# Patient Record
Sex: Female | Born: 1985 | Race: Black or African American | Hispanic: No | Marital: Single | Smoking: Never smoker
Health system: Southern US, Community
[De-identification: ages and names within clinical notes are randomized; demographics above are authoritative.]

## PROBLEM LIST (undated history)

## (undated) DIAGNOSIS — K219 Gastro-esophageal reflux disease without esophagitis: Secondary | ICD-10-CM

## (undated) DIAGNOSIS — O1420 HELLP syndrome (HELLP), unspecified trimester: Secondary | ICD-10-CM

## (undated) DIAGNOSIS — F32A Depression, unspecified: Secondary | ICD-10-CM

## (undated) DIAGNOSIS — T7840XA Allergy, unspecified, initial encounter: Secondary | ICD-10-CM

## (undated) DIAGNOSIS — J45909 Unspecified asthma, uncomplicated: Secondary | ICD-10-CM

## (undated) DIAGNOSIS — G4730 Sleep apnea, unspecified: Secondary | ICD-10-CM

## (undated) DIAGNOSIS — F419 Anxiety disorder, unspecified: Secondary | ICD-10-CM

## (undated) DIAGNOSIS — E282 Polycystic ovarian syndrome: Secondary | ICD-10-CM

## (undated) HISTORY — DX: Polycystic ovarian syndrome: E28.2

## (undated) HISTORY — PX: EYE SURGERY: SHX253

## (undated) HISTORY — PX: WISDOM TOOTH EXTRACTION: SHX21

## (undated) HISTORY — PX: BRAIN SURGERY: SHX531

## (undated) HISTORY — DX: Allergy, unspecified, initial encounter: T78.40XA

## (undated) HISTORY — DX: Depression, unspecified: F32.A

## (undated) HISTORY — DX: Anxiety disorder, unspecified: F41.9

---

## 2011-06-25 ENCOUNTER — Ambulatory Visit (INDEPENDENT_AMBULATORY_CARE_PROVIDER_SITE_OTHER): Payer: Managed Care, Other (non HMO) | Admitting: Family Medicine

## 2011-06-25 VITALS — BP 98/61 | HR 49 | Temp 98.8°F | Resp 16 | Ht 62.75 in | Wt 151.4 lb

## 2011-06-25 DIAGNOSIS — IMO0002 Reserved for concepts with insufficient information to code with codable children: Secondary | ICD-10-CM

## 2011-06-25 DIAGNOSIS — B9689 Other specified bacterial agents as the cause of diseases classified elsewhere: Secondary | ICD-10-CM

## 2011-06-25 DIAGNOSIS — Z3009 Encounter for other general counseling and advice on contraception: Secondary | ICD-10-CM

## 2011-06-25 DIAGNOSIS — N760 Acute vaginitis: Secondary | ICD-10-CM

## 2011-06-25 DIAGNOSIS — Z0000 Encounter for general adult medical examination without abnormal findings: Secondary | ICD-10-CM

## 2011-06-25 DIAGNOSIS — Z711 Person with feared health complaint in whom no diagnosis is made: Secondary | ICD-10-CM

## 2011-06-25 LAB — POCT URINALYSIS DIPSTICK
Bilirubin, UA: NEGATIVE
Blood, UA: NEGATIVE
Glucose, UA: NEGATIVE
Ketones, UA: NEGATIVE
Leukocytes, UA: NEGATIVE
Nitrite, UA: NEGATIVE
Protein, UA: NEGATIVE
Spec Grav, UA: 1.01
Urobilinogen, UA: 0.2
pH, UA: 5.5

## 2011-06-25 LAB — POCT WET PREP WITH KOH
Clue Cells Wet Prep HPF POC: 100
KOH Prep POC: NEGATIVE
Trichomonas, UA: NEGATIVE
Yeast Wet Prep HPF POC: NEGATIVE

## 2011-06-25 LAB — POCT UA - MICROSCOPIC ONLY
Bacteria, U Microscopic: NEGATIVE
Casts, Ur, LPF, POC: NEGATIVE
Crystals, Ur, HPF, POC: NEGATIVE
Mucus, UA: NEGATIVE
RBC, urine, microscopic: NEGATIVE
Yeast, UA: NEGATIVE

## 2011-06-25 LAB — HIV ANTIBODY (ROUTINE TESTING W REFLEX): HIV: NONREACTIVE

## 2011-06-25 MED ORDER — METRONIDAZOLE 500 MG PO TABS: ORAL_TABLET | ORAL | Status: DC

## 2011-06-25 MED ORDER — DROSPIRENONE-ETHINYL ESTRADIOL 3-0.03 MG PO TABS: 1.0000 | ORAL_TABLET | Freq: Every day | ORAL | Status: DC

## 2011-06-25 NOTE — Progress Notes (Signed)
History :Patient is here for a complete physical. She has no major acute medical complaints at this time, just felt it was time to get a physical examination. 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 She has no major acute complaints. She does want birth-control pills.  Past family and social history recorded.  Review of systems: Gen.: Mildly overweight female in no acute distress this time. HEENT negative. Cardiovascular unremarkable. Respiratory unremarkable. GI unremarkable. GU unremarkable. Musci skelter markable. Neurologic unremarkable. Dermatologic unremarkable.  Physical examination: HEENT TMs normal eyes PERRLA throat clear has a pierced tongue. Next without nodes or thyromegaly. No carotid bruits. Chest clear. Breasts are symmetrical soft without any masses. No axillary or inguinal nodes. Chest clear to auscultation. Heart regular without murmurs gallops arrhythmias. Evidence of organomegaly mass or tenderness. Extremities are without edema. Pelvic examination: Normal external genitalia. Vaginal mucosa unremarkable. Cervix appears benign. Bimanual exam reveals no adnexal uterine masses.  Assessment: Normal physical examination STD risks Oral contraception  Plan: Paps 3  Results for orders placed in visit on 06/25/11  POCT UA - MICROSCOPIC ONLY      Component Value Range   WBC, Ur, HPF, POC 0-1     RBC, urine, microscopic neg     Bacteria, U Microscopic neg     Mucus, UA neg     Epithelial cells, urine per micros 0-1     Crystals, Ur, HPF, POC neg     Casts, Ur, LPF, POC neg     Yeast, UA neg    POCT URINALYSIS DIPSTICK      Component Value Range   Color, UA yellow     Clarity, UA clear     Glucose, UA neg     Bilirubin, UA neg     Ketones, UA neg     Spec Grav, UA 1.010     Blood, UA neg     pH, UA 5.5     Protein, UA neg     Urobilinogen, UA 0.2     Nitrite, UA neg     Leukocytes, UA Negative    POCT WET PREP WITH KOH      Component Value Range   Trichomonas, UA Negative     Clue Cells Wet Prep HPF POC 100%     Epithelial Wet Prep HPF POC 6-10     Yeast Wet Prep HPF POC neg     Bacteria Wet Prep HPF POC 2+     RBC Wet Prep HPF POC 0-1     WBC Wet Prep HPF POC 3-5     KOH Prep POC Negative     v

## 2011-06-26 LAB — RPR

## 2011-06-27 ENCOUNTER — Encounter (INDEPENDENT_AMBULATORY_CARE_PROVIDER_SITE_OTHER): Payer: Managed Care, Other (non HMO)

## 2011-06-27 DIAGNOSIS — Z0389 Encounter for observation for other suspected diseases and conditions ruled out: Secondary | ICD-10-CM

## 2011-06-29 LAB — PAP IG, CT-NG, RFX HPV ASCU
Chlamydia Probe Amp: NEGATIVE
GC Probe Amp: NEGATIVE

## 2011-07-02 LAB — TB SKIN TEST
Induration: 0
TB Skin Test: NEGATIVE mm

## 2011-08-22 ENCOUNTER — Ambulatory Visit: Payer: Managed Care, Other (non HMO) | Admitting: Family Medicine

## 2011-08-22 VITALS — BP 122/75 | HR 77 | Temp 98.5°F | Resp 16 | Ht 63.0 in | Wt 146.8 lb

## 2011-08-22 DIAGNOSIS — F341 Dysthymic disorder: Secondary | ICD-10-CM

## 2011-08-22 DIAGNOSIS — R635 Abnormal weight gain: Secondary | ICD-10-CM

## 2011-08-22 DIAGNOSIS — F418 Other specified anxiety disorders: Secondary | ICD-10-CM

## 2011-08-22 LAB — TSH: TSH: 1.049 u[IU]/mL (ref 0.350–4.500)

## 2011-08-22 MED ORDER — LORAZEPAM 0.5 MG PO TABS: ORAL_TABLET | ORAL | Status: AC

## 2011-08-22 MED ORDER — SERTRALINE HCL 25 MG PO TABS: ORAL_TABLET | ORAL | Status: DC

## 2011-08-22 NOTE — Progress Notes (Signed)
[facility]:  Office Visit  Chief Complaint:  Chief Complaint  Patient presents with  . Depression    referred by therapist.  Dr. Clarisse Gouge    HPI: Brenda Molina is a 26 y.o. female who complains of: Depression x 3 months, with worsening sxs More irritable, more crying Insomnia  ( sleeps intermittently), thinking about a lot of stuff Stressors at work, at school, and with home life. She feels responsible for her  45 and 26 y/o borthers living with her mom in [place], she recently lost a older brother who helping out with that situation; she works for [employer] and is stressed at work, she is also going to [employer].  Sees Dr. Loleta Chance ( next appt q2 weeks) Anxiety component Denies prior psych issues, denies SI/HI/hallucintations   Past Medical History  Diagnosis Date  . PCOS (polycystic ovarian syndrome)    History reviewed. No pertinent past surgical history. History   Social History  . Marital Status: Single    Spouse Name: N/A    Number of Children: N/A  . Years of Education: N/A   Social History Main Topics  . Smoking status: Never Smoker   . Smokeless tobacco: None  . Alcohol Use: No  . Drug Use: No  . Sexually Active: Yes -- Female partner(s)    Birth Control/ Protection: Condom   Other Topics Concern  . None   Social History Narrative   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 Has no children. 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 Asthma Father   . Hypertension Father    No Known Allergies Prior to Admission medications   Medication Sig Start Date End Date Taking? Authorizing Provider  drospirenone-ethinyl estradiol (YAZ,GIANVI,LORYNA) 3-0.02 MG tablet Take 1 tablet by mouth daily.   Yes Historical Provider, MD     ROS: The patient denies fevers, chills, night sweats, unintentional weight loss, chest pain, palpitations, wheezing,  dyspnea on exertion, nausea, vomiting, abdominal pain, dysuria, hematuria, melena, numbness, weakness, or tingling.   All other systems have been reviewed and were otherwise negative with the exception of those mentioned in the HPI and as above.    PHYSICAL EXAM: Filed Vitals:   08/22/11 0830  BP: 122/75  Pulse: 77  Temp: 98.5 F (36.9 C)  Resp: 16   Filed Vitals:   08/22/11 0830  Height: 5\' 3"  (1.6 m)  Weight: 146 lb 12.8 oz (66.588 kg)   Body mass index is 26.00 kg/(m^2).  General: Alert, extremely tearful HEENT:  Normocephalic, atraumatic, oropharynx patent.  Cardiovascular:  Regular rate and rhythm, no rubs murmurs or gallops.  No Carotid bruits, radial pulse intact. No pedal edema.  Respiratory: Clear to auscultation bilaterally.  No wheezes, rales, or rhonchi.  No cyanosis, no use of accessory musculature GI: No organomegaly, abdomen is soft and non-tender, positive bowel sounds.  No masses. Skin: No rashes. Neurologic: Facial musculature symmetric. Psychiatric: Patient is appropriate throughout our interaction. Lymphatic: No cervical lymphadenopathy Musculoskeletal: Gait intact.   LABS:    EKG/XRAY:   Primary read interpreted by Dr. Conley Rolls at [facility].   ASSESSMENT/PLAN: Encounter Diagnoses  Name Primary?  . Depression with anxiety Yes  . Weight gain    Patient feels overwhelmed with work, life stressors.   1. Becks Depression Score 23 ( Moderate clinical depression); Zung anxiety index 51 ( minimal -moderate  anxiety) Will start patient on Zoloft 25 mg daily and then recheck in 4-6 weeks, Ativan 0.50 mg take 1/2 tab prn BID as needed for anxiety/insomnia given  C/w therapy with Dr. Shawnie Pons,   2. Check TSH  3. No thoughts of SI/Hi. Advise to go to ER if having any thoughts of hurting self/others.     Sanel Stemmer PHUONG, DO 08/22/2011 10:29 AM

## 2011-08-31 ENCOUNTER — Encounter: Payer: Self-pay | Admitting: Family Medicine

## 2011-11-30 ENCOUNTER — Ambulatory Visit (INDEPENDENT_AMBULATORY_CARE_PROVIDER_SITE_OTHER): Payer: Managed Care, Other (non HMO) | Admitting: Family Medicine

## 2011-11-30 VITALS — BP 118/84 | HR 88 | Temp 98.2°F | Resp 12 | Ht 63.5 in | Wt 150.2 lb

## 2011-11-30 DIAGNOSIS — H109 Unspecified conjunctivitis: Secondary | ICD-10-CM

## 2011-11-30 MED ORDER — TOBRAMYCIN 0.3 % OP SOLN: 1.0000 [drp] | Freq: Four times a day (QID) | OPHTHALMIC | Status: AC

## 2011-11-30 NOTE — Progress Notes (Signed)
26 yo woman with 2 days of right eye mattering and redness.  She tried OTC drops.  She works for [employer] and was up in [place], [place] visiting  No contacts.  Objective:  NAD EOMI PERLA Conjunctiva red on right with cobblestoning  Assessment: conjunctivitis OD 1. Conjunctivitis  tobramycin (TOBREX) 0.3 % ophthalmic solution

## 2011-11-30 NOTE — Patient Instructions (Addendum)
Conjunctivitis Conjunctivitis is commonly called "pink eye." Conjunctivitis can be caused by bacterial or viral infection, allergies, or injuries. There is usually redness of the lining of the eye, itching, discomfort, and sometimes discharge. There may be deposits of matter along the eyelids. A viral infection usually causes a watery discharge, while a bacterial infection causes a yellowish, thick discharge. Pink eye is very contagious and spreads by direct contact. You may be given antibiotic eyedrops as part of your treatment. Before using your eye medicine, remove all drainage from the eye by washing gently with warm water and cotton balls. Continue to use the medication until you have awakened 2 mornings in a row without discharge from the eye. Do not rub your eye. This increases the irritation and helps spread infection. Use separate towels from other household members. Wash your hands with soap and water before and after touching your eyes. Use cold compresses to reduce pain and sunglasses to relieve irritation from light. Do not wear contact lenses or wear eye makeup until the infection is gone. SEEK MEDICAL CARE IF:   Your symptoms are not better after 3 days of treatment.   You have increased pain or trouble seeing.   The outer eyelids become very red or swollen.  Document Released: 05/21/2004 Document Revised: 04/02/2011 Document Reviewed: 04/13/2005 [employer] Patient Information 2012 [employer]. 

## 2012-01-30 ENCOUNTER — Ambulatory Visit: Payer: Managed Care, Other (non HMO) | Admitting: Physician Assistant

## 2012-01-30 VITALS — BP 104/66 | HR 79 | Temp 98.1°F | Resp 16 | Ht 63.0 in | Wt 147.8 lb

## 2012-01-30 DIAGNOSIS — Z113 Encounter for screening for infections with a predominantly sexual mode of transmission: Secondary | ICD-10-CM

## 2012-01-30 DIAGNOSIS — N898 Other specified noninflammatory disorders of vagina: Secondary | ICD-10-CM

## 2012-01-30 LAB — POCT WET PREP WITH KOH
Clue Cells Wet Prep HPF POC: 100
KOH Prep POC: NEGATIVE
Trichomonas, UA: NEGATIVE
Yeast Wet Prep HPF POC: NEGATIVE

## 2012-01-30 MED ORDER — FLUCONAZOLE 150 MG PO TABS: 150.0000 mg | ORAL_TABLET | Freq: Once | ORAL | Status: DC

## 2012-01-30 MED ORDER — METRONIDAZOLE 500 MG PO TABS: 500.0000 mg | ORAL_TABLET | Freq: Two times a day (BID) | ORAL | Status: DC

## 2012-01-30 NOTE — Progress Notes (Signed)
  Subjective:    Patient ID: Brenda Molina, female    DOB: 18-Sep-1985, 26 y.o.   MRN: [id number]  HPI 26 year old female presents for STD screening and testing for possible yeast infection.  She has no specific concerns but wants to be tested. She is sexually active with 1 female partner. He is in [place] and she has returned from a recent visit.  Does have a vaginal discharge but no odor, burning, or itching.  Also concerned about a "spot" in her vaginal area that has been irritated.  Denies abdominal pain, nausea, vomiting, dysuria, or urinary frequency.  She is on OCP's for contraception.     Review of Systems  Constitutional: Negative for fever and chills.  Gastrointestinal: Negative for nausea, vomiting and abdominal pain.  Genitourinary: Positive for vaginal discharge. Negative for dysuria, urgency, vaginal bleeding, vaginal pain and pelvic pain.  Skin: Positive for rash.  All other systems reviewed and are negative.       Objective:   Physical Exam  Constitutional: She is oriented to person, place, and time. She appears well-developed and well-nourished.  HENT:  Head: Normocephalic and atraumatic.  Right Ear: External ear normal.  Left Ear: External ear normal.  Eyes: Conjunctivae normal are normal.  Neck: Normal range of motion.  Cardiovascular: Normal rate, regular rhythm and normal heart sounds.   Pulmonary/Chest: Effort normal and breath sounds normal.  Abdominal: Soft. Bowel sounds are normal. There is no tenderness. There is no rebound and no guarding.  Genitourinary: Vagina normal and uterus normal. Pelvic exam was performed with patient supine. There is no rash, tenderness or lesion on the right labia. There is no rash, tenderness or lesion on the left labia. Cervix exhibits discharge (white). Cervix exhibits no motion tenderness. Right adnexum displays no tenderness and no fullness. Left adnexum displays no tenderness and no fullness.       Small, <0.5 cm area of irritation.  No ulceration, tenderness, or discharge.    Musculoskeletal: Normal range of motion.  Lymphadenopathy:       Right: No inguinal adenopathy present.       Left: No inguinal adenopathy present.  Neurological: She is alert and oriented to person, place, and time.  Psychiatric: She has a normal mood and affect. Her behavior is normal. Judgment and thought content normal.     Results for orders placed in visit on 01/30/12  POCT WET PREP WITH KOH      Component Value Range   Trichomonas, UA Negative     Clue Cells Wet Prep HPF POC 100%     Epithelial Wet Prep HPF POC 5-15     Yeast Wet Prep HPF POC neg     Bacteria Wet Prep HPF POC 4+     RBC Wet Prep HPF POC 0-1     WBC Wet Prep HPF POC 0-5     KOH Prep POC Negative          Assessment & Plan:   1. Leukorrhea  POCT Wet Prep with KOH, GC/chlamydia probe amp, genital  2. Screening for venereal disease  GC/chlamydia probe amp, genital   Flagyl 500 mg bid x 7 days GC/CL sent Diflucan to take if needed after completion of flagyl

## 2012-02-01 LAB — GC/CHLAMYDIA PROBE AMP, GENITAL
Chlamydia, DNA Probe: NEGATIVE
GC Probe Amp, Genital: NEGATIVE

## 2012-07-18 ENCOUNTER — Other Ambulatory Visit: Payer: Self-pay | Admitting: Family Medicine

## 2012-09-22 ENCOUNTER — Ambulatory Visit (INDEPENDENT_AMBULATORY_CARE_PROVIDER_SITE_OTHER): Payer: Managed Care, Other (non HMO) | Admitting: Family Medicine

## 2012-09-22 VITALS — BP 127/71 | HR 66 | Temp 98.0°F | Resp 16 | Ht 63.0 in | Wt 157.0 lb

## 2012-09-22 DIAGNOSIS — M2142 Flat foot [pes planus] (acquired), left foot: Secondary | ICD-10-CM

## 2012-09-22 DIAGNOSIS — M2140 Flat foot [pes planus] (acquired), unspecified foot: Secondary | ICD-10-CM

## 2012-09-22 DIAGNOSIS — G5752 Tarsal tunnel syndrome, left lower limb: Secondary | ICD-10-CM

## 2012-09-22 DIAGNOSIS — G5750 Tarsal tunnel syndrome, unspecified lower limb: Secondary | ICD-10-CM

## 2012-09-22 NOTE — Patient Instructions (Signed)
Give them a call at Sports Medicine at [facility].   The address and number are:  [address] [place], [place] [id number]  It was good to meet you!  Tarsal Tunnel Syndrome with Rehab Tarsal tunnel syndrome is a condition that involves pressure (compression) on the nerve in the ankle (posterior tibial nerve) and results in pain and loss of feeling on the bottom of the foot. The nerve is usually compressed by other structures within the ankle. SYMPTOMS   Signs of nerve damage: pain, numbness, tingling, and loss of feeling along the bottom of the foot.  Pain that worsens with activity.  Feeling a lack of stability in the ankle. CAUSES  Tarsal tunnel syndrome is caused by structures within the ankle placing pressure on the nerve inside the ankle, which causes sensations in the bottom of the foot. Common sources of pressure include:  Ligament-like tissue (retinaculum) that covers the nerve area in the ankle (tarsal tunnel).  Bony spurs or bumps.  Inflamed tendons (tendonitis). RISK INCREASES WITH:  Stretched ankle ligaments, which create a loose joint.  Flat feet.  Arthritis of the ankle.  Inflammation of tendons in the foot and ankle.  Previous foot or ankle injury. PREVENTION  Warm up and stretch properly before activity.  Maintain physical fitness:  Strength, flexibility, and endurance.  Cardiovascular fitness (increases heart rate).  Wear properly fitted shoes.  Wear arch supports (orthotics), if you have flat feet.  Protect the ankle with taping, braces, or compression bandages. PROGNOSIS  If treated properly, the symptoms of tarsal tunnel syndrome usually go away with non-surgical treatment. Occasionally, surgery is necessary to free the compressed nerve.  RELATED COMPLICATIONS  Permanent nerve damage, including pain, numbness, tingling, or weakness in the ankle. TREATMENT Treatment first involves resting from any activities that aggravate the symptoms, as  well as the use of ice and medicine to reduce pain and inflammation. The use of strengthening and stretching exercises may help reduce pain from activity. Other treatments include wearing arch supports, if you have flat feet, and cross training (training in various physical activities) to reduce stress on the foot and ankle. If symptoms persist, despite non-surgical treatment, then surgery may be recommended. Surgery usually provides full relief from symptoms.  MEDICATION   If pain medicine is necessary, nonsteroidal anti-inflammatory medicines (aspirin and ibuprofen), or other minor pain relievers (acetaminophen), are often recommended.  Do not take pain medication for 7 days before surgery.  Prescription pain relievers may be given if your caregiver thinks they are necessary. Use only as directed and only as much as you need. HEAT AND COLD  Cold treatment (icing) relieves pain and reduces inflammation. Cold treatment should be applied for 10 to 15 minutes every 2 to 3 hours, and immediately after any activity that aggravates your symptoms. Use ice packs or an ice massage.  Heat treatment may be used prior to performing stretching and strengthening activities prescribed by your caregiver, physical therapist, or athletic trainer. Use a heat pack or a warm water soak. SEEK MEDICAL CARE IF:  Treatment does not seem to help, or the condition worsens.  Any medicines produce negative side effects.  Any complications from surgery occur:  Pain, numbness, or coldness in the affected foot.  Discoloration beneath the toenails (blue or gray) of the affected foot.  Signs of infections (fever, pain, inflammation, redness, or persistent bleeding). EXERCISES RANGE OF MOTION (ROM) AND STRETCHING EXERCISES - Tarsal Tunnel Syndrome (Posterior Tibial Nerve Compression) These exercises may help you  when beginning to restore activity to your injured foot. Complete these exercises with caution. Nerves are very  sensitive tissue. They must be exercised gently. Never force a motion and do not push through discomfort. Notify your caregiver of any exercises which increase your pain or worsen your symptoms. Your symptoms may go away with or without further involvement from your physician, physical therapist or athletic trainer. While completing these exercises, remember:   Restoring tissue flexibility helps normal motion to return to the joints. This allows healthier, less painful movement and activity.  An effective stretch should be held for at least 30 seconds.  A stretch should never be painful. You should only feel a gentle lengthening or release in the stretched tissue. STRETCH  Gastroc, Standing  Place hands on wall.  Extend right / left leg behind you and place a folded washcloth under the arch of your foot for support. Keep the front knee somewhat bent.  Slightly point your toes inward on your back foot.  Keeping your right / left heel on the floor and your knee straight, shift your weight toward the wall, not allowing your back to arch.  You should feel a gentle stretch in the right / left calf. Hold this position for __________ seconds. Repeat __________ times. Complete this stretch __________ times per day. STRETCH  Soleus, Standing  Place hands on wall.  Extend right / left leg behind you and place a folded washcloth under the arch of your foot for support. Keep the front knee somewhat bent.  Slightly point your toes inward on your back foot.  Keep your right / left heel on the floor, bend your back knee, and slightly shift your weight over the back leg so that you feel a gentle stretch deep in your back calf.  Hold this position for __________ seconds. Repeat __________ times. Complete this stretch __________ times per day. RANGE OF MOTION - Toe Extension, Flexion  Sit with your right / left leg crossed over your opposite knee.  Grasp your toes and gently pull them back toward  the top of your foot. You should feel a stretch on the bottom of your toes and foot.  Hold this stretch for __________ seconds.  Now, gently pull your toes toward the bottom of your foot. You should feel a stretch on the top of your toes and foot.  Hold this stretch for __________ seconds. Repeat __________ times. Complete this stretch __________ times per day.  RANGE OF MOTION - Ankle Eversion  Sit with your right / left ankle crossed over your opposite knee.  Grip your foot with your opposite hand, placing your thumb on the top of your foot and your fingers across the bottom of your foot.  Gently push your foot downward with a slight rotation so your littlest toes rise slightly toward the ceiling.  You should feel a gentle stretch on the inside of your ankle. Hold the stretch for __________ seconds. Repeat __________ times. Complete this exercise __________ times per day.  RANGE OF MOTION - Ankle Dorsiflexion, Active Assisted  Remove shoes and sit on a chair, preferably not on a carpeted surface.  Place right / left foot on the floor, directly under knee. Extend your opposite leg for support.  Keeping your heel down, slide your right / left foot back toward the chair until you feel a stretch at your ankle or calf. If you do not feel a stretch, slide your bottom forward to the edge of the chair while  still keeping your heel down.  Hold this stretch for __________ seconds. Repeat __________ times. Complete this stretch __________ times per day.  STRETCH  Hamstrings, Supine  Lie on your back. Loop a belt or towel over the ball of your right / left foot.  Straighten your right / left knee and slowly pull on the belt to raise your leg. Do not allow the right / left knee to bend. Keep your opposite leg flat on the floor.  Raise the leg until you feel a gentle stretch behind your right / left knee or thigh. Hold this position for __________ seconds. Repeat __________ times. Complete  this stretch __________ times per day.  STRETCH - Hamstrings, Doorway  Lie on your back with your right / left leg extended and resting on the wall and the opposite leg flat on the ground through the door. Initially, position your bottom farther away from the wall than the illustration shows.  Keep your right / left knee straight. If you feel a stretch behind your knee or thigh, hold this position for __________ seconds.  If you do not feel a stretch, scoot your bottom closer to the door and hold __________ seconds. Repeat __________ times. Complete this stretch __________ times per day.  STRETCH - Hamstrings, Standing  Stand or sit, and extend your right / left leg, placing your foot on a chair or foot stool  Keep a slight arch in your low back, and keep your hips straight forward.  Lead with your chest and lean forward at the waist, until you feel a gentle stretch in the back of your right / left knee or thigh. (When done correctly, this exercise requires leaning only a small distance.)  Hold this position for __________ seconds. Repeat __________ times. Complete this stretch __________ times per day. STRENGTHENING EXERCISES - Tarsal Tunnel Syndrome (Posterior Tibial Nerve Compression) These exercises may help you when beginning to restore activity to your injured foot. Your symptoms may go away with or without further involvement from your physician, physical therapist or athletic trainer. While completing these exercises, remember:   Muscles can gain both the endurance and the strength needed for everyday activities through controlled exercises.  Complete these exercises as instructed by your physician, physical therapist or athletic trainer. Increase the resistance and repetitions only as guided.  You may experience muscle soreness or fatigue, but the pain or discomfort you are trying to eliminate should never worsen during these exercises. If this pain does worsen, stop and make  certain you are following the directions exactly. If the pain is still present after adjustments, discontinue the exercise until you can discuss the trouble with your caregiver. STRENGTH - Dorsiflexors  Secure a rubber exercise band or tubing to a fixed object (table, pole) and loop the other end around your right / left foot.  Sit on the floor facing the fixed object. The band should be slightly tense when your foot is relaxed.  Slowly draw your foot back toward you, using your ankle and toes.  Hold this position for __________ seconds. Slowly release the tension in the band and return your foot to the starting position. Repeat __________ times. Complete this exercise __________ times per day.  STRENGTH - Plantar-flexors  Sit with your right / left leg extended. Holding onto both ends of a rubber exercise band or tubing, loop it around the ball of your foot. Keep a slight tension in the band.  Slowly push your toes away from you, pointing them  downward.  Hold this position for __________ seconds. Return to the starting position slowly, controlling the tension in the band. Repeat __________ times. Complete this exercise __________ times per day.  STRENGTH - Plantar-flexors, Standing  Stand with your feet shoulder width apart. Place your hands on a wall or table to steady yourself, using as little support as needed.  Keeping your weight evenly spread over the width of your feet, rise up on your toes.*  Hold this position for __________ seconds. Repeat __________ times. Complete this exercise __________ times per day.  *If this is too easy, shift your weight toward your right / left leg until you feel challenged. Ultimately, you may be asked to do this exercise while standing on your right / left foot only. STRENGTH - Towel Curls  Sit in a chair, on a non-carpeted surface.  Place your foot on a towel, keeping your heel on the floor.  Pull the towel toward your heel only by curling  your toes. Keep your heel on the floor.  If instructed by your physician, physical therapist or athletic trainer, add ____________________ at the end of the towel. Repeat __________ times. Complete this exercise __________ times per day. STRENGTH - Ankle Eversion  Secure one end of a rubber exercise band or tubing to a fixed object (table, pole). Loop the other end around your foot, just before your toes.  Place your fists between your knees. This will focus your strengthening at your ankle.  Drawing the band across your opposite foot, away from the pole, slowly pull your little toe out and up. Make sure the band is positioned to resist the entire motion.  Hold this position for __________ seconds.  Return to the starting position slowly, controlling the tension in the band. Repeat __________ times. Complete this exercise __________ times per day.  STRENGTH - Ankle Inversion  Secure one end of a rubber exercise band or tubing to a fixed object (table, pole). Loop the other end around your foot, just before your toes.  Place your fists between your knees. This will focus your strengthening at your ankle.  Slowly, pull your big toe up and in, making sure the band is positioned to resist the entire motion.  Hold this position for __________ seconds.  Return to the starting position slowly, controlling the tension in the band. Repeat __________ times. Complete this exercises __________ times per day.  Document Released: 04/13/2005 Document Revised: 07/06/2011 Document Reviewed: 07/26/2008 [facility] Patient Information 2014 [place], [place].

## 2012-09-22 NOTE — Progress Notes (Signed)
Brenda Molina is a 27 y.o. female who presents to Urgent Care today with complaints of Left foot pain and numbness:  1.  Foot pain/numbness:  Present for a little over 2 weeks.  Left foot.  She has been going through a running program for past several weeks but this is new condition for her.  She spends most of her time barefoot as she works from home and when she goes out wears "flats."  Only time wearing shoes with any support is when she runs.    Describes pain extending from medial aspect of foot, directly under arch.  Worse when walking/bearing weight.  Numbness extends from mid-foot to ball of foot.  This is also worse when bearing weight or foot eversion.    No trauma or direct injury to foot.    PMH reviewed.  Past Medical History  Diagnosis Date  . PCOS (polycystic ovarian syndrome)    No past surgical history on file.  Medications reviewed. No current outpatient prescriptions on file.   No current facility-administered medications for this visit.    ROS as above otherwise neg.  No chest pain, palpitations, SOB, Fever, Chills, Abd pain, N/V/D.   Physical Exam:  BP 127/71  Pulse 66  Temp(Src) 98 F (36.7 C) (Oral)  Resp 16  Ht 5\' 3"  (1.6 m)  Wt 157 lb (71.215 kg)  BMI 27.82 kg/m2  SpO2 99%  LMP 09/19/2012 Gen:  Alert, cooperative patient who appears stated age in no acute distress.  Vital signs reviewed. Ext:  Both arches have diminished, Left greater than right, when standing.  Other than this, Right foot is WNL.  Left foot is tender throughout most of arch.  Not tender over calcaneus.  No Achilles tenderness.  Minimally decreased sensation along arch.  Pain with inversion and eversion of foot, mostly at arch.    Assessment and Plan:  1.  Tarsal tunnel syndrome:  - REcommended to try OTC inserts and use this when wearing shoes - Recommended she use shoes as some support for her arch when walking around at home - I have provided several exercises for her - FU with  Sports Med in 2 weeks if no improvement.

## 2012-11-17 ENCOUNTER — Ambulatory Visit (INDEPENDENT_AMBULATORY_CARE_PROVIDER_SITE_OTHER): Payer: Managed Care, Other (non HMO) | Admitting: Family Medicine

## 2012-11-17 VITALS — BP 112/68 | HR 70 | Temp 97.9°F | Resp 18 | Ht 64.0 in | Wt 164.8 lb

## 2012-11-17 DIAGNOSIS — IMO0001 Reserved for inherently not codable concepts without codable children: Secondary | ICD-10-CM

## 2012-11-17 DIAGNOSIS — R61 Generalized hyperhidrosis: Secondary | ICD-10-CM

## 2012-11-17 DIAGNOSIS — Z8742 Personal history of other diseases of the female genital tract: Secondary | ICD-10-CM

## 2012-11-17 DIAGNOSIS — Z309 Encounter for contraceptive management, unspecified: Secondary | ICD-10-CM

## 2012-11-17 DIAGNOSIS — Z0000 Encounter for general adult medical examination without abnormal findings: Secondary | ICD-10-CM

## 2012-11-17 DIAGNOSIS — R635 Abnormal weight gain: Secondary | ICD-10-CM

## 2012-11-17 LAB — POCT CBC
Granulocyte percent: 62.8 %G (ref 37–80)
HCT, POC: 44 % (ref 37.7–47.9)
Hemoglobin: 14 g/dL (ref 12.2–16.2)
Lymph, poc: 2.2 (ref 0.6–3.4)
MCH, POC: 26.3 pg — AB (ref 27–31.2)
MCHC: 31.8 g/dL (ref 31.8–35.4)
MCV: 82.5 fL (ref 80–97)
MID (cbc): 0.6 (ref 0–0.9)
MPV: 9.2 fL (ref 0–99.8)
POC Granulocyte: 4.6 (ref 2–6.9)
POC LYMPH PERCENT: 29.7 %L (ref 10–50)
POC MID %: 7.5 %M (ref 0–12)
Platelet Count, POC: 204 10*3/uL (ref 142–424)
RBC: 5.33 M/uL (ref 4.04–5.48)
RDW, POC: 13.7 %
WBC: 7.4 10*3/uL (ref 4.6–10.2)

## 2012-11-17 LAB — COMPREHENSIVE METABOLIC PANEL
ALT: 21 U/L (ref 0–35)
AST: 22 U/L (ref 0–37)
Albumin: 4.1 g/dL (ref 3.5–5.2)
Alkaline Phosphatase: 61 U/L (ref 39–117)
BUN: 20 mg/dL (ref 6–23)
CO2: 24 mEq/L (ref 19–32)
Calcium: 9.4 mg/dL (ref 8.4–10.5)
Chloride: 107 mEq/L (ref 96–112)
Creat: 1.1 mg/dL (ref 0.50–1.10)
Glucose, Bld: 93 mg/dL (ref 70–99)
Potassium: 4.2 mEq/L (ref 3.5–5.3)
Sodium: 140 mEq/L (ref 135–145)
Total Bilirubin: 0.4 mg/dL (ref 0.3–1.2)
Total Protein: 6.6 g/dL (ref 6.0–8.3)

## 2012-11-17 LAB — POCT WET PREP WITH KOH
KOH Prep POC: NEGATIVE
Trichomonas, UA: NEGATIVE
Yeast Wet Prep HPF POC: NEGATIVE

## 2012-11-17 LAB — HIV ANTIBODY (ROUTINE TESTING W REFLEX): HIV: NONREACTIVE

## 2012-11-17 LAB — HEPATITIS B SURFACE ANTIBODY, QUANTITATIVE: Hepatitis B-Post: 0 m[IU]/mL

## 2012-11-17 LAB — POCT URINE PREGNANCY: Preg Test, Ur: NEGATIVE

## 2012-11-17 LAB — HEPATITIS B SURFACE ANTIGEN: Hepatitis B Surface Ag: NEGATIVE

## 2012-11-17 LAB — RPR

## 2012-11-17 LAB — HEPATITIS C ANTIBODY: HCV Ab: NEGATIVE

## 2012-11-17 LAB — TSH: TSH: 1.003 u[IU]/mL (ref 0.350–4.500)

## 2012-11-17 MED ORDER — DROSPIRENONE-ETHINYL ESTRADIOL 3-0.02 MG PO TABS: 1.0000 | ORAL_TABLET | Freq: Every day | ORAL | Status: DC

## 2012-11-17 NOTE — Patient Instructions (Addendum)
You should receive a call or letter about your lab results within the next week to 10 days.  Restart the Yaz, and if night sweats or weight changes persist - return to discuss further. Return to the clinic or go to the nearest emergency room if any of your symptoms worsen or new symptoms occur.  Keeping You Healthy  Get These Tests 1. Blood Pressure- Have your blood pressure checked once a year by your health care provider.  Normal blood pressure is 120/80. 2. Weight- Have your body mass index (BMI) calculated to screen for obesity.  BMI is measure of body fat based on height and weight.  You can also calculate your own BMI at [email or web address].west-esparza.com/. 3. Cholesterol- Have your cholesterol checked every 5 years starting at age 59 then yearly starting at age 4. 4. Chlamydia, HIV, and other sexually transmitted diseases- Get screened every year until age 33, then within three months of each new sexual provider. 5. Pap Smear- Every 1-3 years; discuss with your health care provider. 6. Mammogram- Every year starting at age 17  Take these medicines  Calcium with Vitamin D-Your body needs 1200 mg of Calcium each day and [phone] IU of Vitamin D daily.  Your body can only absorb 500 mg of Calcium at a time so Calcium must be taken in 2 or 3 divided doses throughout the day.  Multivitamin with folic acid- Once daily if it is possible for you to become pregnant.  Get these Immunizations  Gardasil-Series of three doses; prevents HPV related illness such as genital warts and cervical cancer.  Menactra-Single dose; prevents meningitis.  Tetanus shot- Every 10 years.  Flu shot-Every year.  Take these steps 1. Do not smoke-Your healthcare provider can help you quit.  For tips on how to quit go to www.smokefree.gov or call 1-800 QUITNOW. 2. Be physically active- Exercise 5 days a week for at least 30 minutes.  If you are not already physically active, start slow and gradually work up to 30 minutes  of moderate physical activity.  Examples of moderate activity include walking briskly, dancing, swimming, bicycling, etc. 3. Breast Cancer- A self breast exam every month is important for early detection of breast cancer.  For more information and instruction on self breast exams, ask your healthcare provider or [email or web address]. 4. Eat a healthy diet- Eat a variety of healthy foods such as fruits, vegetables, whole grains, low fat milk, low fat cheeses, yogurt, lean meats, poultry and fish, beans, nuts, tofu, etc.  For more information go to www. Thenutritionsource.org 5. Drink alcohol in moderation- Limit alcohol intake to one drink or less per day. Never drink and drive. 6. Depression- Your emotional health is as important as your physical health.  If you're feeling down or losing interest in things you normally enjoy please talk to your healthcare provider about being screened for depression. 7. Dental visit- Brush and floss your teeth twice daily; visit your dentist twice a year. 8. Eye doctor- Get an eye exam at least every 2 years. 9. Helmet use- Always wear a helmet when riding a bicycle, motorcycle, rollerblading or skateboarding. 10. Safe sex- If you may be exposed to sexually transmitted infections, use a condom. 11. Seat belts- Seat belts can save your live; always wear one. 12. Smoke/Carbon Monoxide detectors- These detectors need to be installed on the appropriate level of your home. Replace batteries at least once a year. 13. Skin cancer- When out in the sun please cover up and use sunscreen 15  SPF or higher. 14. Violence- If anyone is threatening or hurting you, please tell your healthcare provider.  Oral Contraception Use Oral contraceptives (OCs) are medicines taken to prevent pregnancy. OCs work by preventing the ovaries from releasing eggs. The hormones in OCs also cause the cervical mucus to thicken, preventing the sperm from entering the uterus. The  hormones also cause the uterine lining to become thin, not allowing a fertilized egg to attach to the inside of the uterus. OCs are highly effective when taken exactly as prescribed. However, OCs do not prevent sexually transmitted diseases (STDs). Safe sex practices, such as using condoms along with an OC, can help prevent STDs.  Before taking OCs, you may have a physical exam and Pap test. Your caregiver may also order blood tests if necessary. Your caregiver will make sure you are a good candidate for oral contraception. Discuss with your caregiver the possible side effects of the OC you may be prescribed. When starting an OC, it can take 2 to 3 months for the body to adjust to the changes in hormone levels in your body.  HOW TO TAKE ORAL CONTRACEPTIVES Your caregiver may advise you on how to start taking the first cycle of OCs. Otherwise, you can:  Start on day 1 of your menstrual period. You will not need any backup contraceptive protection with this start time.  Start on the first Sunday after your menstrual period or the day you get your prescription. In these cases, you will need to use backup contraceptive protection for the first 7-day cycle. After you have started taking OCs:  If you forget to take 1 pill, take it as soon as you remember. Take the next pill at the regular time.  If you miss 2 or more pills, use backup birth control until your next menstrual period starts.  If you use a 28-day pack that contains inactive pills and you miss 1 of the last 7 pills (pills with no hormones), it will not matter. Throw away the rest of the non-hormone pills and start a new pill pack. No matter which day you start the OC, you will always start a new pack on that same day of the week. Have an extra pack of OCs and a backup contraceptive method available in case you miss some pills or lose your OC pack. HOME CARE INSTRUCTIONS   Do not smoke.  Always use a condom to protect against STDs. OCs do  not protect against STDs.  Use a calendar to mark your menstrual period days.  Read the information and directions that come with your OC. Talk to your caregiver if you have questions. SEEK MEDICAL CARE IF:   You develop nausea and vomiting.  You have abnormal vaginal discharge or bleeding.  You develop a rash.  You miss your menstrual period.  You are losing your hair.  You need treatment for mood swings or depression.  You get dizzy when taking the OC.  You develop acne from taking the OC.  You become pregnant. SEEK IMMEDIATE MEDICAL CARE IF:   You develop chest pain.  You develop shortness of breath.  You have an uncontrolled or severe headache.  You develop numbness or slurred speech.  You develop visual problems.  You develop pain, redness, and swelling in the legs. Document Released: 04/02/2011 Document Revised: 07/06/2011 Document Reviewed: 04/02/2011 [facility] Patient Information 2014 [place], [place].

## 2012-11-17 NOTE — Progress Notes (Signed)
Subjective:    Patient ID: Brenda Molina, female    DOB: 10-01-1985, 27 y.o.   MRN: [id number]  HPI Brenda Molina is a 27 y.o. female  Here for physical/annual exam.  Last po 6 hours ago. Will have some lab work done through work as well.  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 Dentist - last seen few months ago. optho - none. No glasses/contacts.   Would like to restart OCP's -  Feels like hormones off - past 2 months.  Night sweats at times, sometimes chills. No wt loss - gained 10 pounds past 2 months and has been exercising almost everyday, but off exercises some last month.  Has also tried to change eating habits.  Has taken Yaz in past - did ok with this.  Out of meds past 2 months. No unprotected intercourse: Sexually active - males only. Condoms everytime.  No hx of STI's. 1 sexual partner. Pap normal in 05/2011.  LNMP July 10th.  Hx of PCOS - diagnosed few years ago, seen by OBGyn prior - recommended OCP's.   HPV vaccine - has had in past.  Meningitis vaccine - has not had. Thinks has had hep B vaccine in past.   Review of Systems  Constitutional: Positive for diaphoresis and unexpected weight change. Negative for fever and chills.  Genitourinary: Negative for difficulty urinating and menstrual problem.   13 point review of systems per patient health survey noted.  Negative other than as indicated.     Objective:   Physical Exam  Nursing note and vitals reviewed. Constitutional: She is oriented to person, place, and time. She appears well-developed and well-nourished.  HENT:  Head: Normocephalic and atraumatic.  Right Ear: External ear normal.  Left Ear: External ear normal.  Mouth/Throat: Oropharynx is clear and moist.  Eyes: Conjunctivae are normal. Pupils are equal, round, and reactive to light.  Neck: Normal range of motion. Neck supple. No thyromegaly present.  Cardiovascular: Normal rate,  regular rhythm, normal heart sounds and intact distal pulses.   No murmur heard. Pulmonary/Chest: Effort normal and breath sounds normal. No respiratory distress. She has no wheezes.  Abdominal: Soft. Bowel sounds are normal. There is no tenderness.  Genitourinary: Vagina normal and uterus normal. Pelvic exam was performed with patient supine. There is no rash or lesion on the right labia. There is no rash or lesion on the left labia. Cervix exhibits no motion tenderness and no discharge. Right adnexum displays no mass and no tenderness. Left adnexum displays no mass and no tenderness. No bleeding around the vagina. No vaginal discharge found.  Musculoskeletal: Normal range of motion. She exhibits no edema and no tenderness.  Lymphadenopathy:    She has no cervical adenopathy.       Right: No inguinal adenopathy present.       Left: No inguinal adenopathy present.  Neurological: She is alert and oriented to person, place, and time.  Skin: Skin is warm and dry. No rash noted.  Psychiatric: She has a normal mood and affect. Her behavior is normal. Thought content normal.  vision noted - 20/25 OD, 20/25 OS, 20/20 OU.   Results for orders placed in visit on 11/17/12  POCT CBC      Result Value Range   WBC 7.4  4.6 - 10.2 K/uL   Lymph, poc 2.2  0.6 - 3.4   POC LYMPH PERCENT 29.7  10 - 50 %  L   MID (cbc) 0.6  0 - 0.9   POC MID % 7.5  0 - 12 %M   POC Granulocyte 4.6  2 - 6.9   Granulocyte percent 62.8  37 - 80 %G   RBC 5.33  4.04 - 5.48 M/uL   Hemoglobin 14.0  12.2 - 16.2 g/dL   HCT, POC 16.1  09.6 - 47.9 %   MCV 82.5  80 - 97 fL   MCH, POC 26.3 (*) 27 - 31.2 pg   MCHC 31.8  31.8 - 35.4 g/dL   RDW, POC 04.5     Platelet Count, POC 204  142 - 424 K/uL   MPV 9.2  0 - 99.8 fL  POCT URINE PREGNANCY      Result Value Range   Preg Test, Ur Negative    POCT WET PREP WITH KOH      Result Value Range   Trichomonas, UA Negative     Clue Cells Wet Prep HPF POC 1-4     Epithelial Wet Prep HPF  POC 2-18     Yeast Wet Prep HPF POC NEG     Bacteria Wet Prep HPF POC 3+     RBC Wet Prep HPF POC 0-2     WBC Wet Prep HPF POC 2-6     KOH Prep POC Negative         Assessment & Plan:  Brenda Molina is a 27 y.o. female  Annual physical exam - Plan: POCT CBC, Comprehensive metabolic panel, TSH, RPR, Hepatitis B surface antigen, Hepatitis B surface antibody, Hepatitis C antibody, HIV antibody, HSV(herpes simplex vrs) 1+2 ab-IgG, Pap IG, CT/NG w/ reflex HPV when ASC-U, POCT Wet Prep with KOH, drospirenone-ethinyl estradiol (YAZ,GIANVI,LORYNA) 3-0.02 MG tablet. Anticipatory guidance as below. Can check into her last td and other immunizations.   Contraceptive counseling, History of PCOS - restarted Yaz.    Weight gain, Night sweats - off exercises past month - will check TSH. Afebrile and nonfocal exam. rtc if fever or not improving with restarting OCP's. rtc precautions.   Meds ordered this encounter  Medications  . drospirenone-ethinyl estradiol (YAZ,GIANVI,LORYNA) 3-0.02 MG tablet    Sig: Take 1 tablet by mouth daily.    Dispense:  1 Package    Refill:  11   Patient Instructions  You should receive a call or letter about your lab results within the next week to 10 days.  Restart the Yaz, and if night sweats or weight changes persist - return to discuss further. Return to the clinic or go to the nearest emergency room if any of your symptoms worsen or new symptoms occur.  Keeping You Healthy  Get These Tests 1. Blood Pressure- Have your blood pressure checked once a year by your health care provider.  Normal blood pressure is 120/80. 2. Weight- Have your body mass index (BMI) calculated to screen for obesity.  BMI is measure of body fat based on height and weight.  You can also calculate your own BMI at [email or web address].west-esparza.com/. 3. Cholesterol- Have your cholesterol checked every 5 years starting at age 57 then yearly starting at age 19. 4. Chlamydia, HIV, and other sexually  transmitted diseases- Get screened every year until age 67, then within three months of each new sexual provider. 5. Pap Smear- Every 1-3 years; discuss with your health care provider. 6. Mammogram- Every year starting at age 20  Take these medicines  Calcium with Vitamin D-Your body needs 1200 mg of Calcium each  day and [phone] IU of Vitamin D daily.  Your body can only absorb 500 mg of Calcium at a time so Calcium must be taken in 2 or 3 divided doses throughout the day.  Multivitamin with folic acid- Once daily if it is possible for you to become pregnant.  Get these Immunizations  Gardasil-Series of three doses; prevents HPV related illness such as genital warts and cervical cancer.  Menactra-Single dose; prevents meningitis.  Tetanus shot- Every 10 years.  Flu shot-Every year.  Take these steps 1. Do not smoke-Your healthcare provider can help you quit.  For tips on how to quit go to www.smokefree.gov or call 1-800 QUITNOW. 2. Be physically active- Exercise 5 days a week for at least 30 minutes.  If you are not already physically active, start slow and gradually work up to 30 minutes of moderate physical activity.  Examples of moderate activity include walking briskly, dancing, swimming, bicycling, etc. 3. Breast Cancer- A self breast exam every month is important for early detection of breast cancer.  For more information and instruction on self breast exams, ask your healthcare provider or [email or web address]. 4. Eat a healthy diet- Eat a variety of healthy foods such as fruits, vegetables, whole grains, low fat milk, low fat cheeses, yogurt, lean meats, poultry and fish, beans, nuts, tofu, etc.  For more information go to www. Thenutritionsource.org 5. Drink alcohol in moderation- Limit alcohol intake to one drink or less per day. Never drink and drive. 6. Depression- Your emotional health is as important as your physical health.  If you're feeling down or  losing interest in things you normally enjoy please talk to your healthcare provider about being screened for depression. 7. Dental visit- Brush and floss your teeth twice daily; visit your dentist twice a year. 8. Eye doctor- Get an eye exam at least every 2 years. 9. Helmet use- Always wear a helmet when riding a bicycle, motorcycle, rollerblading or skateboarding. 10. Safe sex- If you may be exposed to sexually transmitted infections, use a condom. 11. Seat belts- Seat belts can save your live; always wear one. 12. Smoke/Carbon Monoxide detectors- These detectors need to be installed on the appropriate level of your home. Replace batteries at least once a year. 13. Skin cancer- When out in the sun please cover up and use sunscreen 15 SPF or higher. 14. Violence- If anyone is threatening or hurting you, please tell your healthcare provider.  Oral Contraception Use Oral contraceptives (OCs) are medicines taken to prevent pregnancy. OCs work by preventing the ovaries from releasing eggs. The hormones in OCs also cause the cervical mucus to thicken, preventing the sperm from entering the uterus. The hormones also cause the uterine lining to become thin, not allowing a fertilized egg to attach to the inside of the uterus. OCs are highly effective when taken exactly as prescribed. However, OCs do not prevent sexually transmitted diseases (STDs). Safe sex practices, such as using condoms along with an OC, can help prevent STDs.  Before taking OCs, you may have a physical exam and Pap test. Your caregiver may also order blood tests if necessary. Your caregiver will make sure you are a good candidate for oral contraception. Discuss with your caregiver the possible side effects of the OC you may be prescribed. When starting an OC, it can take 2 to 3 months for the body to adjust to the changes in hormone levels in your body.  HOW TO TAKE ORAL CONTRACEPTIVES Your caregiver may advise you on  how to start taking  the first cycle of OCs. Otherwise, you can:  Start on day 1 of your menstrual period. You will not need any backup contraceptive protection with this start time.  Start on the first Sunday after your menstrual period or the day you get your prescription. In these cases, you will need to use backup contraceptive protection for the first 7-day cycle. After you have started taking OCs:  If you forget to take 1 pill, take it as soon as you remember. Take the next pill at the regular time.  If you miss 2 or more pills, use backup birth control until your next menstrual period starts.  If you use a 28-day pack that contains inactive pills and you miss 1 of the last 7 pills (pills with no hormones), it will not matter. Throw away the rest of the non-hormone pills and start a new pill pack. No matter which day you start the OC, you will always start a new pack on that same day of the week. Have an extra pack of OCs and a backup contraceptive method available in case you miss some pills or lose your OC pack. HOME CARE INSTRUCTIONS   Do not smoke.  Always use a condom to protect against STDs. OCs do not protect against STDs.  Use a calendar to mark your menstrual period days.  Read the information and directions that come with your OC. Talk to your caregiver if you have questions. SEEK MEDICAL CARE IF:   You develop nausea and vomiting.  You have abnormal vaginal discharge or bleeding.  You develop a rash.  You miss your menstrual period.  You are losing your hair.  You need treatment for mood swings or depression.  You get dizzy when taking the OC.  You develop acne from taking the OC.  You become pregnant. SEEK IMMEDIATE MEDICAL CARE IF:   You develop chest pain.  You develop shortness of breath.  You have an uncontrolled or severe headache.  You develop numbness or slurred speech.  You develop visual problems.  You develop pain, redness, and swelling in the  legs. Document Released: 04/02/2011 Document Revised: 07/06/2011 Document Reviewed: 04/02/2011 [facility] Patient Information 2014 [place], [place].

## 2012-11-18 LAB — PAP IG, CT-NG, RFX HPV ASCU
Chlamydia Probe Amp: NEGATIVE
GC Probe Amp: NEGATIVE

## 2012-11-18 LAB — HSV(HERPES SIMPLEX VRS) I + II AB-IGG
HSV 1 Glycoprotein G Ab, IgG: 7.79 IV — ABNORMAL HIGH
HSV 2 Glycoprotein G Ab, IgG: <0.1 IV

## 2013-01-26 ENCOUNTER — Ambulatory Visit (INDEPENDENT_AMBULATORY_CARE_PROVIDER_SITE_OTHER): Payer: Managed Care, Other (non HMO) | Admitting: Family Medicine

## 2013-01-26 VITALS — BP 102/68 | HR 71 | Temp 98.5°F | Resp 16 | Ht 64.0 in | Wt 161.0 lb

## 2013-01-26 DIAGNOSIS — J0100 Acute maxillary sinusitis, unspecified: Secondary | ICD-10-CM

## 2013-01-26 DIAGNOSIS — J309 Allergic rhinitis, unspecified: Secondary | ICD-10-CM

## 2013-01-26 MED ORDER — BENZONATATE 100 MG PO CAPS: 100.0000 mg | ORAL_CAPSULE | Freq: Three times a day (TID) | ORAL | Status: DC | PRN

## 2013-01-26 MED ORDER — AMOXICILLIN 500 MG PO CAPS: 1000.0000 mg | ORAL_CAPSULE | Freq: Two times a day (BID) | ORAL | Status: DC

## 2013-01-26 NOTE — Patient Instructions (Addendum)

## 2013-01-26 NOTE — Progress Notes (Signed)
[address]   [place], [place]  [address]   [phone]  Subjective:    Patient ID: Brenda Molina, female    DOB: August 14, 1985, 27 y.o.   MRN: [id number]  HPI This 27 y.o. female presents for evaluation of head congestion, nasal congestion, dehydration.  R sided sinsu pressure.  Onset five days ago while at the park.  No fever but +sweats.  Drinking a lot of water.  +HA.  +R ear pain.  No sore throat. +nasal congestion; taking Zyrtec.  No rhinorrhea.  +PND at night.  +coughing onset two days ago.  Humidifier.  No v/d.  No medication other than Robitussin.  +sneezing.  Works for [employer].   PCP:  Alwyn Ren  Review of Systems  Constitutional: Positive for diaphoresis. Negative for fever, chills and fatigue.  HENT: Positive for ear pain, congestion, sneezing, voice change, postnasal drip and sinus pressure. Negative for sore throat, rhinorrhea and trouble swallowing.   Respiratory: Positive for cough. Negative for shortness of breath, wheezing and stridor.   Gastrointestinal: Negative for nausea, vomiting and diarrhea.  Skin: Negative for rash.   Past Medical History  Diagnosis Date  . PCOS (polycystic ovarian syndrome)   . Anxiety   . Allergy    History reviewed. No pertinent past surgical history. No Known Allergies Current Outpatient Prescriptions on File Prior to Visit  Medication Sig Dispense Refill  . drospirenone-ethinyl estradiol (YAZ,GIANVI,LORYNA) 3-0.02 MG tablet Take 1 tablet by mouth daily.  1 Package  11   No current facility-administered medications on file prior to visit.   History   Social History  . Marital Status: Single    Spouse Name: N/A    Number of Children: N/A  . Years of Education: N/A   Occupational History  . Not on file.   Social History Main Topics  . Smoking status: Never Smoker   . Smokeless tobacco: Not on file  . Alcohol Use: No  . Drug Use: No  . Sexual Activity: Yes    Partners: Male    Birth Control/ Protection: Condom    Other Topics Concern  . Not on file   Social History Narrative   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 Has no children. 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 Constitutional: She is oriented to person, place, and time. She appears well-developed and well-nourished. No distress.  HENT:  Head: Normocephalic and atraumatic.  Right Ear: External ear normal.  Left Ear: External ear normal.  Nose: Nose normal. Right sinus exhibits no maxillary sinus tenderness and no frontal sinus tenderness. Left sinus exhibits no maxillary sinus tenderness and no frontal sinus tenderness.  Mouth/Throat: Oropharynx is clear and moist.  Eyes: Conjunctivae and EOM are normal. Pupils are equal, round, and reactive to light.  Neck: Normal range of motion. Neck supple. No thyromegaly present.  Cardiovascular: Normal rate, regular rhythm and normal heart sounds.   No murmur heard. Pulmonary/Chest: Effort normal and breath sounds normal. She has no wheezes. She has no rales.  Lymphadenopathy:    She has no cervical adenopathy.  Neurological: She is alert and oriented to person, place, and time.  Skin: Skin is warm and dry. No rash noted. She is not diaphoretic.  Psychiatric: She has a normal mood and affect. Her behavior is normal.      Assessment &  Plan:  Sinusitis, acute maxillary   1.  Acute Maxillary sinusitis:  New. Rx for Amoxicillin provided. Continue Zyrtec and Nasonex.  Rx for [employer] provided for cough.  Meds ordered this encounter  Medications  . amoxicillin (AMOXIL) 500 MG capsule    Sig: Take 2 capsules (1,000 mg total) by mouth 2 (two) times daily.    Dispense:  40 capsule    Refill:  0  . benzonatate (TESSALON) 100 MG capsule    Sig: Take 1-2 capsules (100-200 mg total) by mouth 3 (three) times daily as needed for cough.    Dispense:  40 capsule     Refill:  0

## 2013-04-30 ENCOUNTER — Ambulatory Visit: Payer: Managed Care, Other (non HMO) | Admitting: Physician Assistant

## 2013-04-30 VITALS — BP 120/80 | HR 70 | Temp 98.6°F | Resp 16 | Ht 64.0 in | Wt 160.0 lb

## 2013-04-30 DIAGNOSIS — J309 Allergic rhinitis, unspecified: Secondary | ICD-10-CM

## 2013-04-30 DIAGNOSIS — J019 Acute sinusitis, unspecified: Secondary | ICD-10-CM

## 2013-04-30 MED ORDER — AMOXICILLIN 875 MG PO TABS: 875.0000 mg | ORAL_TABLET | Freq: Two times a day (BID) | ORAL | Status: DC

## 2013-04-30 MED ORDER — MOMETASONE FUROATE 50 MCG/ACT NA SUSP: 2.0000 | Freq: Every day | NASAL | Status: DC

## 2013-04-30 NOTE — Progress Notes (Signed)
   Subjective:    Patient ID: Brenda BarerShavonda D Tatar, female    DOB: 1986-01-01, [age] y.o.   MRN: [id number]  HPI 28 year old female presents for evaluation of 1 week history of nasal congestion, PND, rhinorrhea, cough in the morning, sinus pressure, and thick nasal discharge.  Patient has hx of sinus infections and allergic rhinitis in the past. Has prescription for nasonex that she uses prn - admits this does help although she is now out of this. She has also been taking Zyrtec-D which also helps.  Has had chills and subjective fever.  Afebrile today.  Patient is otherwise doing well with no other concerns today.     Review of Systems  Constitutional: Positive for chills. Negative for fever.  HENT: Positive for congestion, postnasal drip, rhinorrhea, sinus pressure and sore throat. Negative for ear pain.   Respiratory: Positive for cough. Negative for chest tightness, shortness of breath and wheezing.   Cardiovascular: Negative for chest pain.  Gastrointestinal: Negative for nausea, vomiting and abdominal pain.  Neurological: Negative for dizziness and headaches.       Objective:   Physical Exam  Constitutional: She is oriented to person, place, and time. She appears well-developed and well-nourished.  HENT:  Head: Normocephalic and atraumatic.  Right Ear: Hearing, tympanic membrane, external ear and ear canal normal.  Left Ear: Hearing, tympanic membrane, external ear and ear canal normal.  Mouth/Throat: Uvula is midline, oropharynx is clear and moist and mucous membranes are normal. No oropharyngeal exudate.  Eyes: Conjunctivae are normal.  Neck: Normal range of motion. Neck supple.  Cardiovascular: Normal rate, regular rhythm and normal heart sounds.   Pulmonary/Chest: Effort normal and breath sounds normal.  Lymphadenopathy:    She has no cervical adenopathy.  Neurological: She is alert and oriented to person, place, and time.  Psychiatric: She has a normal mood and affect. Her  behavior is normal. Judgment and thought content normal.          Assessment & Plan:  Sinusitis, acute - Plan: amoxicillin (AMOXIL) 875 MG tablet  Allergic rhinitis - Plan: mometasone (NASONEX) 50 MCG/ACT nasal spray  Will go ahead and treat with amoxicillin 875 mg bid x 10 days Refilled Nasonex to use twice daily as needed Continue Zyrtec daily Follow up if symptoms worsen or fail to improve.

## 2013-07-01 ENCOUNTER — Ambulatory Visit: Payer: Managed Care, Other (non HMO) | Admitting: Family Medicine

## 2013-07-01 VITALS — BP 110/70 | HR 73 | Temp 98.0°F | Resp 16 | Ht 63.0 in | Wt 163.0 lb

## 2013-07-01 DIAGNOSIS — J029 Acute pharyngitis, unspecified: Secondary | ICD-10-CM

## 2013-07-01 DIAGNOSIS — J019 Acute sinusitis, unspecified: Secondary | ICD-10-CM

## 2013-07-01 LAB — POCT RAPID STREP A (OFFICE): Rapid Strep A Screen: NEGATIVE

## 2013-07-01 MED ORDER — AMOXICILLIN 875 MG PO TABS: 875.0000 mg | ORAL_TABLET | Freq: Two times a day (BID) | ORAL | Status: DC

## 2013-07-01 NOTE — Patient Instructions (Signed)
Drink plenty of fluids  Get enough rest  Take amoxicillin one twice daily. This can be discontinued and the remainder discarded if the strep test is negative.  Take Tylenol or ibuprofen for the discomfort, and Claritin-D or Allegra-D for the congestion if he gets worse.  Return if needed

## 2013-07-01 NOTE — Progress Notes (Signed)
Subjective: 28 year old lady with a three-day history of a sore throat she hurts most of the left side of the throat and up into the face some and down to the neck. She is a nonsmoker. Has not had a lot of strep. She had some sweats in the night and moderate been febrile. Lesser cycle was about every night. She is on contraception. She has not had much in way of cough.    Objective: Pleasant young lady who doesn't fair well. Her TMs are normal. Throat is erythematous, is on the left side with some swelling left tonsil. Strep test was taken as well as a backup culture if needed. Neck supple but has moderately large node on the left side submandibular angle. Chest is clear. Heart regular without murmurs.  Assessment: Pharyngitis   Plan: Check strep test  Results for orders placed in visit on 07/01/13  POCT RAPID STREP A (OFFICE)      Result Value Ref Range   Rapid Strep A Screen Negative  Negative   Will treat symptomatically but will also place on antibiotic until tests come back from the culture.

## 2013-07-03 LAB — CULTURE, GROUP A STREP: Organism ID, Bacteria: NORMAL

## 2013-08-23 ENCOUNTER — Ambulatory Visit: Payer: Managed Care, Other (non HMO) | Admitting: Family Medicine

## 2013-08-23 VITALS — BP 122/76 | HR 98 | Temp 98.0°F | Resp 17 | Ht 62.5 in | Wt 160.0 lb

## 2013-08-23 DIAGNOSIS — F329 Major depressive disorder, single episode, unspecified: Secondary | ICD-10-CM

## 2013-08-23 DIAGNOSIS — Z0271 Encounter for disability determination: Secondary | ICD-10-CM

## 2013-08-23 DIAGNOSIS — G479 Sleep disorder, unspecified: Secondary | ICD-10-CM

## 2013-08-23 DIAGNOSIS — F411 Generalized anxiety disorder: Secondary | ICD-10-CM

## 2013-08-23 DIAGNOSIS — F3289 Other specified depressive episodes: Secondary | ICD-10-CM

## 2013-08-23 DIAGNOSIS — F32A Depression, unspecified: Secondary | ICD-10-CM

## 2013-08-23 MED ORDER — CLONAZEPAM 0.5 MG PO TABS: 0.5000 mg | ORAL_TABLET | Freq: Two times a day (BID) | ORAL | Status: DC | PRN

## 2013-08-23 MED ORDER — SERTRALINE HCL 50 MG PO TABS: 50.0000 mg | ORAL_TABLET | Freq: Every day | ORAL | Status: DC

## 2013-08-23 NOTE — Patient Instructions (Addendum)
I am beginning you on: Zoloft since sertraline) 50 mg one daily. Take in the morning. It will take being on this for about 2 weeks before you say "Hey, I am feeling better." Clonazepam 0.5 mg one twice daily for anxiety. It will help with sleep also  Continue regular exercise  Avoid regular alcohol use  Avoid transient relationships but tried to build some worthwhile relationships with people he can talk with.   If you ever have suicidal thoughts that you're going on get help immediately. Either come here, go to the emergency room, call someone, or reach out in some fashion to get help or call 911.  Advise regular involvement in a spiritual community. Suggest [facility] which is on [address] [place]  Return here at any time for a followup visit. However I will be out of the country until early June, at which time I would like to have you come in and see me. Call the office late May or early June to find out in a little be here.   Brenda BongoAaron Molina:  [phone]   Brenda Molina:  " Brenda Molina  [phone], [phone] Brenda Molina "

## 2013-08-23 NOTE — Progress Notes (Signed)
Subjective: Patient is here because she had to leave her job this morning with anxiety and depression. She has been struggling a lot for a long time, but lately it's been very bad. She cries much of the time. She isn't sleeping well. She drinks some wine to help him get to sleep. She is working for [employer] from home. She also is a Consulting civil engineerstudent at [employer]. 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 She does not smoke or use drugs. She is on birth pills but not currently sexually active and not pregnant. She went to a church this past Sunday sticking to try and get back into some kind fellowship. She really doesn't have any close friends. Her family is not very approachable since they are always needy and dependent on her. The friends she does have have children and lives a their own which makes it hard for her to connect. She has had suicidal thoughts at times, but is not dwelling on doing something to herself.  Objective: Physical exam not done. Talk with the patient for a long time. She is very tearful. There  Assessment: Depression Anxiety Sleep disturbance  Plan: Zoloft Sertraline See instructions written out.  Return at any time if worse or if any suicidal ideation. She is to see me in early June when I return from my trip.

## 2014-01-17 ENCOUNTER — Ambulatory Visit (INDEPENDENT_AMBULATORY_CARE_PROVIDER_SITE_OTHER): Payer: Managed Care, Other (non HMO) | Admitting: Family Medicine

## 2014-01-17 VITALS — BP 118/68 | HR 88 | Temp 97.6°F | Resp 17 | Ht 63.0 in | Wt 169.0 lb

## 2014-01-17 DIAGNOSIS — Z23 Encounter for immunization: Secondary | ICD-10-CM

## 2014-01-17 DIAGNOSIS — F329 Major depressive disorder, single episode, unspecified: Secondary | ICD-10-CM

## 2014-01-17 DIAGNOSIS — Z124 Encounter for screening for malignant neoplasm of cervix: Secondary | ICD-10-CM

## 2014-01-17 DIAGNOSIS — F3289 Other specified depressive episodes: Secondary | ICD-10-CM

## 2014-01-17 DIAGNOSIS — Z113 Encounter for screening for infections with a predominantly sexual mode of transmission: Secondary | ICD-10-CM

## 2014-01-17 DIAGNOSIS — Z13220 Encounter for screening for lipoid disorders: Secondary | ICD-10-CM

## 2014-01-17 DIAGNOSIS — Z3009 Encounter for other general counseling and advice on contraception: Secondary | ICD-10-CM

## 2014-01-17 DIAGNOSIS — Z0000 Encounter for general adult medical examination without abnormal findings: Secondary | ICD-10-CM

## 2014-01-17 DIAGNOSIS — F32A Depression, unspecified: Secondary | ICD-10-CM

## 2014-01-17 LAB — LIPID PANEL
Cholesterol: 214 mg/dL — ABNORMAL HIGH (ref 0–200)
HDL: 77 mg/dL (ref >39)
LDL Cholesterol: 126 mg/dL — ABNORMAL HIGH (ref 0–99)
Total CHOL/HDL Ratio: 2.8 Ratio
Triglycerides: 55 mg/dL (ref <150)
VLDL: 11 mg/dL (ref 0–40)

## 2014-01-17 MED ORDER — DROSPIRENONE-ETHINYL ESTRADIOL 3-0.02 MG PO TABS: 1.0000 | ORAL_TABLET | Freq: Every day | ORAL | Status: DC

## 2014-01-17 MED ORDER — CLONAZEPAM 1 MG PO TABS: 1.0000 mg | ORAL_TABLET | Freq: Two times a day (BID) | ORAL | Status: DC

## 2014-01-17 MED ORDER — SERTRALINE HCL 50 MG PO TABS: 50.0000 mg | ORAL_TABLET | Freq: Every day | ORAL | Status: DC

## 2014-01-17 NOTE — Patient Instructions (Addendum)
Good to see you today- I will be in touch with your labs.  Consider having the hepatitis B vaccine series at your convenience.    Influenza Virus Vaccine injection (Fluarix) What is this medicine? INFLUENZA VIRUS VACCINE (in floo EN zuh VAHY ruhs vak SEEN) helps to reduce the risk of getting influenza also known as the flu. This medicine may be used for other purposes; ask your health care provider or pharmacist if you have questions. COMMON BRAND NAME(S): Fluarix, Fluzone What should I tell my health care provider before I take this medicine? They need to know if you have any of these conditions: -bleeding disorder like hemophilia -fever or infection -Guillain-Barre syndrome or other neurological problems -immune system problems -infection with the human immunodeficiency virus (HIV) or AIDS -low blood platelet counts -multiple sclerosis -an unusual or allergic reaction to influenza virus vaccine, eggs, chicken proteins, latex, gentamicin, other medicines, foods, dyes or preservatives -pregnant or trying to get pregnant -breast-feeding How should I use this medicine? This vaccine is for injection into a muscle. It is given by a health care professional. A copy of Vaccine Information Statements will be given before each vaccination. Read this sheet carefully each time. The sheet may change frequently. Talk to your pediatrician regarding the use of this medicine in children. Special care may be needed. Overdosage: If you think you have taken too much of this medicine contact a poison control center or emergency room at once. NOTE: This medicine is only for you. Do not share this medicine with others. What if I miss a dose? This does not apply. What may interact with this medicine? -chemotherapy or radiation therapy -medicines that lower your immune system like etanercept, anakinra, infliximab, and adalimumab -medicines that treat or prevent blood clots like warfarin -phenytoin -steroid  medicines like prednisone or cortisone -theophylline -vaccines This list may not describe all possible interactions. Give your health care provider a list of all the medicines, herbs, non-prescription drugs, or dietary supplements you use. Also tell them if you smoke, drink alcohol, or use illegal drugs. Some items may interact with your medicine. What should I watch for while using this medicine? Report any side effects that do not go away within 3 days to your doctor or health care professional. Call your health care provider if any unusual symptoms occur within 6 weeks of receiving this vaccine. You may still catch the flu, but the illness is not usually as bad. You cannot get the flu from the vaccine. The vaccine will not protect against colds or other illnesses that may cause fever. The vaccine is needed every year. What side effects may I notice from receiving this medicine? Side effects that you should report to your doctor or health care professional as soon as possible: -allergic reactions like skin rash, itching or hives, swelling of the face, lips, or tongue Side effects that usually do not require medical attention (report to your doctor or health care professional if they continue or are bothersome): -fever -headache -muscle aches and pains -pain, tenderness, redness, or swelling at site where injected -weak or tired This list may not describe all possible side effects. Call your doctor for medical advice about side effects. You may report side effects to FDA at 1-800-FDA-1088. Where should I keep my medicine? This vaccine is only given in a clinic, pharmacy, doctor's office, or other health care setting and will not be stored at home. NOTE: This sheet is a summary. It may not cover all possible information.  If you have questions about this medicine, talk to your doctor, pharmacist, or health care provider.  2015, Elsevier/Gold Standard. (2007-11-09 09:30:40) Tdap Vaccine (Tetanus,  Diphtheria, Pertussis): What You Need to Know 1. Why get vaccinated? Tetanus, diphtheria and pertussis can be very serious diseases, even for adolescents and adults. Tdap vaccine can protect [place] from these diseases. TETANUS (Lockjaw) causes painful muscle tightening and stiffness, usually all over the body.  It can lead to tightening of muscles in the head and neck so you can't open your mouth, swallow, or sometimes even breathe. Tetanus kills about 1 out of 5 people who are infected. DIPHTHERIA can cause a thick coating to form in the back of the throat.  It can lead to breathing problems, paralysis, heart failure, and death. PERTUSSIS (Whooping Cough) causes severe coughing spells, which can cause difficulty breathing, vomiting and disturbed sleep.  It can also lead to weight loss, incontinence, and rib fractures. Up to 2 in 100 adolescents and 5 in 100 adults with pertussis are hospitalized or have complications, which could include pneumonia or death. These diseases are caused by bacteria. Diphtheria and pertussis are spread from person to person through coughing or sneezing. Tetanus enters the body through cuts, scratches, or wounds. Before vaccines, the [place] saw as many as 200,000 cases a year of diphtheria and pertussis, and hundreds of cases of tetanus. Since vaccination began, tetanus and diphtheria have dropped by about 99% and pertussis by about 80%. 2. Tdap vaccine Tdap vaccine can protect adolescents and adults from tetanus, diphtheria, and pertussis. One dose of Tdap is routinely given at age 6 or 6. People who did not get Tdap at that age should get it as soon as possible. Tdap is especially important for health care professionals and anyone having close contact with a baby younger than 12 months. Pregnant women should get a dose of Tdap during every pregnancy, to protect the newborn from pertussis. Infants are most at risk for severe, life-threatening complications from  pertussis. A similar vaccine, called Td, protects from tetanus and diphtheria, but not pertussis. A Td booster should be given every 10 years. Tdap may be given as one of these boosters if you have not already gotten a dose. Tdap may also be given after a severe cut or burn to prevent tetanus infection. Your doctor can give you more information. Tdap may safely be given at the same time as other vaccines. 3. Some people should not get this vaccine  If you ever had a life-threatening allergic reaction after a dose of any tetanus, diphtheria, or pertussis containing vaccine, OR if you have a severe allergy to any part of this vaccine, you should not get Tdap. Tell your doctor if you have any severe allergies.  If you had a coma, or long or multiple seizures within 7 days after a childhood dose of DTP or DTaP, you should not get Tdap, unless a cause other than the vaccine was found. You can still get Td.  Talk to your doctor if you:  have epilepsy or another nervous system problem,  had severe pain or swelling after any vaccine containing diphtheria, tetanus or pertussis,  ever had Guillain-Barr Syndrome (GBS),  aren't feeling well on the day the shot is scheduled. 4. Risks of a vaccine reaction With any medicine, including vaccines, there is a chance of side effects. These are usually mild and go away on their own, but serious reactions are also possible. Brief fainting spells can follow a vaccination,  leading to injuries from falling. Sitting or lying down for about 15 minutes can help prevent these. Tell your doctor if you feel dizzy or light-headed, or have vision changes or ringing in the ears. Mild problems following Tdap (Did not interfere with activities)  Pain where the shot was given (about 3 in 4 adolescents or 2 in 3 adults)  Redness or swelling where the shot was given (about 1 person in 5)  Mild fever of at least 100.75F (up to about 1 in 25 adolescents or 1 in 100  adults)  Headache (about 3 or 4 people in 10)  Tiredness (about 1 person in 3 or 4)  Nausea, vomiting, diarrhea, stomach ache (up to 1 in 4 adolescents or 1 in 10 adults)  Chills, body aches, sore joints, rash, swollen glands (uncommon) Moderate problems following Tdap (Interfered with activities, but did not require medical attention)  Pain where the shot was given (about 1 in 5 adolescents or 1 in 100 adults)  Redness or swelling where the shot was given (up to about 1 in 16 adolescents or 1 in 25 adults)  Fever over 102F (about 1 in 100 adolescents or 1 in 250 adults)  Headache (about 3 in 20 adolescents or 1 in 10 adults)  Nausea, vomiting, diarrhea, stomach ache (up to 1 or 3 people in 100)  Swelling of the entire arm where the shot was given (up to about 3 in 100). Severe problems following Tdap (Unable to perform usual activities; required medical attention)  Swelling, severe pain, bleeding and redness in the arm where the shot was given (rare). A severe allergic reaction could occur after any vaccine (estimated less than 1 in a million doses). 5. What if there is a serious reaction? What should I look for?  Look for anything that concerns you, such as signs of a severe allergic reaction, very high fever, or behavior changes. Signs of a severe allergic reaction can include hives, swelling of the face and throat, difficulty breathing, a fast heartbeat, dizziness, and weakness. These would start a few minutes to a few hours after the vaccination. What should I do?  If you think it is a severe allergic reaction or other emergency that can't wait, call 9-1-1 or get the person to the nearest hospital. Otherwise, call your doctor.  Afterward, the reaction should be reported to the "Vaccine Adverse Event Reporting System" (VAERS). Your doctor might file this report, or you can do it yourself through the VAERS web site at www.vaers.LAgents.no, or by calling 1-308-442-7505. VAERS is  only for reporting reactions. They do not give medical advice.  6. The National Vaccine Injury Compensation Program The [employer] Vaccine Injury Compensation Program (VICP) is a federal program that was created to compensate people who may have been injured by certain vaccines. Persons who believe they may have been injured by a vaccine can learn about the program and about filing a claim by calling 1-606-205-4998 or visiting the VICP website at SpiritualWord.at. 7. How can I learn more?  Ask your doctor.  Call your local or state health department.  Contact the Centers for Disease Control and Prevention (CDC):  Call 8128422050 or visit CDC's website at PicCapture.uy. CDC Tdap Vaccine VIS (09/03/11) Document Released: 10/13/2011 Document Revised: 08/28/2013 Document Reviewed: 07/26/2013 ExitCare Patient Information 2015 [place], [place]. This information is not intended to replace advice given to you by your health care provider. Make sure you discuss any questions you have with your health care provider.

## 2014-01-17 NOTE — Progress Notes (Signed)
[facility] [address], [place] [address] [phone]  Date:  01/17/2014   Name:  Brenda Molina   DOB:  [birth year]   MRN:  [id number]  PCP:  Janace Hoard, MD    Chief Complaint: Annual Exam and Medication Refill   History of Present Illness:  Brenda Molina is a 28 y.o. very pleasant female patient who presents with the following:  Here today for a PE/ pap and medication refills.  Normal pap a year ago.  Never had an abnormal.  Wound like to have a pap today, also STI testing.   She last ate a few hours ago She is doing well with her zoloft and uses klonopin on occasion.  Also needs a RF of her birth control She is not sure of the date of her last tetanus shot but thinks it has been a while.  Would also like to get a flu shot today  There are no active problems to display for this patient.   Past Medical History  Diagnosis Date  . PCOS (polycystic ovarian syndrome)   . Anxiety   . Allergy     No past surgical history on file.  History  Substance Use Topics  . Smoking status: Never Smoker   . Smokeless tobacco: Not on file  . Alcohol Use: No    Family History  Problem Relation Age of Onset  . Asthma Father   . Hypertension Father     No Known Allergies  Medication list has been reviewed and updated.  Current Outpatient Prescriptions on File Prior to Visit  Medication Sig Dispense Refill  . drospirenone-ethinyl estradiol (YAZ,GIANVI,LORYNA) 3-0.02 MG tablet Take 1 tablet by mouth daily.  1 Package  11  . mometasone (NASONEX) 50 MCG/ACT nasal spray Place 2 sprays into the nose daily.  17 g  5  . sertraline (ZOLOFT) 50 MG tablet Take 1 tablet (50 mg total) by mouth daily.  30 tablet  3   No current facility-administered medications on file prior to visit.    Review of Systems:  As per HPI- otherwise negative.   Physical Examination: Filed Vitals:   01/17/14 1350  BP: 118/68  Pulse: 88  Temp: 97.6 F (36.4 C)  Resp:  17   Filed Vitals:   01/17/14 1350  Height:  (1.6 m)  Weight: 169 lb (76.658 kg)   Body mass index is 29.94 kg/(m^2). Ideal Body Weight: Weight in (lb) to have BMI = 25: 140.8  GEN: WDWN, NAD, Non-toxic, A & O x 3, overweight, looks well HEENT: Atraumatic, Normocephalic. Neck supple. No masses, No LAD.  Bilateral TM wnl, oropharynx normal.  PEERL,EOMI.   Ears and Nose: No external deformity. CV: RRR, No M/G/R. No JVD. No thrill. No extra heart sounds. PULM: CTA B, no wheezes, crackles, rhonchi. No retractions. No resp. distress. No accessory muscle use. ABD: S, NT, ND, +BS. No rebound. No HSM. EXTR: No c/c/e NEURO Normal gait.  PSYCH: Normally interactive. Conversant. Not depressed or anxious appearing.  Calm demeanor.  Breast: normal exam, no masses/ dimpling/ discharge Pelvic: normal, no vaginal lesions or discharge. Uterus normal, no CMT, no adnexal tendereness or masses   Assessment and Plan: Screening for cervical cancer - Plan: Pap IG, CT/NG w/ reflex HPV when ASC-U  Screening for STD (sexually transmitted disease) - Plan: RPR, HIV antibody, Hepatitis B surface antigen, Hepatitis C antibody  Immunization due - Plan: Tdap vaccine greater than or equal to 7yo IM  Physical exam  Screening for hyperlipidemia - Plan: Lipid panel  Encounter for other general counseling or advice on contraception - Plan: drospirenone-ethinyl estradiol (YAZ,GIANVI,LORYNA) 3-0.02 MG tablet  Annual physical exam - Plan: drospirenone-ethinyl estradiol (YAZ,GIANVI,LORYNA) 3-0.02 MG tablet  Depression - Plan: sertraline (ZOLOFT) 50 MG tablet, clonazePAM (KLONOPIN) 1 MG tablet  Pap and other STI and hyperlipidemia screening as above.  Refilled her OCP and other medications Follow-up with labs.   tdap and flu shots today  Signed Abbe Amsterdam, MD

## 2014-01-18 ENCOUNTER — Encounter: Payer: Self-pay | Admitting: Family Medicine

## 2014-01-18 LAB — HEPATITIS C ANTIBODY: HCV Ab: NEGATIVE

## 2014-01-18 LAB — PAP IG, CT-NG, RFX HPV ASCU
Chlamydia Probe Amp: NEGATIVE
GC Probe Amp: NEGATIVE

## 2014-01-18 LAB — HEPATITIS B SURFACE ANTIGEN: Hepatitis B Surface Ag: NEGATIVE

## 2014-01-18 LAB — RPR

## 2014-01-18 LAB — HIV ANTIBODY (ROUTINE TESTING W REFLEX): HIV 1&2 Ab, 4th Generation: NONREACTIVE

## 2014-02-19 ENCOUNTER — Ambulatory Visit (INDEPENDENT_AMBULATORY_CARE_PROVIDER_SITE_OTHER): Payer: Managed Care, Other (non HMO) | Admitting: Internal Medicine

## 2014-02-19 VITALS — BP 110/70 | HR 78 | Temp 98.3°F | Resp 16 | Ht 63.0 in | Wt 170.0 lb

## 2014-02-19 DIAGNOSIS — J4521 Mild intermittent asthma with (acute) exacerbation: Secondary | ICD-10-CM

## 2014-02-19 DIAGNOSIS — J302 Other seasonal allergic rhinitis: Secondary | ICD-10-CM

## 2014-02-19 DIAGNOSIS — J0100 Acute maxillary sinusitis, unspecified: Secondary | ICD-10-CM

## 2014-02-19 MED ORDER — HYDROCODONE-HOMATROPINE 5-1.5 MG/5ML PO SYRP: 5.0000 mL | ORAL_SOLUTION | Freq: Four times a day (QID) | ORAL | Status: DC | PRN

## 2014-02-19 MED ORDER — ALBUTEROL SULFATE HFA 108 (90 BASE) MCG/ACT IN AERS: 2.0000 | INHALATION_SPRAY | Freq: Four times a day (QID) | RESPIRATORY_TRACT | Status: DC | PRN

## 2014-02-19 MED ORDER — AMOXICILLIN 875 MG PO TABS: 875.0000 mg | ORAL_TABLET | Freq: Two times a day (BID) | ORAL | Status: DC

## 2014-02-19 NOTE — Progress Notes (Signed)
Subjective:  This chart was scribed for San[employer]-SCIobert P. Merla Richesoolittle, MD by Marica OtterNusrat Rahman, ED Scribe. This patient was seen in room 5 and the patient's care was started at 6:20 PM.     Patient ID: Brenda Molina, female    DOB: 02-04-1986, 28 y.o.   MRN: [id number]  Chief Complaint  Patient presents with  . Cough    x 1 week    Cough This is a new problem. The current episode started 1 to 4 weeks ago. The problem has been unchanged. The problem occurs every few minutes. Associated symptoms include shortness of breath (due to cough). Pertinent negatives include no chills or fever.   HPI Comments: PCP: HOPPER,DAVID, MD Brenda BarerShavonda D Nemitz is a 28 y.o. female, with medical Hx noted below and significant for a Hx of asthma, who [facility] complaining of intermittent productive cough with associated sleep disturbances and SOB onset one week ago. Pt reports taking Zyrtec, Flonase, antibiotics, and Mucinex with mild relief-- pt notes greatest relief from Flonase. Pt also reports using a humidifier at night with relief.   Past Medical History  Diagnosis Date  . PCOS (polycystic ovarian syndrome)   . Anxiety   . Allergy    Prior to Admission medications   Medication Sig Start Date End Date Taking? Authorizing Provider  clonazePAM (KLONOPIN) 1 MG tablet Take 1 tablet (1 mg total) by mouth 2 (two) times daily. 01/17/14  Yes Gwenlyn FoundJessica C Copland, MD  drospirenone-ethinyl estradiol (YAZ,GIANVI,LORYNA) 3-0.02 MG tablet Take 1 tablet by mouth daily. 01/17/14  Yes Jessica C Copland, MD  mometasone (NASONEX) 50 MCG/ACT nasal spray Place 2 sprays into the nose daily. 04/30/13  Yes Heather M Marte, PA-C  sertraline (ZOLOFT) 50 MG tablet Take 1 tablet (50 mg total) by mouth daily. 01/17/14  Yes Gwenlyn FoundJessica C Copland, MD   No Known Allergies     Review of Systems  Constitutional: Negative for fever and chills.  Respiratory: Positive for cough and shortness of breath (due to cough).     Psychiatric/Behavioral: Positive for sleep disturbance (due to cough). Negative for confusion.       Objective:   Physical Exam  Nursing note and vitals reviewed. Constitutional: She is oriented to person, place, and time. She appears well-developed and well-nourished. No distress.  HENT:  Head: Normocephalic and atraumatic.  Mouth/Throat: Oropharynx is clear and moist. No oropharyngeal exudate.  Purulent nasal discharge  Eyes: Conjunctivae and EOM are normal.  Cardiovascular: Normal rate and regular rhythm.   Pulmonary/Chest: Effort normal. No respiratory distress. She has wheezes (mild wheezes on forced expiration ).  Musculoskeletal: Normal range of motion.  Neurological: She is alert and oriented to person, place, and time.  Skin: Skin is warm and dry.  Psychiatric: She has a normal mood and affect. Her behavior is normal.          Assessment & Plan:  DIAGNOSTIC STUDIES: Oxygen Saturation is 100% on RA, nl by my interpretation.    COORDINATION OF CARE: 6:27 PM-Discussed treatment plan which includes meds (noted below) with pt at bedside and pt agreed to plan.  Acute maxillary sinusitis, recurrence not specified  Other seasonal allergic rhinitis  Reactive airway disease, mild intermittent, with acute exacerbation   Meds ordered this encounter  Medications  . amoxicillin (AMOXIL) 875 MG tablet    Sig: Take 1 tablet (875 mg total) by mouth 2 (two) times daily.    Dispense:  20 tablet  Refill:  0  . HYDROcodone-homatropine (HYCODAN) 5-1.5 MG/5ML syrup    Sig: Take 5 mLs by mouth every 6 (six) hours as needed.    Dispense:  120 mL    Refill:  0  . albuterol (PROVENTIL HFA;VENTOLIN HFA) 108 (90 BASE) MCG/ACT inhaler    Sig: Inhale 2 puffs into the lungs every 6 (six) hours as needed for wheezing or shortness of breath. And for cough for next 7-10 days    Dispense:  1 Inhaler    Refill:  0    I have completed the patient encounter in its entirety as documented  by the scribe, with editing by me where necessary. Hayato Guaman P. Merla Richesoolittle, M.D.

## 2014-04-22 ENCOUNTER — Other Ambulatory Visit: Payer: Self-pay | Admitting: Family Medicine

## 2014-11-01 ENCOUNTER — Ambulatory Visit (INDEPENDENT_AMBULATORY_CARE_PROVIDER_SITE_OTHER): Payer: Managed Care, Other (non HMO) | Admitting: Physician Assistant

## 2014-11-01 VITALS — BP 110/68 | HR 73 | Temp 98.2°F | Resp 16 | Ht 63.0 in | Wt 159.2 lb

## 2014-11-01 DIAGNOSIS — J309 Allergic rhinitis, unspecified: Secondary | ICD-10-CM

## 2014-11-01 DIAGNOSIS — N912 Amenorrhea, unspecified: Secondary | ICD-10-CM | POA: Diagnosis not present

## 2014-11-01 DIAGNOSIS — R5383 Other fatigue: Secondary | ICD-10-CM | POA: Diagnosis not present

## 2014-11-01 DIAGNOSIS — E282 Polycystic ovarian syndrome: Secondary | ICD-10-CM | POA: Diagnosis not present

## 2014-11-01 DIAGNOSIS — Z3490 Encounter for supervision of normal pregnancy, unspecified, unspecified trimester: Secondary | ICD-10-CM

## 2014-11-01 DIAGNOSIS — Z0000 Encounter for general adult medical examination without abnormal findings: Secondary | ICD-10-CM

## 2014-11-01 LAB — COMPREHENSIVE METABOLIC PANEL
ALT: 15 U/L (ref 0–35)
AST: 20 U/L (ref 0–37)
Albumin: 4.3 g/dL (ref 3.5–5.2)
Alkaline Phosphatase: 72 U/L (ref 39–117)
BUN: 12 mg/dL (ref 6–23)
CO2: 25 mEq/L (ref 19–32)
Calcium: 9.5 mg/dL (ref 8.4–10.5)
Chloride: 99 mEq/L (ref 96–112)
Creat: 0.83 mg/dL (ref 0.50–1.10)
Glucose, Bld: 91 mg/dL (ref 70–99)
Potassium: 4 mEq/L (ref 3.5–5.3)
Sodium: 133 mEq/L — ABNORMAL LOW (ref 135–145)
Total Bilirubin: 0.6 mg/dL (ref 0.2–1.2)
Total Protein: 6.9 g/dL (ref 6.0–8.3)

## 2014-11-01 LAB — CBC
HCT: 41.4 % (ref 36.0–46.0)
Hemoglobin: 13.6 g/dL (ref 12.0–15.0)
MCH: 25.1 pg — ABNORMAL LOW (ref 26.0–34.0)
MCHC: 32.9 g/dL (ref 30.0–36.0)
MCV: 76.4 fL — ABNORMAL LOW (ref 78.0–100.0)
MPV: 9.9 fL (ref 8.6–12.4)
Platelets: 216 10*3/uL (ref 150–400)
RBC: 5.42 MIL/uL — ABNORMAL HIGH (ref 3.87–5.11)
RDW: 14.8 % (ref 11.5–15.5)
WBC: 10.9 10*3/uL — ABNORMAL HIGH (ref 4.0–10.5)

## 2014-11-01 LAB — TSH: TSH: 1.836 u[IU]/mL (ref 0.350–4.500)

## 2014-11-01 LAB — POCT URINE PREGNANCY: Preg Test, Ur: POSITIVE — AB

## 2014-11-01 LAB — HEMOGLOBIN A1C
Hgb A1c MFr Bld: 5.5 % (ref <5.7)
Mean Plasma Glucose: 111 mg/dL (ref <117)

## 2014-11-01 NOTE — Patient Instructions (Addendum)
Estimated gestation 7 weeks 1 day. Estimated delivery June 18, 2015.  Start taking a prenatal vitamin daily. No deli meat, raw meat/fish, soft cheeses. No medications other than occ ibuprofen. Talk with your ob/gyn about safety of medications. No alcohol. If you develop abdominal pain, vaginal bleeding, vaginal discharge, painful urination--> be seen asap.  First Trimester of Pregnancy The first trimester of pregnancy is from week 1 until the end of week 12 (months 1 through 3). A week after a sperm fertilizes an egg, the egg will implant on the wall of the uterus. This embryo will begin to develop into a baby. Genes from you and your partner are forming the baby. The female genes determine whether the baby is a boy or a girl. At 6-8 weeks, the eyes and face are formed, and the heartbeat can be seen on ultrasound. At the end of 12 weeks, all the baby's organs are formed.  Now that you are pregnant, you will want to do everything you can to have a healthy baby. Two of the most important things are to get good prenatal care and to follow your health care provider's instructions. Prenatal care is all the medical care you receive before the baby's birth. This care will help prevent, find, and treat any problems during the pregnancy and childbirth. BODY CHANGES Your body goes through many changes during pregnancy. The changes vary from woman to woman.   You may gain or lose a couple of pounds at first.  You may feel sick to your stomach (nauseous) and throw up (vomit). If the vomiting is uncontrollable, call your health care provider.  You may tire easily.  You may develop headaches that can be relieved by medicines approved by your health care provider.  You may urinate more often. Painful urination may mean you have a bladder infection.  You may develop heartburn as a result of your pregnancy.  You may develop constipation because certain hormones are causing the muscles that push waste  through your intestines to slow down.  You may develop hemorrhoids or swollen, bulging veins (varicose veins).  Your breasts may begin to grow larger and become tender. Your nipples may stick out more, and the tissue that surrounds them (areola) may become darker.  Your gums may bleed and may be sensitive to brushing and flossing.  Dark spots or blotches (chloasma, mask of pregnancy) may develop on your face. This will likely fade after the baby is born.  Your menstrual periods will stop.  You may have a loss of appetite.  You may develop cravings for certain kinds of food.  You may have changes in your emotions from day to day, such as being excited to be pregnant or being concerned that something may go wrong with the pregnancy and baby.  You may have more vivid and strange dreams.  You may have changes in your hair. These can include thickening of your hair, rapid growth, and changes in texture. Some women also have hair loss during or after pregnancy, or hair that feels dry or thin. Your hair will most likely return to normal after your baby is born. WHAT TO EXPECT AT YOUR PRENATAL VISITS During a routine prenatal visit:  You will be weighed to make sure you and the baby are growing normally.  Your blood pressure will be taken.  Your abdomen will be measured to track your baby's growth.  The fetal heartbeat will be listened to starting around week 10 or 12 of your pregnancy.  Test results from any previous visits will be discussed. Your health care provider may ask you:  How you are feeling.  If you are feeling the baby move.  If you have had any abnormal symptoms, such as leaking fluid, bleeding, severe headaches, or abdominal cramping.  If you have any questions. Other tests that may be performed during your first trimester include:  Blood tests to find your blood type and to check for the presence of any previous infections. They will also be used to check for low  iron levels (anemia) and Rh antibodies. Later in the pregnancy, blood tests for diabetes will be done along with other tests if problems develop.  Urine tests to check for infections, diabetes, or protein in the urine.  An ultrasound to confirm the proper growth and development of the baby.  An amniocentesis to check for possible genetic problems.  Fetal screens for spina bifida and Down syndrome.  You may need other tests to make sure you and the baby are doing well. HOME CARE INSTRUCTIONS  Medicines  Follow your health care provider's instructions regarding medicine use. Specific medicines may be either safe or unsafe to take during pregnancy.  Take your prenatal vitamins as directed.  If you develop constipation, try taking a stool softener if your health care provider approves. Diet  Eat regular, well-balanced meals. Choose a variety of foods, such as meat or vegetable-based protein, fish, milk and low-fat dairy products, vegetables, fruits, and whole grain breads and cereals. Your health care provider will help you determine the amount of weight gain that is right for you.  Avoid raw meat and uncooked cheese. These carry germs that can cause birth defects in the baby.  Eating four or five small meals rather than three large meals a day may help relieve nausea and vomiting. If you start to feel nauseous, eating a few soda crackers can be helpful. Drinking liquids between meals instead of during meals also seems to help nausea and vomiting.  If you develop constipation, eat more high-fiber foods, such as fresh vegetables or fruit and whole grains. Drink enough fluids to keep your urine clear or pale yellow. Activity and Exercise  Exercise only as directed by your health care provider. Exercising will help you:  Control your weight.  Stay in shape.  Be prepared for labor and delivery.  Experiencing pain or cramping in the lower abdomen or low back is a good sign that you  should stop exercising. Check with your health care provider before continuing normal exercises.  Try to avoid standing for long periods of time. Move your legs often if you must stand in one place for a long time.  Avoid heavy lifting.  Wear low-heeled shoes, and practice good posture.  You may continue to have sex unless your health care provider directs you otherwise. Relief of Pain or Discomfort  Wear a good support bra for breast tenderness.   Take warm sitz baths to soothe any pain or discomfort caused by hemorrhoids. Use hemorrhoid cream if your health care provider approves.   Rest with your legs elevated if you have leg cramps or low back pain.  If you develop varicose veins in your legs, wear support hose. Elevate your feet for 15 minutes, 3-4 times a day. Limit salt in your diet. Prenatal Care  Schedule your prenatal visits by the twelfth week of pregnancy. They are usually scheduled monthly at first, then more often in the last 2 months before delivery.  Write  down your questions. Take them to your prenatal visits.  Keep all your prenatal visits as directed by your health care provider. Safety  Wear your seat belt at all times when driving.  Make a list of emergency phone numbers, including numbers for family, friends, the hospital, and police and fire departments. General Tips  Ask your health care provider for a referral to a local prenatal education class. Begin classes no later than at the beginning of month 6 of your pregnancy.  Ask for help if you have counseling or nutritional needs during pregnancy. Your health care provider can offer advice or refer you to specialists for help with various needs.  Do not use hot tubs, steam rooms, or saunas.  Do not douche or use tampons or scented sanitary pads.  Do not cross your legs for long periods of time.  Avoid cat litter boxes and soil used by cats. These carry germs that can cause birth defects in the baby  and possibly loss of the fetus by miscarriage or stillbirth.  Avoid all smoking, herbs, alcohol, and medicines not prescribed by your health care provider. Chemicals in these affect the formation and growth of the baby.  Schedule a dentist appointment. At home, brush your teeth with a soft toothbrush and be gentle when you floss. SEEK MEDICAL CARE IF:   You have dizziness.  You have mild pelvic cramps, pelvic pressure, or nagging pain in the abdominal area.  You have persistent nausea, vomiting, or diarrhea.  You have a bad smelling vaginal discharge.  You have pain with urination.  You notice increased swelling in your face, hands, legs, or ankles. SEEK IMMEDIATE MEDICAL CARE IF:   You have a fever.  You are leaking fluid from your vagina.  You have spotting or bleeding from your vagina.  You have severe abdominal cramping or pain.  You have rapid weight gain or loss.  You vomit blood or material that looks like coffee grounds.  You are exposed to [place] measles and have never had them.  You are exposed to fifth disease or chickenpox.  You develop a severe headache.  You have shortness of breath.  You have any kind of trauma, such as from a fall or a car accident. Document Released: 04/07/2001 Document Revised: 08/28/2013 Document Reviewed: 02/21/2013 [facility] Patient Information 2015 [place], [place]. This information is not intended to replace advice given to you by your health care provider. Make sure you discuss any questions you have with your health care provider.

## 2014-11-01 NOTE — Progress Notes (Signed)
[facility] [address], [place] [address] [phone]  Date:  11/01/2014   Name:  Brenda Molina   DOB:  1985/11/08   MRN:  [id number]  PCP:  Janace Hoard, MD    Chief Complaint: Annual Exam   History of Present Illness:  This is a 29 y.o. female who is presenting for CPE.  LMP: 09/12/14. Sometimes has irregular periods - has dx of PCOS. Breasts are sore currently, she thinks her period is about to start. She ran out of her OCP 3 months ago. Her and her current partner have not used any contraception since being off the OCP. Pt has been more fatigued the past couple months. Has been intermittently dizzy, esp when working out. Denies n/v, abdominal pain, vaginal bleeding, vaginal discharge. Pt lost 15 pounds in the past 6 months but gained 5 back in the past couple months. This is bothering her because she has worked very hard to lose the weight. She is noting some of her facial hair is coming back. She has never been pregnant before.  Pt has allergic rhinitis that is worse in the spring and summer. 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  Last pap: 12/2013. Never had any abnormals Immunizations: 12/2013, flu 2015 Dentist: every 6 months Eye: no vision changes Diet/Exercise: boot camp a few times a week at a park. Fam hx: No fam hx DM, HTN, cancers, heart disease Tobacco/alcohol/substance use: no/occ glass of wine/no Does not take any medications regularly.  Review of Systems:  Review of Systems  Constitutional: Positive for fatigue and unexpected weight change. Negative for fever, chills, diaphoresis, activity change and appetite change.  HENT: Positive for congestion. Negative for dental problem, drooling, ear discharge, ear pain, facial swelling, hearing loss, mouth sores, nosebleeds, postnasal drip, rhinorrhea, sinus pressure, sneezing, sore throat, tinnitus, trouble swallowing and voice change.   Eyes:  Negative.   Respiratory: Negative.   Cardiovascular: Negative.   Gastrointestinal: Negative.   Genitourinary: Positive for menstrual problem. Negative for dysuria, urgency, frequency, hematuria, flank pain, decreased urine volume, vaginal bleeding, vaginal discharge, enuresis, difficulty urinating, genital sores, vaginal pain, pelvic pain and dyspareunia.       Breast soreness  Musculoskeletal: Negative.   Skin: Negative.   Allergic/Immunologic: Positive for environmental allergies. Negative for food allergies and immunocompromised state.  Neurological: Positive for light-headedness. Negative for dizziness, tremors, seizures, syncope, facial asymmetry, speech difficulty, weakness, numbness and headaches.  Hematological: Negative.   Psychiatric/Behavioral: Negative.     There are no active problems to display for this patient.  No Known Allergies  History reviewed. No pertinent past surgical history.  History  Substance Use Topics  . Smoking status: Never Smoker   . Smokeless tobacco: Not on file  . Alcohol Use: No    Family History  Problem Relation Age of Onset  . Asthma Father   . Hypertension Father     Medication list has been reviewed and updated.  Physical Examination:  Physical Exam  Constitutional: She is oriented to person, place, and time.  HENT:  Head: Normocephalic and atraumatic.  Right Ear: Hearing, tympanic membrane, external ear and ear canal normal.  Left Ear: Hearing, tympanic membrane, external ear and ear canal normal.  Nose: Mucosal edema present.  Mouth/Throat: Uvula is midline, oropharynx is clear and moist and mucous membranes are normal.  Eyes: Conjunctivae, EOM and lids are normal. Pupils are equal, round, and reactive to  light. Right eye exhibits no discharge. Left eye exhibits no discharge. No scleral icterus.  Neck: Trachea normal. Carotid bruit is not present. Thyromegaly (mild, bilateral, soft) present.  Cardiovascular: Normal rate,  regular rhythm, normal heart sounds, intact distal pulses and normal pulses.   No murmur heard. Pulmonary/Chest: Effort normal and breath sounds normal. She has no wheezes. She has no rhonchi. She has no rales.  Abdominal: Soft. Normal appearance and bowel sounds are normal. She exhibits no abdominal bruit. There is no tenderness. There is no CVA tenderness.  Genitourinary: There is breast tenderness (diffuse, bilateral). No breast swelling, discharge or bleeding.  Musculoskeletal: Normal range of motion.  Lymphadenopathy:       Head (right side): No submental, no submandibular, no tonsillar, no preauricular, no posterior auricular and no occipital adenopathy present.       Head (left side): No submental, no submandibular, no tonsillar, no preauricular, no posterior auricular and no occipital adenopathy present.    She has no cervical adenopathy.    She has no axillary adenopathy.       Right: No supraclavicular adenopathy present.       Left: No supraclavicular adenopathy present.  Neurological: She is alert and oriented to person, place, and time. She has normal strength and normal reflexes. No cranial nerve deficit or sensory deficit. Coordination and gait normal.  Skin: Skin is warm, dry and intact. No lesion and no rash noted.  1 cm mobile cyst-like structure in right axilla. No tenderness.  Psychiatric: She has a normal mood and affect. Her speech is normal and behavior is normal. Thought content normal.   BP 110/68 mmHg  Pulse 73  Temp(Src) 98.2 F (36.8 C) (Oral)  Resp 16  Ht 5\' 3"  (1.6 m)  Wt 159 lb 3.2 oz (72.213 kg)  BMI 28.21 kg/m2  SpO2 99%  LMP 09/12/2014  Results for orders placed or performed in visit on 11/01/14  POCT urine pregnancy  Result Value Ref Range   Preg Test, Ur Positive (A) Negative   Assessment and Plan:  1. Annual physical exam Labs pending. Continue exercise and diet, maintain weight during pregnancy. All health maintenance up to date. Return for  CPE in 1 year. - CBC  2. Absent menses 3. Other fatigue 4. PCOS 5. Pregnancy  Urine HCG positive. Pt has dx of PCOS and did not think it possible to get pregnant. She is happy about the pregnancy but very worried about gaining weight. We discussed the importance of her continuing her healthy behaviors during pregnancy. Discussed pregnancy precautions and medications/food/beverages to avoid during. Labs below pending. Referred to OB. She will start taking a prenatal vitamin daily. Return with questions/concerns. - POCT urine pregnancy - TSH - Comprehensive metabolic panel - Hemoglobin A1c - Ambulatory referral to Obstetrics / Gynecology  6. Allergic rhinitis, unspecified allergic rhinitis type She will continue zyrtec as needed - ok during pregnancy.    Roswell MinersNicole V. Dyke BrackettBush, PA-C, MHS [facility]  11/02/2014

## 2014-12-07 LAB — OB RESULTS CONSOLE RUBELLA ANTIBODY, IGM: Rubella: IMMUNE

## 2014-12-07 LAB — OB RESULTS CONSOLE HIV ANTIBODY (ROUTINE TESTING): HIV: NONREACTIVE

## 2014-12-07 LAB — OB RESULTS CONSOLE RPR: RPR: NONREACTIVE

## 2015-04-28 NOTE — L&D Delivery Note (Signed)
Vaginal Delivery Note The pt utilized an epidural as pain management.   Spontaneous rupture of membranes on 06/01/15, at 1045, clear.  GBS negative.  Cervical dilation was complete at  0308.  NICHD Category 2.    Pushing with guidance began at  0318.   After 22 minutes of pushing the head, shoulders and the body of a viable female infant "Aria" delivered spontaneously with maternal effort in the ROA position at 0340.  Loose [place] x 2, infant somersaulted thru without difficulty.   With vigorous tone and a delayed cry, the infant was placed on moms abd.  After tactile stimulation and bulb suction a moderate cry was received.    The umbilical cord was clamped it was cut by the FOB, then cord blood was obtained for evaluation.  The infant was taken to the warmer for further evaluation by the nursing team.  Pulse ox was placed, deep suction was done.  Cord gas collection. Apgar 7/9  Spontaneous delivery of a intact placenta with a 3 vessel cord via [place] at  [phone].   Episiotomy: None   The vulva, perineum, vaginal vault, rectum and cervix were inspected and revealed a superficial left periurethral laceration which was repaired using a 4-0 vicryl on a SH needle with 20cc of 1% lidocaine.  Patient tolerated repair well.   Postpartum pitocin as ordered.  Fundus firm, lochia minimum, bleeding under control. EBL 150, Pt hemodynamically stable.   Sponge, laps and needle count correct and verified with the primary care nurse.  Attending MD available at all times.    Routine postpartum orders   Mother unsure about method of contraception  Mom plans to breastfeed  Placenta to pathology: NO     Cord Gases sent to lab: YES Cord blood sent to lab: YES   APGARS:  7 at 1 minute and 9 at 5 minutes Weight:. 5lb 4.8oz     Both mom and baby were left in stable condition, baby skin to skin.      Brenda Molina, CNM, MSN 06/06/2015. 4:29 AM

## 2015-06-05 ENCOUNTER — Inpatient Hospital Stay (HOSPITAL_COMMUNITY)
Admission: AD | Admit: 2015-06-05 | Discharge: 2015-06-10 | DRG: 775 | Disposition: A | Discharge status: Home / Self Care | Payer: Managed Care, Other (non HMO) | Source: Ambulatory Visit | Attending: Obstetrics and Gynecology | Admitting: Obstetrics and Gynecology

## 2015-06-05 ENCOUNTER — Inpatient Hospital Stay (HOSPITAL_COMMUNITY): Payer: Managed Care, Other (non HMO) | Admitting: Anesthesiology

## 2015-06-05 ENCOUNTER — Encounter (HOSPITAL_COMMUNITY): Payer: Self-pay | Admitting: *Deleted

## 2015-06-05 ENCOUNTER — Inpatient Hospital Stay (HOSPITAL_COMMUNITY): Payer: Managed Care, Other (non HMO)

## 2015-06-05 DIAGNOSIS — R05 Cough: Secondary | ICD-10-CM

## 2015-06-05 DIAGNOSIS — O1423 HELLP syndrome (HELLP), third trimester: Secondary | ICD-10-CM | POA: Diagnosis present

## 2015-06-05 DIAGNOSIS — R059 Cough, unspecified: Secondary | ICD-10-CM

## 2015-06-05 DIAGNOSIS — R0600 Dyspnea, unspecified: Secondary | ICD-10-CM | POA: Diagnosis not present

## 2015-06-05 DIAGNOSIS — Z3A36 36 weeks gestation of pregnancy: Secondary | ICD-10-CM

## 2015-06-05 DIAGNOSIS — J90 Pleural effusion, not elsewhere classified: Secondary | ICD-10-CM | POA: Diagnosis not present

## 2015-06-05 DIAGNOSIS — O1410 Severe pre-eclampsia, unspecified trimester: Secondary | ICD-10-CM | POA: Diagnosis present

## 2015-06-05 DIAGNOSIS — O9952 Diseases of the respiratory system complicating childbirth: Secondary | ICD-10-CM | POA: Diagnosis present

## 2015-06-05 DIAGNOSIS — O42913 Preterm premature rupture of membranes, unspecified as to length of time between rupture and onset of labor, third trimester: Secondary | ICD-10-CM | POA: Diagnosis present

## 2015-06-05 DIAGNOSIS — O99344 Other mental disorders complicating childbirth: Secondary | ICD-10-CM | POA: Diagnosis present

## 2015-06-05 DIAGNOSIS — Z6791 Unspecified blood type, Rh negative: Secondary | ICD-10-CM | POA: Diagnosis present

## 2015-06-05 DIAGNOSIS — O114 Pre-existing hypertension with pre-eclampsia, complicating childbirth: Secondary | ICD-10-CM | POA: Diagnosis present

## 2015-06-05 DIAGNOSIS — O1424 HELLP syndrome, complicating childbirth: Secondary | ICD-10-CM | POA: Diagnosis present

## 2015-06-05 DIAGNOSIS — F419 Anxiety disorder, unspecified: Secondary | ICD-10-CM | POA: Diagnosis present

## 2015-06-05 DIAGNOSIS — J811 Chronic pulmonary edema: Secondary | ICD-10-CM | POA: Diagnosis not present

## 2015-06-05 DIAGNOSIS — R0602 Shortness of breath: Secondary | ICD-10-CM

## 2015-06-05 DIAGNOSIS — O42919 Preterm premature rupture of membranes, unspecified as to length of time between rupture and onset of labor, unspecified trimester: Secondary | ICD-10-CM

## 2015-06-05 DIAGNOSIS — J45909 Unspecified asthma, uncomplicated: Secondary | ICD-10-CM | POA: Diagnosis present

## 2015-06-05 LAB — PROTEIN / CREATININE RATIO, URINE
Creatinine, Urine: 300 mg/dL
Protein Creatinine Ratio: 4.96 mg/mg{Cre} — ABNORMAL HIGH (ref 0.00–0.15)
Total Protein, Urine: 1488 mg/dL

## 2015-06-05 LAB — COMPREHENSIVE METABOLIC PANEL
ALT: 47 U/L (ref 14–54)
ALT: 48 U/L (ref 14–54)
AST: 42 U/L — ABNORMAL HIGH (ref 15–41)
AST: 43 U/L — ABNORMAL HIGH (ref 15–41)
Albumin: 2.7 g/dL — ABNORMAL LOW (ref 3.5–5.0)
Albumin: 2.8 g/dL — ABNORMAL LOW (ref 3.5–5.0)
Alkaline Phosphatase: 181 U/L — ABNORMAL HIGH (ref 38–126)
Alkaline Phosphatase: 200 U/L — ABNORMAL HIGH (ref 38–126)
Anion gap: 11 (ref 5–15)
Anion gap: 12 (ref 5–15)
BUN: 10 mg/dL (ref 6–20)
BUN: 9 mg/dL (ref 6–20)
CO2: 22 mmol/L (ref 22–32)
CO2: 21 mmol/L — ABNORMAL LOW (ref 22–32)
Calcium: 8.5 mg/dL — ABNORMAL LOW (ref 8.9–10.3)
Calcium: 8.6 mg/dL — ABNORMAL LOW (ref 8.9–10.3)
Chloride: 103 mmol/L (ref 101–111)
Chloride: 104 mmol/L (ref 101–111)
Creatinine, Ser: 0.77 mg/dL (ref 0.44–1.00)
Creatinine, Ser: 0.77 mg/dL (ref 0.44–1.00)
GFR calc Af Amer: >60 mL/min (ref >60)
GFR calc Af Amer: >60 mL/min (ref >60)
GFR calc non Af Amer: >60 mL/min (ref >60)
GFR calc non Af Amer: >60 mL/min (ref >60)
Glucose, Bld: 103 mg/dL — ABNORMAL HIGH (ref 65–99)
Glucose, Bld: 118 mg/dL — ABNORMAL HIGH (ref 65–99)
Potassium: 3.6 mmol/L (ref 3.5–5.1)
Potassium: 4.1 mmol/L (ref 3.5–5.1)
Sodium: 136 mmol/L (ref 135–145)
Sodium: 137 mmol/L (ref 135–145)
Total Bilirubin: 1 mg/dL (ref 0.3–1.2)
Total Bilirubin: 1.1 mg/dL (ref 0.3–1.2)
Total Protein: 5.7 g/dL — ABNORMAL LOW (ref 6.5–8.1)
Total Protein: 5.5 g/dL — ABNORMAL LOW (ref 6.5–8.1)

## 2015-06-05 LAB — CBC
HCT: 32.6 % — ABNORMAL LOW (ref 36.0–46.0)
HCT: 33.2 % — ABNORMAL LOW (ref 36.0–46.0)
Hemoglobin: 11.1 g/dL — ABNORMAL LOW (ref 12.0–15.0)
Hemoglobin: 11.2 g/dL — ABNORMAL LOW (ref 12.0–15.0)
MCH: 25.5 pg — ABNORMAL LOW (ref 26.0–34.0)
MCH: 25.3 pg — ABNORMAL LOW (ref 26.0–34.0)
MCHC: 34 g/dL (ref 30.0–36.0)
MCHC: 33.7 g/dL (ref 30.0–36.0)
MCV: 74.9 fL — ABNORMAL LOW (ref 78.0–100.0)
MCV: 75.1 fL — ABNORMAL LOW (ref 78.0–100.0)
Platelets: 93 10*3/uL — ABNORMAL LOW (ref 150–400)
Platelets: 80 10*3/uL — ABNORMAL LOW (ref 150–400)
RBC: 4.35 MIL/uL (ref 3.87–5.11)
RBC: 4.42 MIL/uL (ref 3.87–5.11)
RDW: 15.5 % (ref 11.5–15.5)
RDW: 15.6 % — ABNORMAL HIGH (ref 11.5–15.5)
WBC: 12.2 10*3/uL — ABNORMAL HIGH (ref 4.0–10.5)
WBC: 13.9 10*3/uL — ABNORMAL HIGH (ref 4.0–10.5)

## 2015-06-05 LAB — LACTATE DEHYDROGENASE
LDH: 322 U/L — ABNORMAL HIGH (ref 98–192)
LDH: 345 U/L — ABNORMAL HIGH (ref 98–192)

## 2015-06-05 LAB — URIC ACID
Uric Acid, Serum: 6.1 mg/dL (ref 2.3–6.6)
Uric Acid, Serum: 6.4 mg/dL (ref 2.3–6.6)

## 2015-06-05 LAB — MAGNESIUM: Magnesium: 3.9 mg/dL — ABNORMAL HIGH (ref 1.7–2.4)

## 2015-06-05 IMAGING — CR DG CHEST 1V PORT
1 series · 1 of 1 positions shown · non-contrast
Comparison: None.

CLINICAL DATA: Acute onset of chest discomfort.  Initial encounter.

EXAM:
PORTABLE CHEST 1 VIEW

[view not recorded]
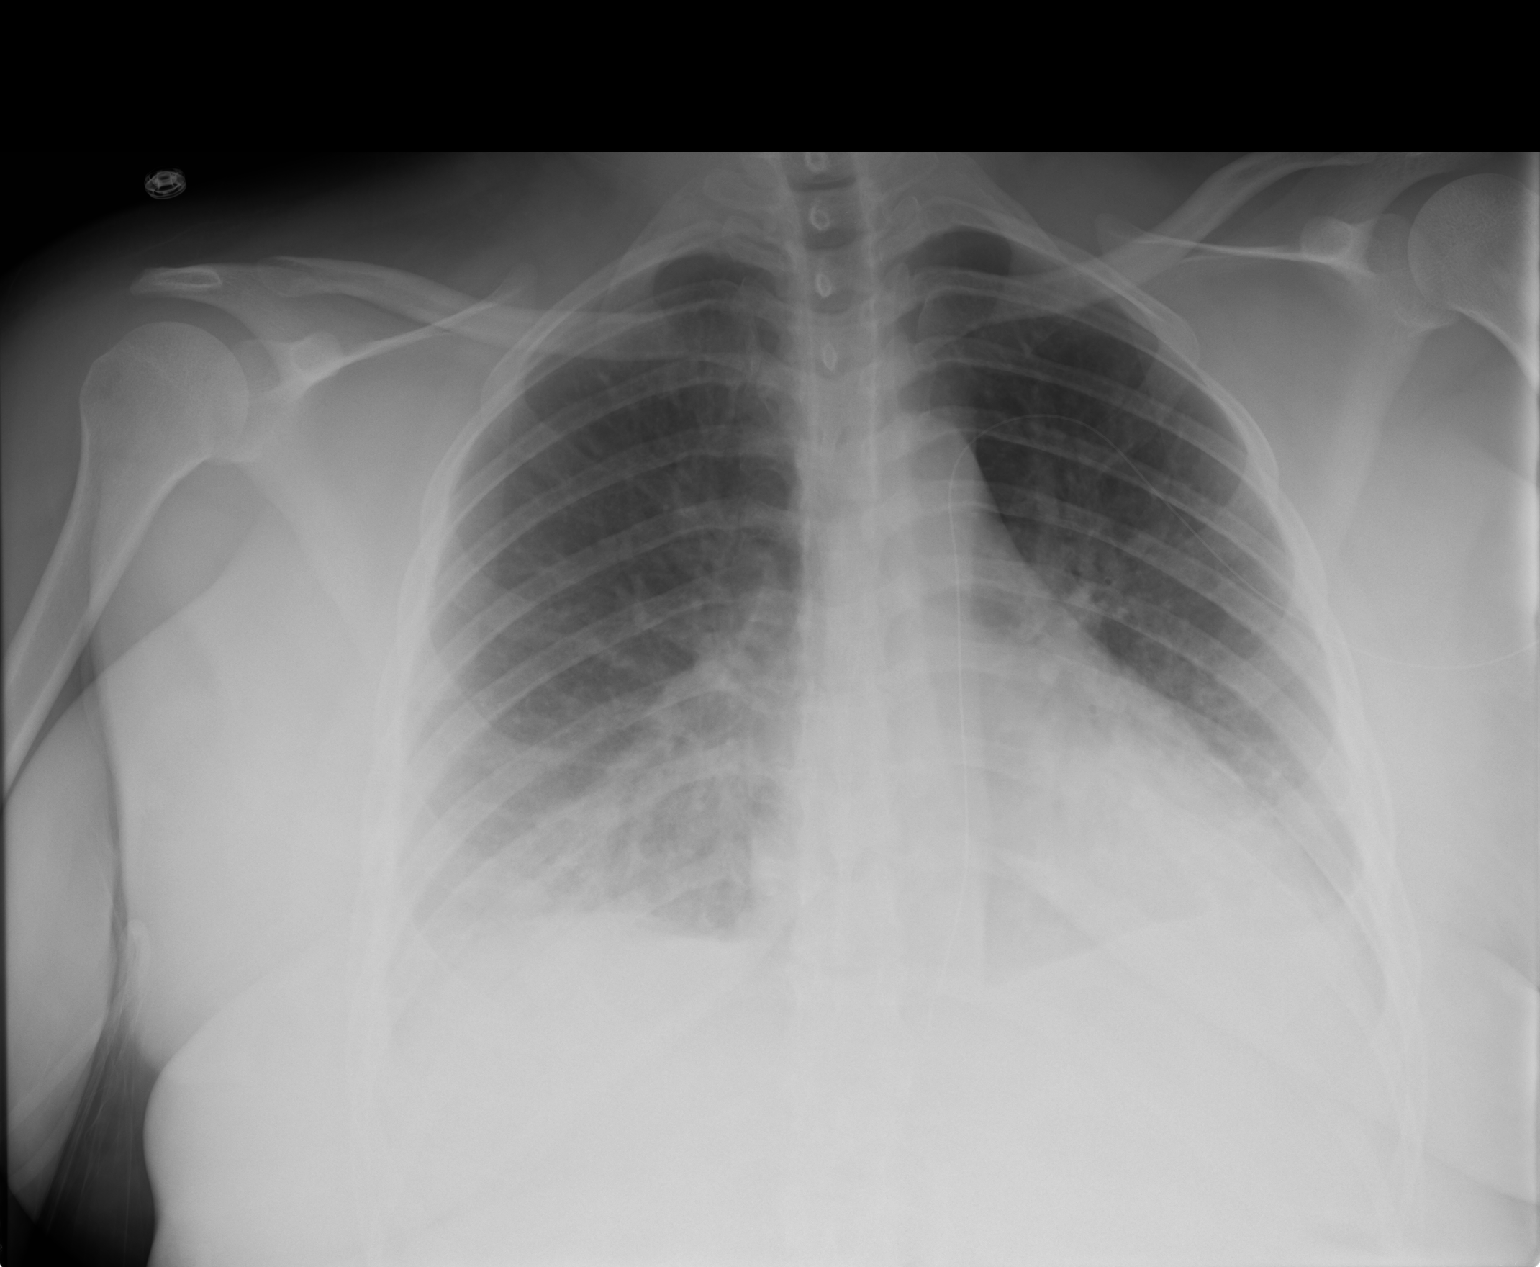

[1 of 1 positions shown; findings below may reference images not displayed]

FINDINGS: The lungs are well-aerated. Vascular congestion is noted. Mild
bibasilar opacities raise concern for mild interstitial edema. There
is no evidence of pleural effusion or pneumothorax.

The cardiomediastinal silhouette is borderline normal in size. No
acute osseous abnormalities are seen.
IMPRESSION: Vascular congestion noted. Mild bibasilar opacities raise concern
for mild interstitial edema.

## 2015-06-05 MED ORDER — OXYTOCIN 10 UNIT/ML IJ SOLN: 2.5000 [IU]/h | INTRAVENOUS | Status: DC

## 2015-06-05 MED ORDER — BETAMETHASONE SOD PHOS & ACET 6 (3-3) MG/ML IJ SUSP
12.0000 mg | Freq: Once | INTRAMUSCULAR | Status: AC
  Administered 2015-06-06: 12 mg via INTRAMUSCULAR
  Filled 2015-06-05: qty 2

## 2015-06-05 MED ORDER — MAGNESIUM SULFATE BOLUS VIA INFUSION
4.0000 g | Freq: Once | INTRAVENOUS | Status: AC
  Administered 2015-06-05: 4 g via INTRAVENOUS
  Filled 2015-06-05: qty 500

## 2015-06-05 MED ORDER — DIPHENHYDRAMINE HCL 50 MG/ML IJ SOLN: 12.5000 mg | INTRAMUSCULAR | Status: DC | PRN

## 2015-06-05 MED ORDER — LABETALOL HCL 5 MG/ML IV SOLN
20.0000 mg | INTRAVENOUS | Status: DC | PRN
  Administered 2015-06-05: 40 mg via INTRAVENOUS
  Administered 2015-06-05: 80 mg via INTRAVENOUS
  Filled 2015-06-05: qty 8

## 2015-06-05 MED ORDER — LACTATED RINGERS IV SOLN
INTRAVENOUS | Status: DC
  Administered 2015-06-05: 15:00:00 via INTRAVENOUS

## 2015-06-05 MED ORDER — MENTHOL 3 MG MT LOZG
1.0000 | LOZENGE | OROMUCOSAL | Status: DC | PRN
  Filled 2015-06-05: qty 9

## 2015-06-05 MED ORDER — FENTANYL 2.5 MCG/ML BUPIVACAINE 1/10 % EPIDURAL INFUSION (WH - ANES)
14.0000 mL/h | INTRAMUSCULAR | Status: DC | PRN
  Administered 2015-06-05: 14 mL/h via EPIDURAL
  Filled 2015-06-05 (×2): qty 125

## 2015-06-05 MED ORDER — LACTATED RINGERS IV SOLN
INTRAVENOUS | Status: DC
  Administered 2015-06-05: via INTRAUTERINE

## 2015-06-05 MED ORDER — LABETALOL HCL 5 MG/ML IV SOLN
20.0000 mg | INTRAVENOUS | Status: DC | PRN
  Administered 2015-06-05: 20 mg via INTRAVENOUS
  Filled 2015-06-05: qty 16

## 2015-06-05 MED ORDER — ACETAMINOPHEN 325 MG PO TABS: 650.0000 mg | ORAL_TABLET | ORAL | Status: DC | PRN

## 2015-06-05 MED ORDER — OXYCODONE-ACETAMINOPHEN 5-325 MG PO TABS: 2.0000 | ORAL_TABLET | ORAL | Status: DC | PRN

## 2015-06-05 MED ORDER — LABETALOL HCL 5 MG/ML IV SOLN
INTRAVENOUS | Status: AC
  Administered 2015-06-05: 20 mg via INTRAVENOUS
  Filled 2015-06-05: qty 4

## 2015-06-05 MED ORDER — HYDRALAZINE HCL 20 MG/ML IJ SOLN
10.0000 mg | Freq: Once | INTRAMUSCULAR | Status: AC | PRN
  Administered 2015-06-06: 10 mg via INTRAVENOUS
  Filled 2015-06-05 (×2): qty 1

## 2015-06-05 MED ORDER — MISOPROSTOL 25 MCG QUARTER TABLET: 25.0000 ug | ORAL_TABLET | ORAL | Status: DC | PRN

## 2015-06-05 MED ORDER — LACTATED RINGERS IV SOLN
2.0000 g/h | INTRAVENOUS | Status: DC
  Filled 2015-06-05: qty 80

## 2015-06-05 MED ORDER — TERBUTALINE SULFATE 1 MG/ML IJ SOLN: 0.2500 mg | Freq: Once | INTRAMUSCULAR | Status: DC | PRN

## 2015-06-05 MED ORDER — PHENYLEPHRINE 40 MCG/ML (10ML) SYRINGE FOR IV PUSH (FOR BLOOD PRESSURE SUPPORT)
80.0000 ug | PREFILLED_SYRINGE | INTRAVENOUS | Status: DC | PRN
  Filled 2015-06-05 (×2): qty 20

## 2015-06-05 MED ORDER — LIDOCAINE HCL (PF) 1 % IJ SOLN
30.0000 mL | INTRAMUSCULAR | Status: AC | PRN
  Administered 2015-06-06: 30 mL via SUBCUTANEOUS
  Filled 2015-06-05: qty 30

## 2015-06-05 MED ORDER — LACTATED RINGERS IV SOLN: INTRAVENOUS | Status: DC

## 2015-06-05 MED ORDER — OXYCODONE-ACETAMINOPHEN 5-325 MG PO TABS: 1.0000 | ORAL_TABLET | ORAL | Status: DC | PRN

## 2015-06-05 MED ORDER — FLEET ENEMA 7-19 GM/118ML RE ENEM: 1.0000 | ENEMA | RECTAL | Status: DC | PRN

## 2015-06-05 MED ORDER — EPHEDRINE 5 MG/ML INJ: 10.0000 mg | INTRAVENOUS | Status: DC | PRN

## 2015-06-05 MED ORDER — CITRIC ACID-SODIUM CITRATE 334-500 MG/5ML PO SOLN: 30.0000 mL | ORAL | Status: DC | PRN

## 2015-06-05 MED ORDER — BETAMETHASONE SOD PHOS & ACET 6 (3-3) MG/ML IJ SUSP
12.0000 mg | Freq: Once | INTRAMUSCULAR | Status: AC
  Administered 2015-06-05: 12 mg via INTRAMUSCULAR
  Filled 2015-06-05: qty 2

## 2015-06-05 MED ORDER — ONDANSETRON HCL 4 MG/2ML IJ SOLN: 4.0000 mg | Freq: Four times a day (QID) | INTRAMUSCULAR | Status: DC | PRN

## 2015-06-05 MED ORDER — LIDOCAINE HCL (PF) 1 % IJ SOLN
INTRAMUSCULAR | Status: DC | PRN
  Administered 2015-06-05 (×2): 5 mL

## 2015-06-05 MED ORDER — OXYTOCIN BOLUS FROM INFUSION
500.0000 mL | INTRAVENOUS | Status: DC
  Administered 2015-06-06: 500 mL via INTRAVENOUS

## 2015-06-05 MED ORDER — HYDRALAZINE HCL 20 MG/ML IJ SOLN
10.0000 mg | Freq: Once | INTRAMUSCULAR | Status: AC | PRN
  Administered 2015-06-05: 10 mg via INTRAVENOUS

## 2015-06-05 MED ORDER — OXYTOCIN 10 UNIT/ML IJ SOLN
1.0000 m[IU]/min | INTRAMUSCULAR | Status: DC
  Administered 2015-06-05: 1 m[IU]/min via INTRAVENOUS
  Filled 2015-06-05: qty 4

## 2015-06-05 MED ORDER — GUAIFENESIN 100 MG/5ML PO SOLN
5.0000 mL | ORAL | Status: DC | PRN
  Administered 2015-06-05: 100 mg via ORAL
  Filled 2015-06-05: qty 15

## 2015-06-05 MED ORDER — FENTANYL CITRATE (PF) 100 MCG/2ML IJ SOLN: 50.0000 ug | INTRAMUSCULAR | Status: DC | PRN

## 2015-06-05 MED ORDER — LACTATED RINGERS IV SOLN
500.0000 mL | INTRAVENOUS | Status: DC | PRN
  Administered 2015-06-05: 500 mL via INTRAVENOUS

## 2015-06-05 NOTE — Progress Notes (Addendum)
Labor Progress  Subjective: No complaints.  Pt in high fowlers having a clear diet dinner.  Family and the bedside. Pt report a Hx of asthma that is aggravitate by weather change. Her last attach was 2 years ago    Objective: BP 161/99 mmHg  Pulse 101  Temp(Src) 98.3 F (36.8 C) (Oral)  Resp 18  Ht  (1.6 m)  Wt 242 lb (109.77 kg)  BMI 42.88 kg/m2  SpO2 99%  LMP 09/12/2014 I/O last 3 completed shifts: In: 1054.4 [P.O.:190; I.V.:864.4] Out: 100 [Urine:100]    Filed Vitals:   06/05/15 1940 06/05/15 1949 06/05/15 2000 06/05/15 2015  BP: 195/115 160/95 165/104 161/99  Pulse: 96 97 95 101  Temp:   98.3 F (36.8 C)   TempSrc:   Oral   Resp: Height:      Weight:      SpO2:       Diminished lung sounds bilaterally  Asthma like cough, no wheezing noted.  FHT: 140, moderate variability occasion accel, no decels CTX:  regular, every 3-4 minutes Uterus gravid, soft non tender SVE:  Dilation: 3.5 Effacement (%): 90 Station: -1 Exam by:: Jaivyn Gulla Pitocin at 35mUn/min hydralizine   Assessment:  IUP at 36.0 weeks NICHD: Category 1 Membranes:  SROM x since 06/01/15 at 1045, no s/s of infection Labor progress: IOL Pitocin Augmentation GBS: unknown    Plan: Continue labor plan Continuous monitoring Rest Frequent position changes to facilitate fetal rotation and descent. Will reassess with cervical exam at 2300 or earlier if necessary  Continue pitocin per protocol PIH and Mag labs 0430 CXR GBS PCR Foley cath Strict I&O Mag level   Epidural per pt request   Siddhi Dornbush, CNM, MSN 06/05/2015. 8:26 PM

## 2015-06-05 NOTE — Anesthesia Procedure Notes (Signed)
Epidural Patient location during procedure: OB Start time: 06/05/2015 5:28 PM End time: 06/05/2015 5:30 PM  Staffing Anesthesiologist: Leilani Able  Preanesthetic Checklist Completed: patient identified, surgical consent, pre-op evaluation, timeout performed, IV checked, risks and benefits discussed and monitors and equipment checked  Epidural Patient position: sitting Prep: site prepped and draped and DuraPrep Patient monitoring: continuous pulse ox and blood pressure Approach: midline Location: L3-L4 Injection technique: LOR air  Needle:  Needle type: Tuohy  Needle gauge: 17 G Needle length: 9 cm and 9 Needle insertion depth: 7 cm Catheter type: closed end flexible Catheter size: 19 Gauge Catheter at skin depth: 14 cm  Assessment Events: blood not aspirated, injection not painful, no injection resistance, negative IV test and no paresthesia  Additional Notes Reason for block:procedure for pain

## 2015-06-05 NOTE — MAU Note (Signed)
Sent from office, leaking since Sat- clear fluid, no bleeding. Denies HA or epigastric pain.  Feet are very swollen, having some blurring.

## 2015-06-05 NOTE — Anesthesia Preprocedure Evaluation (Signed)
Anesthesia Evaluation  Patient identified by MRN, date of birth, ID band Patient awake    Reviewed: Allergy & Precautions, H&P , NPO status , Patient's Chart, lab work & pertinent test results  Airway Mallampati: II  TM Distance: >3 FB Neck ROM: full    Dental no notable dental hx.    Pulmonary neg pulmonary ROS,    Pulmonary exam normal        Cardiovascular Normal cardiovascular exam     Neuro/Psych negative neurological ROS     GI/Hepatic negative GI ROS, Neg liver ROS,   Endo/Other  Morbid obesity  Renal/GU negative Renal ROS     Musculoskeletal   Abdominal (+) + obese,   Peds  Hematology negative hematology ROS (+)   Anesthesia Other Findings   Reproductive/Obstetrics (+) Pregnancy                             Anesthesia Physical Anesthesia Plan  ASA: III  Anesthesia Plan: Epidural   Post-op Pain Management:    Induction:   Airway Management Planned:   Additional Equipment:   Intra-op Plan:   Post-operative Plan:   Informed Consent: I have reviewed the patients History and Physical, chart, labs and discussed the procedure including the risks, benefits and alternatives for the proposed anesthesia with the patient or authorized representative who has indicated his/her understanding and acceptance.     Plan Discussed with:   Anesthesia Plan Comments:         Anesthesia Quick Evaluation  

## 2015-06-05 NOTE — H&P (Signed)
Brenda Molina is a 30 y.o. female, G1P0 at 36.0 weeks, presenting for Prolonged rupture of membranes and elevated BP of  184/110 and 184/114.  Pt attended her scheduled prenatal visit today at [facility].  Pt reported feeling dizzy.  It was also noted by sterile vaginal exam with  that her membranes where ruptured with postive pooling and positive nitrize.  Pt sent for direct admit to labor and delivery for induction of labor.   There are no active problems to display for this patient.   History of present pregnancy: Patient entered care at 12.2 weeks.   EDC of 07/03/15 was established by early [place] at 13 wks.    Anatomy scan:  19 weeks (02/06/15), with normal findings and an posterior placenta.    277 grams,Cervix closed,No previa Not seen=placental cord insertion, spine, cerebellum,cisterna magna due to  fetal position.   Additional [place] evaluations:   15.1 wks Dating, unsure LMP (12/27/14):  SIUP +FHT, anteverted uterus, normal fluids, placenta appears to be anterior,  CRL=GA of [redacted]w[redacted]d with an EDD of 07/03/2015.  [place] is best EDD. CX closed, RTOV: wnlsLTOV: not seen.  Adnexas unremarkable. 25.6  wks  F/u anatomy  (03/26/15): vertex, posterior placenta. No previa, Placental cord insertion, spine, cerebellum  and cisterna magna seen. Anatomy c/c   Significant prenatal events: First Trimester:   C/o left leg numbness; unplanned pregnancy- initially reported she did not have plans to keep the baby Second Trimester:   Continued complaints of left leg numbness and pain, referral for neurology  Third Trimester:  31wks sinus/cold.  Extreme amount of swelling noted at 35 wks   Last evaluation: 36.0wks on 06/05/15 by N. Dillard, MD: FHR 144, +FM,  SVE: 1/70/-3,   BP 184/110, 246 lbs, +2 edema 1 WK ROB AND GBS. Pt. c/o bk pain only. TDAP/Flu: current No recent MAU visits. Pt. states she is feeling dizzy. 1st BP: 184/110 (lft arm); 2nd BP: 184/114( rt arm) Pt. presents no other problems/concerns  today. LJ CMA STERILE SPEC WITH POOLING, NITRIZENE POS TO L&D FOR ADMIT PT WITH PROLONGED ROM. GBS DONE ALSO R/O PIH AND PREEECLAMPSIA  OB History    Gravida Para Term Preterm AB TAB SAB Ectopic Multiple Living   1              Past Medical History  Diagnosis Date  . PCOS (polycystic ovarian syndrome)   . Anxiety   . Allergy    No past surgical history on file. Family History: family history includes Asthma in her father; Hypertension in her father. Social History:  reports that she has never smoked. She does not have any smokeless tobacco history on file. She reports that she does not drink alcohol or use illicit drugs.   African American, 2 year college, Single,   Prenatal Transfer Tool  Maternal Diabetes: No Genetic Screening: Declined Maternal Ultrasounds/Referrals: Normal Fetal Ultrasounds or other Referrals:  None Maternal Substance Abuse:  No Significant Maternal Medications:  None Significant Maternal Lab Results: Lab values include: Other: GBS unknown- pending  TDAP 05/02/15 Flu  01/24/15 Rhogam 05/02/15  ROS:  Occasional contractions, Blurry vision, +FM, lof, no vb, no HA, no epigastric pain,   No Known Allergies    Last menstrual period 09/12/2014.  Chest clear Heart RRR without murmur Abd gravid, NT, FH  Pelvic: 1/80/-2 Ext: Lower extremity +2 pitting edema   FHR: Category 1, 145 bpm, moderate variability, +accels, no decels UCs:  Irregular,  Every  Prenatal labs:  ABO, Rh:    B,negative 12/07/14 Antibody:   negative 12/07/14 Rubella:  !Error!  Immune 12/07/14 RPR:     NR   12/07/14 HBsAg:     Negative  12/07/14 HIV:    NR 12/07/14 GBS:      PENDING Sickle cell/Hgb electrophoresis:  unknown Pap:  wnl 01/09/14 GC:  Negative  01/24/15 Chlamydia:  Negative     01/24/15 Genetic screenings:  Normal  Glucola:  normal Other:   Hgb 13.2 at NOB, 12.6 at 28 weeks    Results for orders placed or performed during the hospital encounter of 06/05/15 (from the  past 24 hour(s))  Type and screen [facility] [place]     Status: None (Preliminary result)   Collection Time: 06/05/15  2:42 PM  Result Value Ref Range   ABO/RH(D) B NEG    Antibody Screen POS    Sample Expiration 06/08/2015    Antibody Identification PASSIVELY ACQUIRED ANTI-D    DAT, IgG NEG    Unit Number [id number]    Blood Component Type RBC LR PHER2    Unit division 00    Status of Unit ALLOCATED    Transfusion Status OK TO TRANSFUSE    Crossmatch Result COMPATIBLE    Unit Number [id number]    Blood Component Type RED CELLS,LR    Unit division 00    Status of Unit ALLOCATED    Transfusion Status OK TO TRANSFUSE    Crossmatch Result COMPATIBLE   CBC     Status: Abnormal   Collection Time: 06/05/15  2:43 PM  Result Value Ref Range   WBC 12.2 (H) 4.0 - 10.5 K/uL   RBC 4.35 3.87 - 5.11 MIL/uL   Hemoglobin 11.1 (L) 12.0 - 15.0 g/dL   HCT 03.4 (L) 74.2 - 59.5 %   MCV 74.9 (L) 78.0 - 100.0 fL   MCH 25.5 (L) 26.0 - 34.0 pg   MCHC 34.0 30.0 - 36.0 g/dL   RDW 63.8 75.6 - 43.3 %   Platelets 93 (L) 150 - 400 K/uL  Comprehensive metabolic panel     Status: Abnormal   Collection Time: 06/05/15  2:43 PM  Result Value Ref Range   Sodium 136 135 - 145 mmol/L   Potassium 3.6 3.5 - 5.1 mmol/L   Chloride 103 101 - 111 mmol/L   CO2 22 22 - 32 mmol/L   Glucose, Bld 103 (H) 65 - 99 mg/dL   BUN 10 6 - 20 mg/dL   Creatinine, Ser 2.95 0.44 - 1.00 mg/dL   Calcium 8.5 (L) 8.9 - 10.3 mg/dL   Total Protein 5.7 (L) 6.5 - 8.1 g/dL   Albumin 2.7 (L) 3.5 - 5.0 g/dL   AST 42 (H) 15 - 41 U/L   ALT 47 14 - 54 U/L   Alkaline Phosphatase 181 (H) 38 - 126 U/L   Total Bilirubin 1.0 0.3 - 1.2 mg/dL   GFR calc non Af Amer >60 >60 mL/min   GFR calc Af Amer >60 >60 mL/min   Anion gap 11 5 - 15  Lactate dehydrogenase     Status: Abnormal   Collection Time: 06/05/15  2:43 PM  Result Value Ref Range   LDH 322 (H) 98 - 192 U/L  Uric acid     Status: None   Collection Time: 06/05/15   2:43 PM  Result Value Ref Range   Uric Acid, Serum 6.1 2.3 - 6.6 mg/dL  Protein / creatinine ratio, urine  Status: Abnormal   Collection Time: 06/05/15  3:00 PM  Result Value Ref Range   Creatinine, Urine 300.00 mg/dL   Total Protein, Urine 1488 mg/dL   Protein Creatinine Ratio 4.96 (H) 0.00 - 0.15 mg/mg[Cre]     Assessment/Plan: IUP at 36.0 Prolonged -Preterm Premature Rupture of membranes HELLP syndrome, platelets 93 Pre-Eclampsia with severe features SVE: 1/80/-2 , posterior RhD negative GBS pending   Plan: Admit to Birthing Suite per consult with Dr. Estanislado Pandy Begin Magnesium Sulfate Infusion, 4g loading with 2g continuous infusion Repeat PIH labs  At 2030, six hours after initial draw,   R&B of pitocin induction discussed with patient, patient seemed to understand Begin Piticin induction1x2 per protocol  Administer initial Betamethasome injection now Second Betamethasone injection to be administered 12 hrs after initial injection administration Consult with Anesthesia, platelets 93 Routine [facility] orders  Pain med/epidural prn GBS still pending - PCN G for GBS prophylaxis, if warranted      Beatrix Fetters, [place] 06/05/2015, 2:40 PM

## 2015-06-05 NOTE — Progress Notes (Addendum)
Labor Progress  Subjective: Fells better since epidural Denies difficultly breathing. On left side  Objective: BP 159/84 mmHg  Pulse 105  Temp(Src) 98.3 F (36.8 C) (Oral)  Resp 98  Ht  (1.6 m)  Wt 242 lb (109.77 kg)  BMI 42.88 kg/m2  SpO2 99%  LMP 09/12/2014 I/O last 3 completed shifts: In: 1054.4 [P.O.:190; I.V.:864.4] Out: 100 [Urine:100] Total I/O In: 100 [I.V.:100] Out: 100 [Urine:100]  FHT: CTX:  regular, every 4-6 minutes Uterus gravid, soft non tender SVE:  Dilation: 3.5 Effacement (%): 90 Station: -1 Exam by:: Norine Reddington Pitocin at 46mUn/min Breathing no longer labored.    Results for orders placed or performed during the hospital encounter of 06/05/15 (from the past 24 hour(s))  Type and screen [facility] [place]     Status: None (Preliminary result)   Collection Time: 06/05/15  2:42 PM  Result Value Ref Range   ABO/RH(D) B NEG    Antibody Screen POS    Sample Expiration 06/08/2015    Antibody Identification PASSIVELY ACQUIRED ANTI-D    DAT, IgG NEG    Unit Number [id number]    Blood Component Type RBC LR PHER2    Unit division 00    Status of Unit ALLOCATED    Transfusion Status OK TO TRANSFUSE    Crossmatch Result COMPATIBLE    Unit Number [id number]    Blood Component Type RED CELLS,LR    Unit division 00    Status of Unit ALLOCATED    Transfusion Status OK TO TRANSFUSE    Crossmatch Result COMPATIBLE   CBC     Status: Abnormal   Collection Time: 06/05/15  2:43 PM  Result Value Ref Range   WBC 12.2 (H) 4.0 - 10.5 K/uL   RBC 4.35 3.87 - 5.11 MIL/uL   Hemoglobin 11.1 (L) 12.0 - 15.0 g/dL   HCT 96.2 (L) 95.2 - 84.1 %   MCV 74.9 (L) 78.0 - 100.0 fL   MCH 25.5 (L) 26.0 - 34.0 pg   MCHC 34.0 30.0 - 36.0 g/dL   RDW 32.4 40.1 - 02.7 %   Platelets 93 (L) 150 - 400 K/uL  Comprehensive metabolic panel     Status: Abnormal   Collection Time: 06/05/15  2:43 PM  Result Value Ref Range   Sodium 136 135 - 145 mmol/L    Potassium 3.6 3.5 - 5.1 mmol/L   Chloride 103 101 - 111 mmol/L   CO2 22 22 - 32 mmol/L   Glucose, Bld 103 (H) 65 - 99 mg/dL   BUN 10 6 - 20 mg/dL   Creatinine, Ser 2.53 0.44 - 1.00 mg/dL   Calcium 8.5 (L) 8.9 - 10.3 mg/dL   Total Protein 5.7 (L) 6.5 - 8.1 g/dL   Albumin 2.7 (L) 3.5 - 5.0 g/dL   AST 42 (H) 15 - 41 U/L   ALT 47 14 - 54 U/L   Alkaline Phosphatase 181 (H) 38 - 126 U/L   Total Bilirubin 1.0 0.3 - 1.2 mg/dL   GFR calc non Af Amer >60 >60 mL/min   GFR calc Af Amer >60 >60 mL/min   Anion gap 11 5 - 15  Lactate dehydrogenase     Status: Abnormal   Collection Time: 06/05/15  2:43 PM  Result Value Ref Range   LDH 322 (H) 98 - 192 U/L  Uric acid     Status: None   Collection Time: 06/05/15  2:43 PM  Result Value Ref Range   Uric  Acid, Serum 6.1 2.3 - 6.6 mg/dL  Protein / creatinine ratio, urine     Status: Abnormal   Collection Time: 06/05/15  3:00 PM  Result Value Ref Range   Creatinine, Urine 300.00 mg/dL   Total Protein, Urine 1488 mg/dL   Protein Creatinine Ratio 4.96 (H) 0.00 - 0.15 mg/mg[Cre]  CBC     Status: Abnormal   Collection Time: 06/05/15  8:24 PM  Result Value Ref Range   WBC 13.9 (H) 4.0 - 10.5 K/uL   RBC 4.42 3.87 - 5.11 MIL/uL   Hemoglobin 11.2 (L) 12.0 - 15.0 g/dL   HCT 60.4 (L) 54.0 - 98.1 %   MCV 75.1 (L) 78.0 - 100.0 fL   MCH 25.3 (L) 26.0 - 34.0 pg   MCHC 33.7 30.0 - 36.0 g/dL   RDW 19.1 (H) 47.8 - 29.5 %   Platelets 80 (L) 150 - 400 K/uL  Lactate dehydrogenase     Status: Abnormal   Collection Time: 06/05/15  8:24 PM  Result Value Ref Range   LDH 345 (H) 98 - 192 U/L  Comprehensive metabolic panel     Status: Abnormal   Collection Time: 06/05/15  8:24 PM  Result Value Ref Range   Sodium 137 135 - 145 mmol/L   Potassium 4.1 3.5 - 5.1 mmol/L   Chloride 104 101 - 111 mmol/L   CO2 21 (L) 22 - 32 mmol/L   Glucose, Bld 118 (H) 65 - 99 mg/dL   BUN 9 6 - 20 mg/dL   Creatinine, Ser 6.21 0.44 - 1.00 mg/dL   Calcium 8.6 (L) 8.9 - 10.3 mg/dL    Total Protein 5.5 (L) 6.5 - 8.1 g/dL   Albumin 2.8 (L) 3.5 - 5.0 g/dL   AST 43 (H) 15 - 41 U/L   ALT 48 14 - 54 U/L   Alkaline Phosphatase 200 (H) 38 - 126 U/L   Total Bilirubin 1.1 0.3 - 1.2 mg/dL   GFR calc non Af Amer >60 >60 mL/min   GFR calc Af Amer >60 >60 mL/min   Anion gap 12 5 - 15  Uric acid     Status: None   Collection Time: 06/05/15  8:24 PM  Result Value Ref Range   Uric Acid, Serum 6.4 2.3 - 6.6 mg/dL  Magnesium     Status: Abnormal   Collection Time: 06/05/15  8:24 PM  Result Value Ref Range   Magnesium 3.9 (H) 1.7 - 2.4 mg/dL   Assessment:  IUP at 30.8 weeks NICHD: Category 2 Membranes:  SROM since 06/01/15 1024, no s/s of infection Labor progress: IOL MVUs 260 Pitocin Augmentation GBS: in progress Urine output 250 since 1900   Chest xray: FINDINGS: The lungs are well-aerated. Vascular congestion is noted. Mild bibasilar opacities raise concern for mild interstitial edema. There is no evidence of pleural effusion or pneumothorax.  The cardiomediastinal silhouette is borderline normal in size. No acute osseous abnormalities are seen.  IMPRESSION: Vascular congestion noted. Mild bibasilar opacities raise concern for mild interstitial edema.  Clarification: Mild Pulmonary edema per Dr Roanna Raider    Plan: Continue labor plan Continuous monitoring Frequent position changes to facilitate fetal rotation and descent. Will reassess with cervical exam at 0100 or earlier if necessary  Continue pitocin per protocol Amnioinfusion with bolus of 300 cc, then 150 cc/hr. Lasix  x1 Hourly I&O Consult with Dr Chip Boer Loeta Herst, CNM, MSN 06/05/2015. 11:23 PM

## 2015-06-06 ENCOUNTER — Encounter (HOSPITAL_COMMUNITY): Payer: Self-pay

## 2015-06-06 LAB — COMPREHENSIVE METABOLIC PANEL
ALT: 47 U/L (ref 14–54)
ALT: 40 U/L (ref 14–54)
AST: 44 U/L — ABNORMAL HIGH (ref 15–41)
AST: 38 U/L (ref 15–41)
Albumin: 2.9 g/dL — ABNORMAL LOW (ref 3.5–5.0)
Albumin: 2.5 g/dL — ABNORMAL LOW (ref 3.5–5.0)
Alkaline Phosphatase: 204 U/L — ABNORMAL HIGH (ref 38–126)
Alkaline Phosphatase: 164 U/L — ABNORMAL HIGH (ref 38–126)
Anion gap: 9 (ref 5–15)
Anion gap: 11 (ref 5–15)
BUN: 10 mg/dL (ref 6–20)
BUN: 12 mg/dL (ref 6–20)
CO2: 23 mmol/L (ref 22–32)
CO2: 23 mmol/L (ref 22–32)
Calcium: 8.2 mg/dL — ABNORMAL LOW (ref 8.9–10.3)
Calcium: 7.8 mg/dL — ABNORMAL LOW (ref 8.9–10.3)
Chloride: 102 mmol/L (ref 101–111)
Chloride: 100 mmol/L — ABNORMAL LOW (ref 101–111)
Creatinine, Ser: 0.92 mg/dL (ref 0.44–1.00)
Creatinine, Ser: 0.91 mg/dL (ref 0.44–1.00)
GFR calc Af Amer: >60 mL/min (ref >60)
GFR calc Af Amer: >60 mL/min (ref >60)
GFR calc non Af Amer: >60 mL/min (ref >60)
GFR calc non Af Amer: >60 mL/min (ref >60)
Glucose, Bld: 134 mg/dL — ABNORMAL HIGH (ref 65–99)
Glucose, Bld: 194 mg/dL — ABNORMAL HIGH (ref 65–99)
Potassium: 4.3 mmol/L (ref 3.5–5.1)
Potassium: 3.9 mmol/L (ref 3.5–5.1)
Sodium: 134 mmol/L — ABNORMAL LOW (ref 135–145)
Sodium: 134 mmol/L — ABNORMAL LOW (ref 135–145)
Total Bilirubin: 1 mg/dL (ref 0.3–1.2)
Total Bilirubin: 1 mg/dL (ref 0.3–1.2)
Total Protein: 6.2 g/dL — ABNORMAL LOW (ref 6.5–8.1)
Total Protein: 4.9 g/dL — ABNORMAL LOW (ref 6.5–8.1)

## 2015-06-06 LAB — CBC WITH DIFFERENTIAL/PLATELET
Basophils Absolute: 0 10*3/uL (ref 0.0–0.1)
Basophils Absolute: 0 10*3/uL (ref 0.0–0.1)
Basophils Relative: 0 %
Basophils Relative: 0 %
Eosinophils Absolute: 0 10*3/uL (ref 0.0–0.7)
Eosinophils Absolute: 0 10*3/uL (ref 0.0–0.7)
Eosinophils Relative: 0 %
Eosinophils Relative: 0 %
HCT: 33.4 % — ABNORMAL LOW (ref 36.0–46.0)
HCT: 30.6 % — ABNORMAL LOW (ref 36.0–46.0)
Hemoglobin: 11.4 g/dL — ABNORMAL LOW (ref 12.0–15.0)
Hemoglobin: 10.3 g/dL — ABNORMAL LOW (ref 12.0–15.0)
Lymphocytes Relative: 7 %
Lymphocytes Relative: 5 %
Lymphs Abs: 1.1 10*3/uL (ref 0.7–4.0)
Lymphs Abs: 1 10*3/uL (ref 0.7–4.0)
MCH: 25.7 pg — ABNORMAL LOW (ref 26.0–34.0)
MCH: 25.2 pg — ABNORMAL LOW (ref 26.0–34.0)
MCHC: 34.1 g/dL (ref 30.0–36.0)
MCHC: 33.7 g/dL (ref 30.0–36.0)
MCV: 75.4 fL — ABNORMAL LOW (ref 78.0–100.0)
MCV: 75 fL — ABNORMAL LOW (ref 78.0–100.0)
Monocytes Absolute: 0.6 10*3/uL (ref 0.1–1.0)
Monocytes Absolute: 0.7 10*3/uL (ref 0.1–1.0)
Monocytes Relative: 3 %
Monocytes Relative: 3 %
Neutro Abs: 15.7 10*3/uL — ABNORMAL HIGH (ref 1.7–7.7)
Neutro Abs: 20.4 10*3/uL — ABNORMAL HIGH (ref 1.7–7.7)
Neutrophils Relative %: 90 %
Neutrophils Relative %: 92 %
Platelets: 87 10*3/uL — ABNORMAL LOW (ref 150–400)
Platelets: 97 10*3/uL — ABNORMAL LOW (ref 150–400)
RBC: 4.43 MIL/uL (ref 3.87–5.11)
RBC: 4.08 MIL/uL (ref 3.87–5.11)
RDW: 15.8 % — ABNORMAL HIGH (ref 11.5–15.5)
RDW: 15.8 % — ABNORMAL HIGH (ref 11.5–15.5)
WBC: 17.5 10*3/uL — ABNORMAL HIGH (ref 4.0–10.5)
WBC: 22.2 10*3/uL — ABNORMAL HIGH (ref 4.0–10.5)

## 2015-06-06 LAB — MAGNESIUM
Magnesium: 4.9 mg/dL — ABNORMAL HIGH (ref 1.7–2.4)
Magnesium: 5.2 mg/dL — ABNORMAL HIGH (ref 1.7–2.4)

## 2015-06-06 LAB — PROTEIN / CREATININE RATIO, URINE
Creatinine, Urine: 59 mg/dL
Protein Creatinine Ratio: 2.88 mg/mg{Cre} — ABNORMAL HIGH (ref 0.00–0.15)
Total Protein, Urine: 170 mg/dL

## 2015-06-06 LAB — GROUP B STREP BY PCR: Group B strep by PCR: NEGATIVE

## 2015-06-06 LAB — URIC ACID: Uric Acid, Serum: 6.9 mg/dL — ABNORMAL HIGH (ref 2.3–6.6)

## 2015-06-06 LAB — LACTATE DEHYDROGENASE
LDH: 337 U/L — ABNORMAL HIGH (ref 98–192)
LDH: 385 U/L — ABNORMAL HIGH (ref 98–192)

## 2015-06-06 LAB — RPR: RPR Ser Ql: NONREACTIVE

## 2015-06-06 MED ORDER — FUROSEMIDE 10 MG/ML IJ SOLN
20.0000 mg | Freq: Once | INTRAMUSCULAR | Status: AC
  Administered 2015-06-06: 20 mg via INTRAVENOUS
  Filled 2015-06-06: qty 2

## 2015-06-06 MED ORDER — PRENATAL MULTIVITAMIN CH
1.0000 | ORAL_TABLET | Freq: Every day | ORAL | Status: DC
  Administered 2015-06-06 – 2015-06-10 (×5): 1 via ORAL
  Filled 2015-06-06 (×5): qty 1

## 2015-06-06 MED ORDER — ONDANSETRON HCL 4 MG PO TABS: 4.0000 mg | ORAL_TABLET | ORAL | Status: DC | PRN

## 2015-06-06 MED ORDER — LANOLIN HYDROUS EX OINT: TOPICAL_OINTMENT | CUTANEOUS | Status: DC | PRN

## 2015-06-06 MED ORDER — MAGNESIUM SULFATE 50 % IJ SOLN
2.0000 g/h | INTRAVENOUS | Status: AC
  Administered 2015-06-06: 2 g/h via INTRAVENOUS
  Filled 2015-06-06 (×2): qty 80

## 2015-06-06 MED ORDER — WITCH HAZEL-GLYCERIN EX PADS: 1.0000 "application " | MEDICATED_PAD | CUTANEOUS | Status: DC | PRN

## 2015-06-06 MED ORDER — ZOLPIDEM TARTRATE 5 MG PO TABS: 5.0000 mg | ORAL_TABLET | Freq: Every evening | ORAL | Status: DC | PRN

## 2015-06-06 MED ORDER — DIBUCAINE 1 % RE OINT
1.0000 "application " | TOPICAL_OINTMENT | RECTAL | Status: DC | PRN
  Filled 2015-06-06: qty 28

## 2015-06-06 MED ORDER — ONDANSETRON HCL 4 MG/2ML IJ SOLN: 4.0000 mg | INTRAMUSCULAR | Status: DC | PRN

## 2015-06-06 MED ORDER — IBUPROFEN 600 MG PO TABS
600.0000 mg | ORAL_TABLET | Freq: Four times a day (QID) | ORAL | Status: DC
  Administered 2015-06-06 – 2015-06-10 (×17): 600 mg via ORAL
  Filled 2015-06-06 (×17): qty 1

## 2015-06-06 MED ORDER — LABETALOL HCL 100 MG PO TABS
100.0000 mg | ORAL_TABLET | Freq: Two times a day (BID) | ORAL | Status: DC
  Administered 2015-06-06 (×2): 100 mg via ORAL
  Filled 2015-06-06 (×4): qty 1

## 2015-06-06 MED ORDER — SENNOSIDES-DOCUSATE SODIUM 8.6-50 MG PO TABS
2.0000 | ORAL_TABLET | ORAL | Status: DC
  Administered 2015-06-06 – 2015-06-10 (×3): 2 via ORAL
  Filled 2015-06-06 (×3): qty 2

## 2015-06-06 MED ORDER — DEXTROMETHORPHAN POLISTIREX ER 30 MG/5ML PO SUER
30.0000 mg | Freq: Two times a day (BID) | ORAL | Status: DC
  Administered 2015-06-06 – 2015-06-10 (×8): 30 mg via ORAL
  Filled 2015-06-06 (×13): qty 5

## 2015-06-06 MED ORDER — RHO D IMMUNE GLOBULIN 1500 UNIT/2ML IJ SOSY
300.0000 ug | PREFILLED_SYRINGE | Freq: Once | INTRAMUSCULAR | Status: AC
  Administered 2015-06-07: 300 ug via INTRAVENOUS
  Filled 2015-06-06: qty 2

## 2015-06-06 MED ORDER — TETANUS-DIPHTH-ACELL PERTUSSIS 5-2.5-18.5 LF-MCG/0.5 IM SUSP
0.5000 mL | Freq: Once | INTRAMUSCULAR | Status: DC
  Filled 2015-06-06: qty 0.5

## 2015-06-06 MED ORDER — BENZOCAINE-MENTHOL 20-0.5 % EX AERO
1.0000 "application " | INHALATION_SPRAY | CUTANEOUS | Status: DC | PRN
  Filled 2015-06-06: qty 56

## 2015-06-06 MED ORDER — DIPHENHYDRAMINE HCL 25 MG PO CAPS: 25.0000 mg | ORAL_CAPSULE | Freq: Four times a day (QID) | ORAL | Status: DC | PRN

## 2015-06-06 MED ORDER — SIMETHICONE 80 MG PO CHEW: 80.0000 mg | CHEWABLE_TABLET | ORAL | Status: DC | PRN

## 2015-06-06 MED ORDER — ACETAMINOPHEN 325 MG PO TABS: 650.0000 mg | ORAL_TABLET | ORAL | Status: DC | PRN

## 2015-06-06 NOTE — Progress Notes (Signed)
Labor Progress  Subjective: No complaints.  Denies difficulty breathing  Objective: BP 134/84 mmHg  Pulse 103  Temp(Src) 98.1 F (36.7 C) (Oral)  Resp 18  Ht  (1.6 m)  Wt 242 lb (109.77 kg)  BMI 42.88 kg/m2  SpO2 99%  LMP 09/12/2014 I/O last 3 completed shifts: In: 1054.4 [P.O.:190; I.V.:864.4] Out: 100 [Urine:100] Total I/O In: 650 [P.O.:150; I.V.:500] Out: 200 [Urine:200]   FHT: 135, moderate variability, occasional accel, occasional variable decels CTX:  regular, every 3-5 minutes Uterus gravid, soft non tender SVE:  Dilation: 3.5 Effacement (%): 90 Station: -1 Exam by:: Cadie Sorci Pitocin at 42mUn/min  Assessment:  IUP at 36.1 weeks NICHD: Category 2 Membranes:  SROM since 06/01/15 at 1045, no s/s of infection Labor progress: IUL MVUs 210-310 Pitocin Augmentation GBS: negative Amnioinfusion with bolus of 300 cc, then 150 cc/hr. IVF total 100cc/hr   Plan: Continue labor plan Continuous monitoring Rest Frequent position changes to facilitate fetal rotation and descent. Will reassess with cervical exam at 0300 or earlier if necessary Continue pitocin per protocol Peanut ball      Star Cheese, CNM, MSN 06/06/2015. 1:01 AM

## 2015-06-06 NOTE — Progress Notes (Addendum)
Subjective: Postpartum Day 0: Vaginal delivery, superficial left labial laceration--HELLP sx, RH negative, prolonged ROM at 36 weeks. Patient sitting up in bed--denies HA, visual sx, or epigastric pain.  Does feel right eye is a little swollen. Feeding:  Breastfeeding Contraceptive plan:  Undecided  Patient feeling "more swollen" now in arms and legs. Up to BR to ambulate--Foley remains in place (restarted in early pp phase).   Objective: Vital signs in last 24 hours: Temp:  [97.5 F (36.4 C)-98.4 F (36.9 C)] 97.5 F (36.4 C) (02/09 0830) Pulse Rate:  [78-116] 107 (02/09 1031) Resp:  [16-98] 20 (02/09 1031) BP: (115-195)/(71-125) 157/102 mmHg (02/09 1031) SpO2:  [96 %-100 %] 99 % (02/08 2209) Weight:  [109.77 kg (242 lb)] 109.77 kg (242 lb) (02/08 1557)   Filed Vitals:   06/06/15 0950 06/06/15 1000 06/06/15 1031 06/06/15 1108  BP:  162/86 157/102 162/87  Pulse:  103 107 101  Temp:      TempSrc:      Resp: Height:      Weight:      SpO2:        Physical Exam:  General: alert  Mild facial edema noted bilaterally. Chest clear, no adventitious sounds Lochia: appropriate Uterine Fundus: firm Perineum: healing well DVT Evaluation: Negative Homan's sign. Calf/Ankle edema is present, 2+ edema, DTR 1+, no clonus.  I/O balance:  -830 since admission Output this shift:  1250 cc  Received Lasix x 2, at 0030 and 0657  Mag at 2 gm/hr.  Marland Kitchen dextromethorphan  30 mg Oral BID  . ibuprofen  600 mg Oral 4 times per day  . prenatal multivitamin  1 tablet Oral Q1200  . [START ON 06/07/2015] senna-docusate  2 tablet Oral Q24H  . [START ON 06/07/2015] Tdap  0.5 mL Intramuscular Once   Received Hydralazine 10 mg at 2003 and 0235, last Labetalol at 1717   CBC Latest Ref Rng 06/06/2015 06/06/2015 06/05/2015  WBC 4.0 - 10.5 K/uL 22.2(H) 17.5(H) 13.9(H)  Hemoglobin 12.0 - 15.0 g/dL 10.3(L) 11.4(L) 11.2(L)  Hematocrit 36.0 - 46.0 % 30.6(L) 33.4(L) 33.2(L)  Platelets 150 - 400  K/uL 97(L) 87(L) 80(L)     Assessment/Plan: Status post vaginal delivery day 0--HELLP sx, RH negative, prolonged ROM at 36 weeks. Generalized edema Baby Rh +  Plan: Close monitoring of BP and clinical status. Lasix 20 mg IV now, due to patient perception of increased swelling SCDs Rhophylac w/u in am, with Rhophylac tomorrow.   Meridee Branum, VICKICNM 06/06/2015, 11:07 AM  Addendum: Labetalol 100 mg po BID ordered by Dr. Normand Sloop.  Filed Vitals:   06/06/15 1330 06/06/15 1400 06/06/15 1430 06/06/15 1500  BP: 155/93 148/78 155/78 152/82  Pulse: 107 100 95 102  Temp:    98.1 F (36.7 C)  TempSrc:    Oral  Resp: Height:      Weight:      SpO2:       Output since Lasix at 1207 = 500 cc Output this shift = 2800 cc  SCDs on.  Will transfer to Antenatal to complete 24 hours of magnesium. Continue close observation.  Nigel Bridgeman, CNM 06/06/15 3:36p

## 2015-06-06 NOTE — Progress Notes (Signed)
Labor Progress  Subjective: No complaints, denies difficulty breathing  Objective: BP 172/99 mmHg  Pulse 106  Temp(Src) 98.1 F (36.7 C) (Oral)  Resp 20  Ht  (1.6 m)  Wt 242 lb (109.77 kg)  BMI 42.88 kg/m2  SpO2 99%  LMP 09/12/2014 I/O last 3 completed shifts: In: 1054.4 [P.O.:190; I.V.:864.4] Out: 100 [Urine:100] Total I/O In: 735 [P.O.:150; I.V.:585] Out: 525 [Urine:525] FHT: 135 moderate variability, +accel, SP late decels x 4 down to 90's for 1 minute.  Resolved and RT baseline with position change into high fowlers. CTX:  regular, every 3-5 minutes Uterus gravid, soft non tender SVE:  Dilation: 9 Effacement (%): 90 Station: 0 Exam by:: Geoffry Bannister Pitocin at 67mUn/min  Assessment:  IUP at 36.1 weeks NICHD: Category SP 3,  Now category 2 Membranes:  SROM since 06/01/15 at 1045, no s/s of infection Labor progress: IOL MVUs 240 Pitocin Augmentation GBS: negative Amnioinfusion    Plan: Continue labor plan Continuous monitoring Frequent position changes to facilitate fetal rotation and descent. Will reassess with cervical exam at 0400 or earlier if necessary Continue pitocin per protocol DC pitocin in late decels return     Brenda Molina, CNM, MSN 06/06/2015. 2:28 AM

## 2015-06-06 NOTE — Progress Notes (Signed)
Addendum  Pt c/o SOB upon ambulating to BR.   Lung sounds diminished in the lower left lobe Consulted Dr Gunnar Bulla Give Lasix  x1 Place foley

## 2015-06-06 NOTE — Lactation Note (Signed)
This note was copied from a baby's chart. Lactation Consultation Note  Baby [redacted]w[redacted]d < 6lbs.  16 hours old. Steward Drone RN called for assistance with breastfeeding. Mother stated she tried breastfeeding but baby did not stay latched so she recently gave baby approx 10 ml of formula w/ syringe. Reviewed how to hand express and a glistening was expressed.  Also demonstrated how to prepump w/ manual pump to help evert nipples. Attempted latching baby in cross cradle and football hold.  Baby did some licks but did not latch and fell asleep. Provided mother with LPI information sheet and discussed late preterm feeding and volume guidelines. Mother asked about a nipple shield.  Demonstrated how to apply a #20NS and prefill with formula with teachback. Sonny Masters RN she may need help with this later tonight. Helped mother with DEBP.  Suggest she post pump every three hours for 10-15 min with the exception of once during the night to rest. Suggest if she pumps or expresses any colostrum she can put it in curved tip syringe and give to baby prior to formula feeding. Discussed cleaning and milk storage. Discussed feeding plan with Lajuana Ripple. Reminded mother to feed baby every 3 hours. Mom made aware of O/P services, breastfeeding support groups, community resources, and our phone # for post-discharge questions.    Patient Name: Brenda Molina ZOXWR'U Date: 06/06/2015 Reason for consult: Late preterm infant;Initial assessment   Maternal Data    Feeding Feeding Type: Formula  LATCH Score/Interventions Latch: Too sleepy or reluctant, no latch achieved, no sucking elicited. Intervention(s): Waking techniques Intervention(s): Adjust position;Assist with latch  Audible Swallowing: None Intervention(s): Hand expression  Type of Nipple: Flat  Comfort (Breast/Nipple): Soft / non-tender     Hold (Positioning): Assistance needed to correctly position infant at breast and maintain latch.  LATCH  Score: 4  Lactation Tools Discussed/Used Pump Review: Setup, frequency, and cleaning;Milk Storage   Consult Status Consult Status: Follow-up Date: 06/07/15 Follow-up type: In-patient    Dahlia Byes [facility] 06/06/2015, 8:53 PM

## 2015-06-06 NOTE — Anesthesia Postprocedure Evaluation (Deleted)
Anesthesia Post Note  Patient: Brenda Molina  Procedure(s) Performed: * No procedures listed *  Patient location during evaluation: Mother Baby Anesthesia Type: Epidural Level of consciousness: patient remains intubated per anesthesia plan, awake and awake and alert Pain management: pain level controlled Vital Signs Assessment: post-procedure vital signs reviewed and stable Cardiovascular status: blood pressure returned to baseline Postop Assessment: no headache, epidural receding, adequate PO intake, patient able to bend at knees and no signs of nausea or vomiting Anesthetic complications: no    Last Vitals:  Filed Vitals:   06/06/15 1600 06/06/15 1635  BP:  159/94  Pulse:  102  Temp:    Resp: 18 16    Last Pain:  Filed Vitals:   06/06/15 1642  PainSc: 0-No pain                 Brenda Molina, Weldon Picking

## 2015-06-07 LAB — COMPREHENSIVE METABOLIC PANEL
ALT: 44 U/L (ref 14–54)
AST: 48 U/L — ABNORMAL HIGH (ref 15–41)
Albumin: 2.7 g/dL — ABNORMAL LOW (ref 3.5–5.0)
Alkaline Phosphatase: 154 U/L — ABNORMAL HIGH (ref 38–126)
Anion gap: 8 (ref 5–15)
BUN: 14 mg/dL (ref 6–20)
CO2: 26 mmol/L (ref 22–32)
Calcium: 7.5 mg/dL — ABNORMAL LOW (ref 8.9–10.3)
Chloride: 103 mmol/L (ref 101–111)
Creatinine, Ser: 0.89 mg/dL (ref 0.44–1.00)
GFR calc Af Amer: >60 mL/min (ref >60)
GFR calc non Af Amer: >60 mL/min (ref >60)
Glucose, Bld: 118 mg/dL — ABNORMAL HIGH (ref 65–99)
Potassium: 4.2 mmol/L (ref 3.5–5.1)
Sodium: 137 mmol/L (ref 135–145)
Total Bilirubin: 0.6 mg/dL (ref 0.3–1.2)
Total Protein: 5.1 g/dL — ABNORMAL LOW (ref 6.5–8.1)

## 2015-06-07 LAB — LACTATE DEHYDROGENASE: LDH: 396 U/L — ABNORMAL HIGH (ref 98–192)

## 2015-06-07 LAB — CBC
HCT: 27.9 % — ABNORMAL LOW (ref 36.0–46.0)
Hemoglobin: 9.3 g/dL — ABNORMAL LOW (ref 12.0–15.0)
MCH: 25.3 pg — ABNORMAL LOW (ref 26.0–34.0)
MCHC: 33.3 g/dL (ref 30.0–36.0)
MCV: 76 fL — ABNORMAL LOW (ref 78.0–100.0)
Platelets: 97 10*3/uL — ABNORMAL LOW (ref 150–400)
RBC: 3.67 MIL/uL — ABNORMAL LOW (ref 3.87–5.11)
RDW: 16.4 % — ABNORMAL HIGH (ref 11.5–15.5)
WBC: 24.5 10*3/uL — ABNORMAL HIGH (ref 4.0–10.5)

## 2015-06-07 LAB — URIC ACID: Uric Acid, Serum: 6.9 mg/dL — ABNORMAL HIGH (ref 2.3–6.6)

## 2015-06-07 MED ORDER — LACTATED RINGERS IV SOLN: INTRAVENOUS | Status: DC

## 2015-06-07 MED ORDER — LABETALOL HCL 5 MG/ML IV SOLN
20.0000 mg | INTRAVENOUS | Status: AC | PRN
  Administered 2015-06-07: 20 mg via INTRAVENOUS
  Administered 2015-06-07: 40 mg via INTRAVENOUS
  Administered 2015-06-08: 20 mg via INTRAVENOUS
  Filled 2015-06-07: qty 4
  Filled 2015-06-07: qty 8
  Filled 2015-06-07: qty 4

## 2015-06-07 MED ORDER — LACTATED RINGERS IV SOLN
INTRAVENOUS | Status: DC
  Administered 2015-06-07: via INTRAVENOUS

## 2015-06-07 MED ORDER — LABETALOL HCL 100 MG PO TABS
200.0000 mg | ORAL_TABLET | Freq: Two times a day (BID) | ORAL | Status: DC
  Administered 2015-06-07: 200 mg via ORAL
  Filled 2015-06-07: qty 2

## 2015-06-07 MED ORDER — HYDRALAZINE HCL 20 MG/ML IJ SOLN
10.0000 mg | Freq: Once | INTRAMUSCULAR | Status: AC | PRN
  Administered 2015-06-07: 10 mg via INTRAVENOUS

## 2015-06-07 MED ORDER — LABETALOL HCL 5 MG/ML IV SOLN: 20.0000 mg | INTRAVENOUS | Status: DC | PRN

## 2015-06-07 MED ORDER — LABETALOL HCL 300 MG PO TABS
300.0000 mg | ORAL_TABLET | Freq: Two times a day (BID) | ORAL | Status: DC
  Administered 2015-06-07: 300 mg via ORAL
  Filled 2015-06-07: qty 1

## 2015-06-07 MED ORDER — HYDRALAZINE HCL 20 MG/ML IJ SOLN
10.0000 mg | Freq: Once | INTRAMUSCULAR | Status: DC | PRN
  Filled 2015-06-07: qty 1

## 2015-06-07 NOTE — Anesthesia Postprocedure Evaluation (Signed)
Anesthesia Post Note  Patient: Brenda Molina  Procedure(s) Performed: * No procedures listed *  Patient location during evaluation: Antenatal Anesthesia Type: Epidural Level of consciousness: awake and alert Pain management: pain level controlled Vital Signs Assessment: post-procedure vital signs reviewed and stable Respiratory status: spontaneous breathing, nonlabored ventilation and respiratory function stable Cardiovascular status: stable Postop Assessment: no headache, no backache and epidural receding Anesthetic complications: no Comments: Pt stated epidural worked well. Pain score currently 0.    Last Vitals:  Filed Vitals:   06/07/15 0536 06/07/15 0802  BP: 168/95 158/88  Pulse: 89 89  Temp:  36.6 C  Resp: 18 18    Last Pain:  Filed Vitals:   06/07/15 0807  PainSc: 0-No pain                 Corena Tilson

## 2015-06-07 NOTE — Lactation Note (Signed)
This note was copied from a baby's chart. Lactation Consultation Note  Patient Name: Girl Glennette Galster ZOXWR'U Date: 06/07/2015   Baby 34 hours old. Patient's nurse, Belenda Cruise, RN, stopped this LC from entering room because mom sleeping and baby to CN. Belenda Cruise states that mom has been assisted with nursing/pumping by CN nurses. Simonne Come to have mom call for [facility] assistance as needed when she awakens.   Maternal Data    Feeding    [facility] Score/Interventions                      Lactation Tools Discussed/Used     Consult Status      [name] 06/07/2015, 2:31 PM

## 2015-06-07 NOTE — Progress Notes (Signed)
Subjective: Postpartum Day 1: Vaginal delivery, superficial left periurethral laceration-HELLP sx, RH negative, prolonged ROM at 36 weeks. Patient up ad lib, reports no syncope or dizziness.  Denies HA, states blurry vision is getting better, no epigastric pain.  Feels better now that she is no longer on magnesium sulfate Feeding:  Breast Contraceptive plan:  Undecided-  Progesterone based contraception options discussed  Objective: Vital signs in last 24 hours: Temp:  [97.9 F (36.6 C)-98.3 F (36.8 C)] 97.9 F (36.6 C) (02/10 0802) Pulse Rate:  [89-107] 99 (02/10 1100) Resp:  [16-20] 18 (02/10 0802) BP: (139-169)/(78-99) 141/88 mmHg (02/10 1100) SpO2:  [95 %-99 %] 98 % (02/10 0802)  Physical Exam:  General: alert and cooperative Lochia: appropriate Uterine Fundus: firm Perineum: healing well DVT Evaluation: No evidence of DVT seen on physical exam. Extremeties:  Calf/ankle and pedal edema noted bilaterally, 2+pitting, Dtr+1, No clonus    CBC Latest Ref Rng 06/07/2015 06/06/2015 06/06/2015  WBC 4.0 - 10.5 K/uL 24.5(H) 22.2(H) 17.5(H)  Hemoglobin 12.0 - 15.0 g/dL 1.6(X) 10.3(L) 11.4(L)  Hematocrit 36.0 - 46.0 % 27.9(L) 30.6(L) 33.4(L)  Platelets 150 - 400 K/uL 97(L) 97(L) 87(L)     Assessment/Plan: Status post vaginal delivery day 1. S/p magnesium sulphate at 5am today Rhogam today Stable Continue current care. Breastfeeding    Beatrix Fetters 06/07/2015, 11:46 AM

## 2015-06-07 NOTE — Progress Notes (Signed)
See MAR.

## 2015-06-08 ENCOUNTER — Encounter (HOSPITAL_COMMUNITY): Payer: Self-pay | Admitting: Radiology

## 2015-06-08 ENCOUNTER — Inpatient Hospital Stay (HOSPITAL_COMMUNITY): Payer: Managed Care, Other (non HMO)

## 2015-06-08 LAB — RH IG WORKUP (INCLUDES ABO/RH)
ABO/RH(D): B NEG
Fetal Screen: NEGATIVE
Gestational Age(Wks): 36
Unit division: 0

## 2015-06-08 LAB — COMPREHENSIVE METABOLIC PANEL
ALT: 152 U/L — ABNORMAL HIGH (ref 14–54)
AST: 156 U/L — ABNORMAL HIGH (ref 15–41)
Albumin: 2.8 g/dL — ABNORMAL LOW (ref 3.5–5.0)
Alkaline Phosphatase: 149 U/L — ABNORMAL HIGH (ref 38–126)
Anion gap: 7 (ref 5–15)
BUN: 15 mg/dL (ref 6–20)
CO2: 26 mmol/L (ref 22–32)
Calcium: 8.4 mg/dL — ABNORMAL LOW (ref 8.9–10.3)
Chloride: 106 mmol/L (ref 101–111)
Creatinine, Ser: 0.84 mg/dL (ref 0.44–1.00)
GFR calc Af Amer: >60 mL/min (ref >60)
GFR calc non Af Amer: >60 mL/min (ref >60)
Glucose, Bld: 90 mg/dL (ref 65–99)
Potassium: 4 mmol/L (ref 3.5–5.1)
Sodium: 139 mmol/L (ref 135–145)
Total Bilirubin: 0.9 mg/dL (ref 0.3–1.2)
Total Protein: 5.4 g/dL — ABNORMAL LOW (ref 6.5–8.1)

## 2015-06-08 LAB — CBC
HCT: 29.5 % — ABNORMAL LOW (ref 36.0–46.0)
Hemoglobin: 9.6 g/dL — ABNORMAL LOW (ref 12.0–15.0)
MCH: 25.3 pg — ABNORMAL LOW (ref 26.0–34.0)
MCHC: 32.5 g/dL (ref 30.0–36.0)
MCV: 77.6 fL — ABNORMAL LOW (ref 78.0–100.0)
Platelets: 110 10*3/uL — ABNORMAL LOW (ref 150–400)
RBC: 3.8 MIL/uL — ABNORMAL LOW (ref 3.87–5.11)
RDW: 16.8 % — ABNORMAL HIGH (ref 11.5–15.5)
WBC: 18.5 10*3/uL — ABNORMAL HIGH (ref 4.0–10.5)

## 2015-06-08 LAB — URIC ACID: Uric Acid, Serum: 7 mg/dL — ABNORMAL HIGH (ref 2.3–6.6)

## 2015-06-08 LAB — PROTEIN / CREATININE RATIO, URINE
Creatinine, Urine: 21 mg/dL
Protein Creatinine Ratio: 1 mg/mg{Cre} — ABNORMAL HIGH (ref 0.00–0.15)
Total Protein, Urine: 21 mg/dL

## 2015-06-08 LAB — LACTATE DEHYDROGENASE: LDH: 441 U/L — ABNORMAL HIGH (ref 98–192)

## 2015-06-08 IMAGING — CR DG CHEST 1V PORT
1 series · 1 of 1 positions shown · non-contrast
Comparison: [DATE]

CLINICAL DATA: Shortness of breath.  Preeclampsia

EXAM:
PORTABLE CHEST 1 VIEW

[view not recorded]
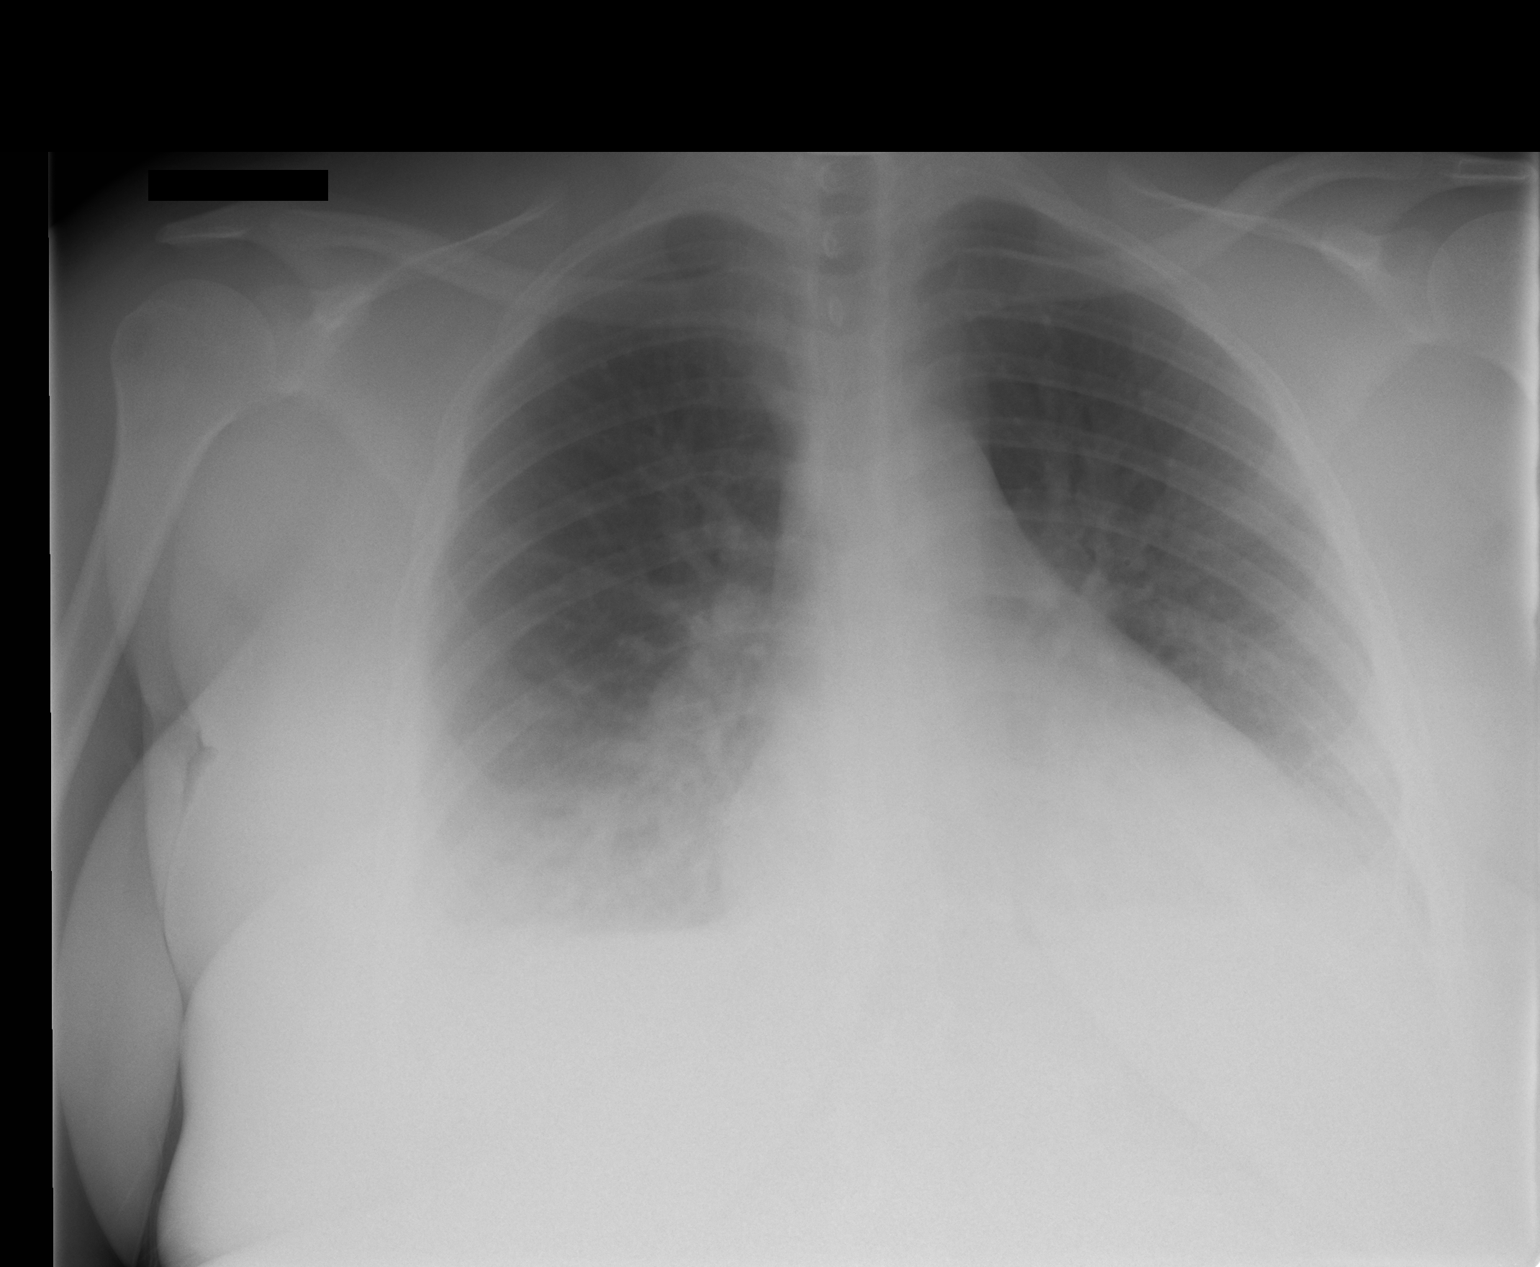

[1 of 1 positions shown; findings below may reference images not displayed]

FINDINGS: There is cardiopericardial enlargement. Negative aortic contours.
Congested appearance of the hila with trace pleural effusions.
Diffuse interstitial coarsening, greatest at the bases. No
asymmetric consolidation. No pneumothorax.
IMPRESSION: Unchanged pulmonary venous congestion with small effusions.
Increased markings at the bases could be from edema or atelectasis.

## 2015-06-08 IMAGING — CT CT ANGIO CHEST
3 of 7 series · 19 of 36 positions shown · IV contrast (OMNIPAQUE)
Comparison: Film from earlier in the same day

CLINICAL DATA: Shortness of Breath, 2 days postpartum

EXAM:
CT ANGIOGRAPHY CHEST WITH CONTRAST
TECHNIQUE: Multidetector CT imaging of the chest was performed using the
standard protocol during bolus administration of intravenous
contrast. Multiplanar CT image reconstructions and MIPs were
obtained to evaluate the vascular anatomy.
CONTRAST:  100mL OMNIPAQUE IOHEXOL 350 MG/ML SOLN

[Series 7: (person_name) thins · axial · 0.70mm/px · z∈[-281,-28]mm · 15 of 413 slices shown]
[im 26/413  lung]
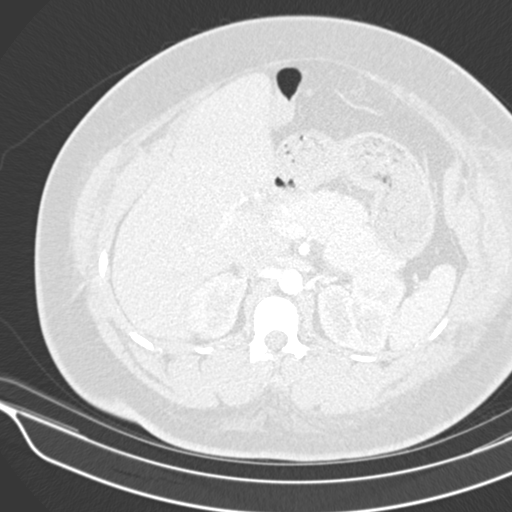
[im 52/413  mediastinal]
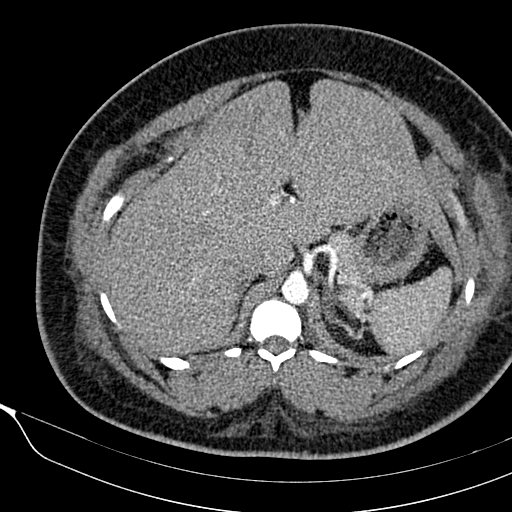
[im 78/413  lung]
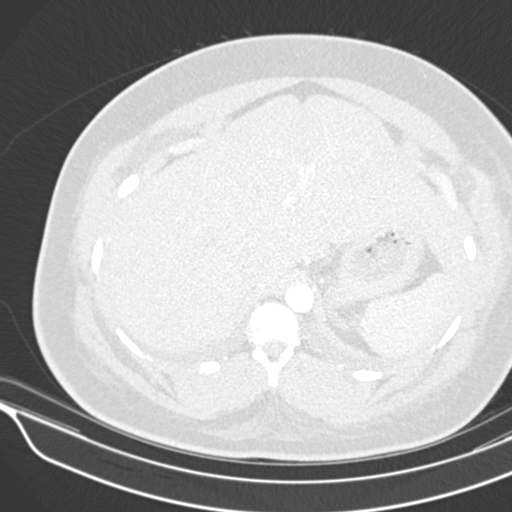
[im 104/413  mediastinal]
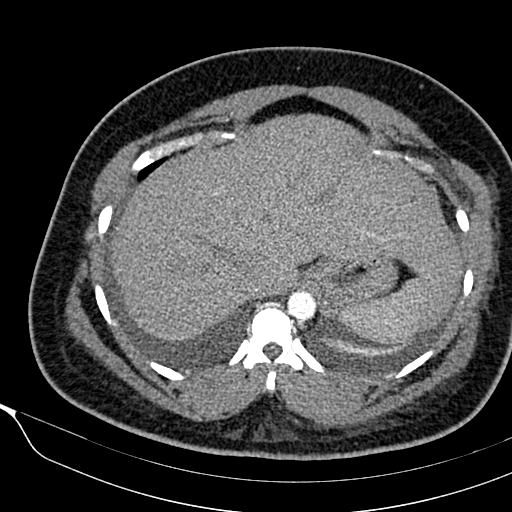
[im 129/413  lung]
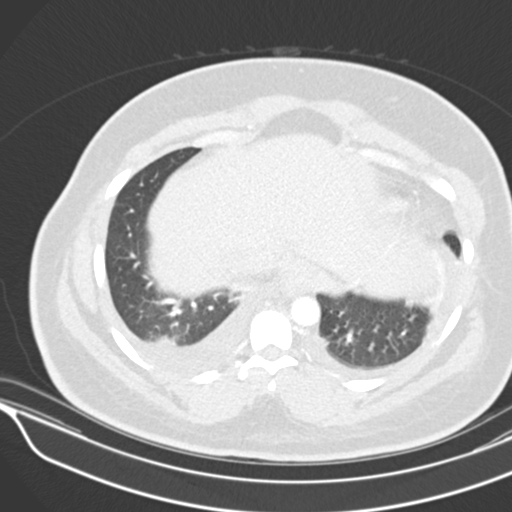
[im 155/413  mediastinal]
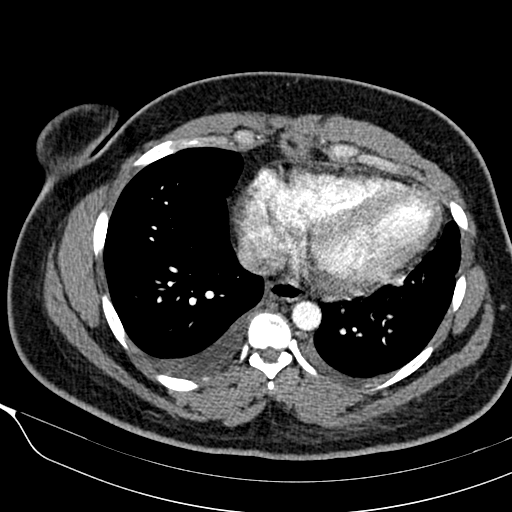
[im 181/413  lung]
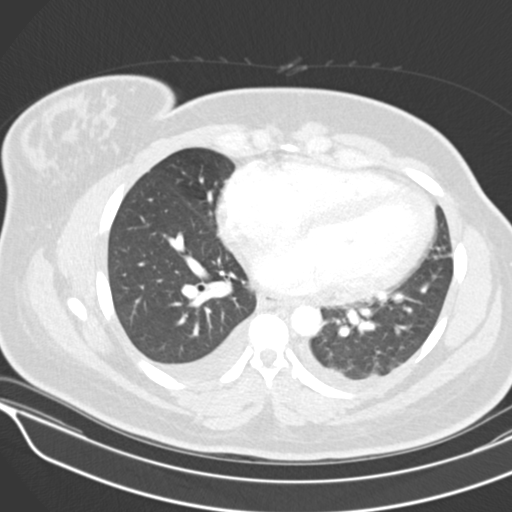
[im 207/413  mediastinal]
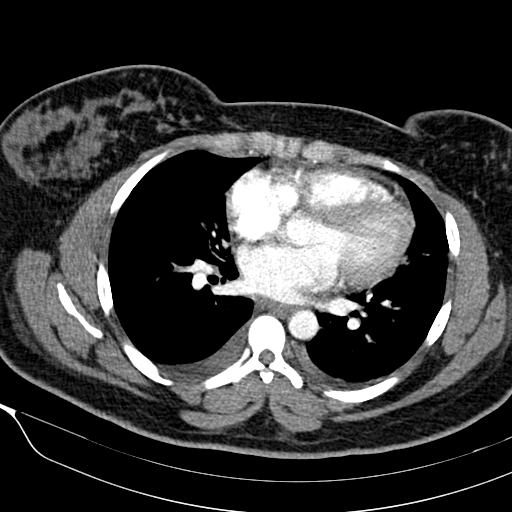
[im 232/413  lung]
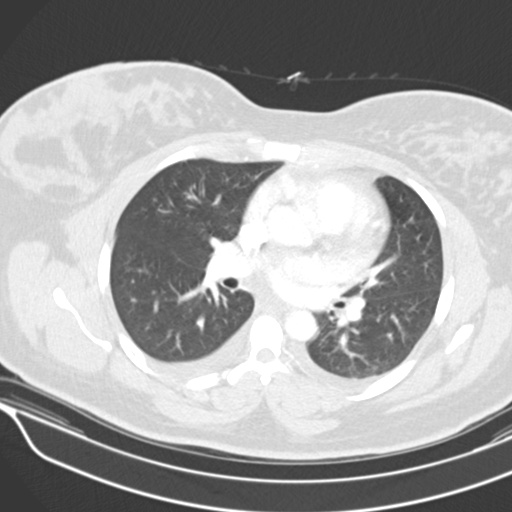
[im 258/413  mediastinal]
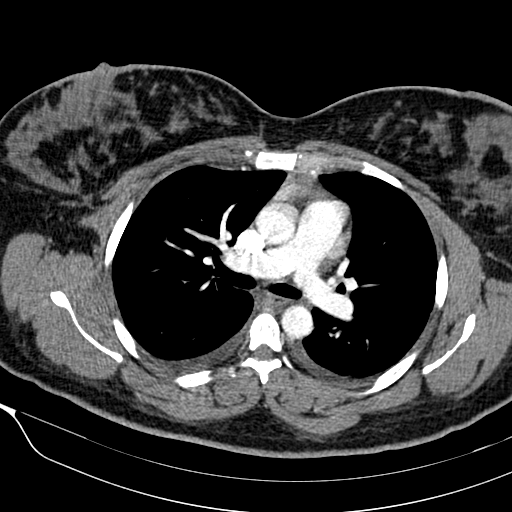
[im 284/413  lung]
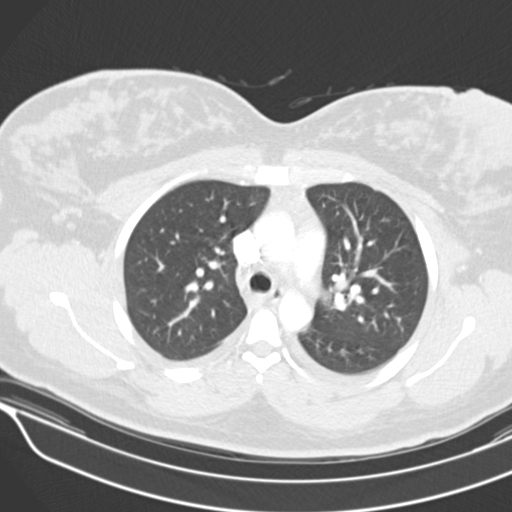
[im 310/413  mediastinal]
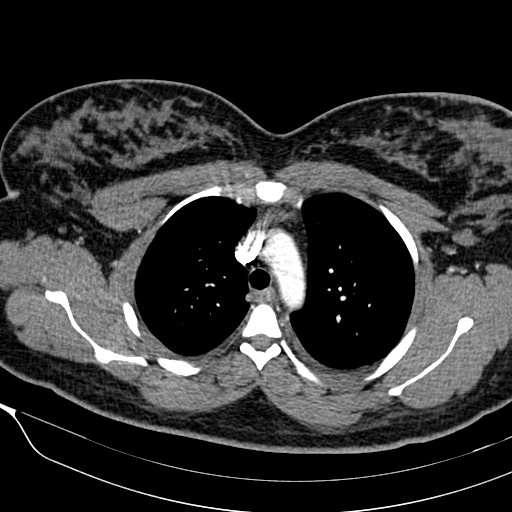
[im 335/413  lung]
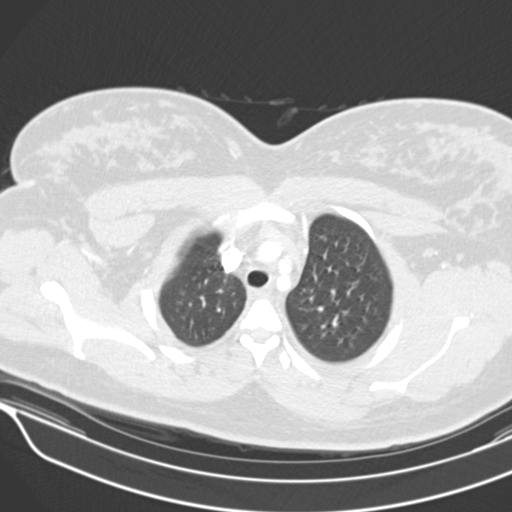
[im 361/413  mediastinal]
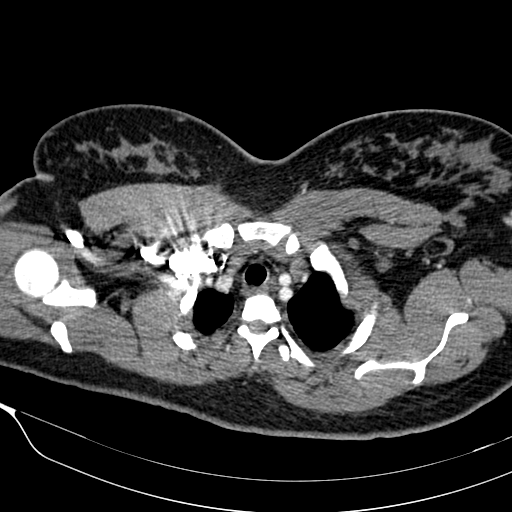
[im 387/413  lung]
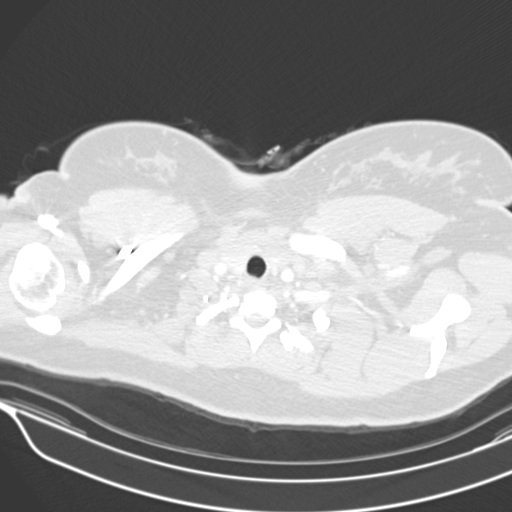

[Series 8: lung · axial · 0.70mm/px · z∈[-184,-68]mm · 3 of 116 slices shown]
[im 29/116  mediastinal]
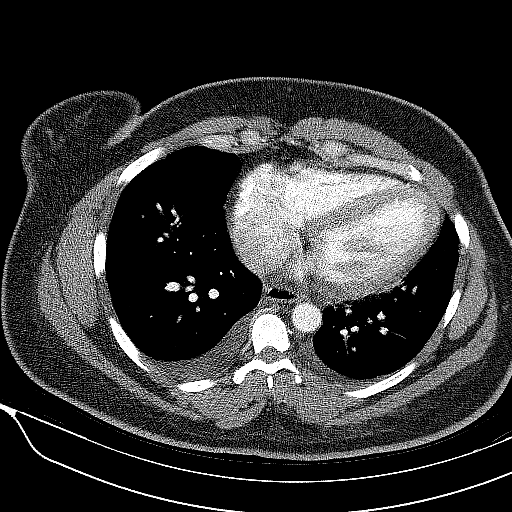
[im 58/116  mediastinal]
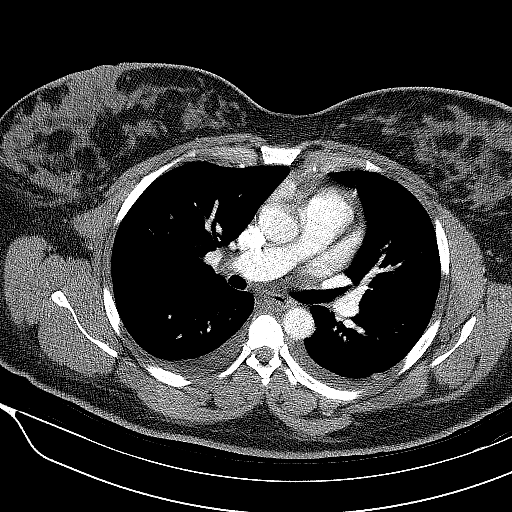
[im 87/116  mediastinal]
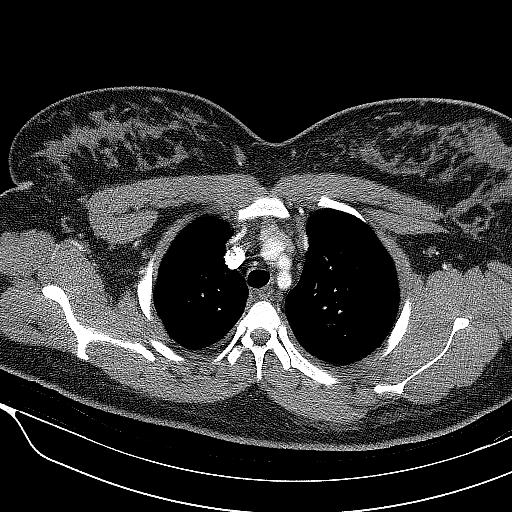

[Series 602: <mpr thick range> · coronal · 0.70mm/px · 1 of 143 slices shown]
[im 72/143  mediastinal]
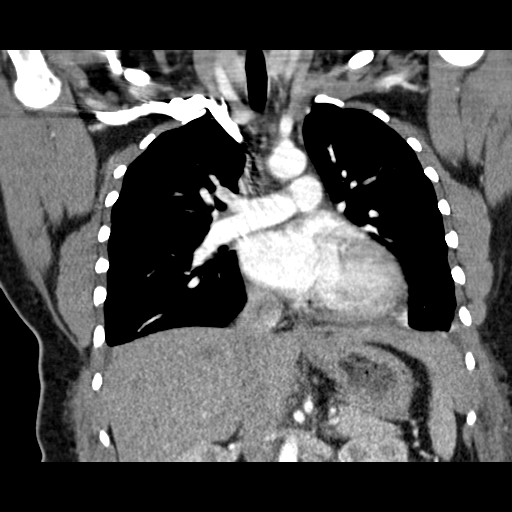

[19 of 36 positions shown; findings below may reference images not displayed]

FINDINGS: The lungs are well aerated bilaterally and demonstrate minimal
bilateral lower lobe atelectatic changes. Small bilateral pleural
effusions are seen.

Thoracic inlet is within normal limits. The thoracic aorta and its
branches are unremarkable. Pulmonary artery is well visualized and
shows no filling defects to suggest pulmonary embolus. No hilar or
mediastinal adenopathy is noted. The cardiac structures are within
normal limits.

The breasts show engorgement consistent with recent delivery. The
visualized upper abdomen is within normal limits. Bony structures
are within normal limits.

Review of the MIP images confirms the above findings.
IMPRESSION: No evidence of pulmonary emboli.

Minimal bibasilar atelectasis with small pleural effusions.

## 2015-06-08 MED ORDER — NIFEDIPINE ER OSMOTIC RELEASE 30 MG PO TB24: 60.0000 mg | ORAL_TABLET | Freq: Once | ORAL | Status: DC

## 2015-06-08 MED ORDER — HYDROCHLOROTHIAZIDE 25 MG PO TABS
25.0000 mg | ORAL_TABLET | Freq: Every day | ORAL | Status: DC
  Administered 2015-06-08 – 2015-06-10 (×3): 25 mg via ORAL
  Filled 2015-06-08 (×4): qty 1

## 2015-06-08 MED ORDER — NIFEDIPINE ER OSMOTIC RELEASE 30 MG PO TB24
60.0000 mg | ORAL_TABLET | Freq: Every day | ORAL | Status: DC
  Administered 2015-06-09 – 2015-06-10 (×2): 60 mg via ORAL
  Filled 2015-06-08 (×2): qty 2

## 2015-06-08 MED ORDER — HYDRALAZINE HCL 20 MG/ML IJ SOLN
10.0000 mg | Freq: Once | INTRAMUSCULAR | Status: AC | PRN
  Administered 2015-06-08: 10 mg via INTRAVENOUS

## 2015-06-08 MED ORDER — FUROSEMIDE 10 MG/ML IJ SOLN
20.0000 mg | Freq: Once | INTRAMUSCULAR | Status: AC
  Administered 2015-06-08: 20 mg via INTRAVENOUS
  Filled 2015-06-08: qty 2

## 2015-06-08 MED ORDER — IOHEXOL 350 MG/ML SOLN
100.0000 mL | Freq: Once | INTRAVENOUS | Status: AC | PRN
  Administered 2015-06-08: 100 mL via INTRAVENOUS

## 2015-06-08 MED ORDER — FUROSEMIDE 10 MG/ML IJ SOLN
20.0000 mg | Freq: Once | INTRAMUSCULAR | Status: AC
  Administered 2015-06-09: 20 mg via INTRAVENOUS
  Filled 2015-06-08: qty 2

## 2015-06-08 MED ORDER — NIFEDIPINE ER OSMOTIC RELEASE 30 MG PO TB24
30.0000 mg | ORAL_TABLET | ORAL | Status: AC
  Administered 2015-06-08: 30 mg via ORAL
  Filled 2015-06-08: qty 1

## 2015-06-08 MED ORDER — LABETALOL HCL 5 MG/ML IV SOLN: 20.0000 mg | INTRAVENOUS | Status: DC | PRN

## 2015-06-08 MED ORDER — HYDRALAZINE HCL 20 MG/ML IJ SOLN
10.0000 mg | Freq: Once | INTRAMUSCULAR | Status: AC | PRN
  Administered 2015-06-08: 10 mg via INTRAVENOUS
  Filled 2015-06-08: qty 1

## 2015-06-08 MED ORDER — NIFEDIPINE ER OSMOTIC RELEASE 30 MG PO TB24: 30.0000 mg | ORAL_TABLET | Freq: Every day | ORAL | Status: DC

## 2015-06-08 MED ORDER — NIFEDIPINE ER OSMOTIC RELEASE 30 MG PO TB24
30.0000 mg | ORAL_TABLET | Freq: Every day | ORAL | Status: DC
  Administered 2015-06-08: 30 mg via ORAL
  Filled 2015-06-08: qty 1

## 2015-06-08 MED ORDER — HYDRALAZINE HCL 20 MG/ML IJ SOLN
10.0000 mg | Freq: Once | INTRAMUSCULAR | Status: DC | PRN
  Administered 2015-06-08: 10 mg via INTRAVENOUS
  Filled 2015-06-08 (×3): qty 1

## 2015-06-08 MED ORDER — HYDROCHLOROTHIAZIDE 25 MG PO TABS
25.0000 mg | ORAL_TABLET | Freq: Every day | ORAL | Status: DC
  Filled 2015-06-08: qty 1

## 2015-06-08 NOTE — Progress Notes (Addendum)
Subjective: Postpartum Day 2: Vaginal delivery, superficial left periurethral laceration-HELLP sx, RH negative, prolonged ROM at 36 weeks. -Patient up ad lib, reports SOB but no syncope or dizziness. SOB when without exertion and increasingly SOB when up to bathroom  -Cough has returned. Is now also hoarse.  States she feels like her lungs are "wet". Reports she is able to breath better is she sits up in bed and has had her head of bed elevated all night "because she can breath better" .  --Denies HA, continues to C/o blurry vision, denies epigastric pain.   Feeding:  Breast  Contraceptive plan: undecided   Objective: Vital signs in last 24 hours: Temp:  [97.6 F (36.4 C)-98.5 F (36.9 C)] 98.1 F (36.7 C) (02/11 0900) Pulse Rate:  [88-106] 102 (02/11 1001) Resp:  [18-20] 20 (02/11 0417) BP: (134-179)/(75-116) 167/101 mmHg (02/11 1001) SpO2:  [98 %-100 %] 100 % (02/11 1008)  Filed Vitals:   06/08/15 0955 06/08/15 1001  BP: 168/111 167/101  Pulse: 101 102  Temp:    Resp:       Physical Exam:  General: alert and cooperative Lochia: appropriate Uterine Fundus: firm Perineum: healing well DVT Evaluation: No evidence of DVT seen on physical exam. Calf/Ankle/lower leg extremity edema is present.,   +3 pitting, DTR +1, No clonus Lungs:  Diminished bilaterally, fine crackles in lower right lobe    CBC Latest Ref Rng 06/08/2015 06/07/2015 06/06/2015  WBC 4.0 - 10.5 K/uL 18.5(H) 24.5(H) 22.2(H)  Hemoglobin 12.0 - 15.0 g/dL 1.6(X) 0.9(U) 10.3(L)  Hematocrit 36.0 - 46.0 % 29.5(L) 27.9(L) 30.6(L)  Platelets 150 - 400 K/uL 110(L) 97(L) 97(L)    Results for orders placed or performed during the hospital encounter of 06/05/15 (from the past 24 hour(s))  CBC     Status: Abnormal   Collection Time: 06/08/15  9:22 AM  Result Value Ref Range   WBC 18.5 (H) 4.0 - 10.5 K/uL   RBC 3.80 (L) 3.87 - 5.11 MIL/uL   Hemoglobin 9.6 (L) 12.0 - 15.0 g/dL   HCT 04.5 (L) 40.9 - 81.1 %   MCV 77.6 (L)  78.0 - 100.0 fL   MCH 25.3 (L) 26.0 - 34.0 pg   MCHC 32.5 30.0 - 36.0 g/dL   RDW 91.4 (H) 78.2 - 95.6 %   Platelets 110 (L) 150 - 400 K/uL  Comprehensive metabolic panel     Status: Abnormal   Collection Time: 06/08/15  9:22 AM  Result Value Ref Range   Sodium 139 135 - 145 mmol/L   Potassium 4.0 3.5 - 5.1 mmol/L   Chloride 106 101 - 111 mmol/L   CO2 26 22 - 32 mmol/L   Glucose, Bld 90 65 - 99 mg/dL   BUN 15 6 - 20 mg/dL   Creatinine, Ser 2.13 0.44 - 1.00 mg/dL   Calcium 8.4 (L) 8.9 - 10.3 mg/dL   Total Protein 5.4 (L) 6.5 - 8.1 g/dL   Albumin 2.8 (L) 3.5 - 5.0 g/dL   AST 086 (H) 15 - 41 U/L   ALT 152 (H) 14 - 54 U/L   Alkaline Phosphatase 149 (H) 38 - 126 U/L   Total Bilirubin 0.9 0.3 - 1.2 mg/dL   GFR calc non Af Amer >60 >60 mL/min   GFR calc Af Amer >60 >60 mL/min   Anion gap 7 5 - 15  Lactate dehydrogenase     Status: Abnormal   Collection Time: 06/08/15  9:22 AM  Result Value Ref Range  LDH 441 (H) 98 - 192 U/L  Uric acid     Status: Abnormal   Collection Time: 06/08/15  9:22 AM  Result Value Ref Range   Uric Acid, Serum 7.0 (H) 2.3 - 6.6 mg/dL    Assessment/Plan: Status post vaginal delivery day 2. Elevated BP -    BP parameters ordered, give hypertensive's per protocol for severe range pressures  Repeat PIH labs -  Elevated liver enzymes Procardia XL 30 mg PO daily HCTZ 25 mg PO daily X 7 days Increasingly SOB- Chest X-ray  Strict I&O Continue all management as ordered . Consult with Dr.Joeanthony Seeling    Beatrix Fetters 06/08/2015, 10:47 AM   Seen and plan of care updated as follows: Aggressive fluid management with evidence of pleural effusions and pulmonary edema Optimize BP management: Labetalol discontinued, Procardial XL increased to 60 mg daily, HCTZ 25 mg daily Repeat labs in the morning Status and plan reviewed with patient who is tearful with minimal to no support Questions answered

## 2015-06-08 NOTE — Lactation Note (Signed)
This note was copied from a baby's chart. Lactation Consultation Note  Patient Name: Brenda Molina [id number] Date: 06/08/2015 Reason for consult: Follow-up assessment;Infant < 6lbs  Baby 59 hours old. Patient's bedside nurse Gordy Councilman, RN, reports that mom's BP is elevated today and mom too weak to care for baby by herself so baby is in the CN. Mom reports that she is not getting a lot of colostrum when she pumps, and she has not felt like pumping much today. Assisted mom to hand express with no colostrum visible. Mom states that she has been diagnosed with PCOS Syndrome, so discussed the possible impact on hormone levels and breast milk supply. Discussed the normal progression of milk coming to volume. Assisted mom to use DEBP and enc hand expressing afterwards. Enc mom to pump every 2-3 hours for 8 times/24 hours. Discussed giving colostrum to baby as it is collected, including sending the colostrum container to CN as needed. Discussed with mom that baby will need to be supplemented until mom's milk comes to volume and baby able to nurse well at breast.  Mom enc to call her insurance company for DEBP and mom is aware of 2-week DEBP rental program--and has paperwork.   Maternal Data    Feeding Feeding Type: Bottle Fed - Formula Nipple Type: Slow - flow  LATCH Score/Interventions                      Lactation Tools Discussed/Used Tools: Pump Breast pump type: Double-Electric Breast Pump   Consult Status Consult Status: Follow-up Date: 06/09/15 Follow-up type: In-patient    [name] 06/08/2015, 3:19 PM

## 2015-06-08 NOTE — Progress Notes (Signed)
X-ray technician was informed that it is ok with Dr. Estanislado Pandy to have chest x-ray done in the morning.

## 2015-06-09 ENCOUNTER — Inpatient Hospital Stay (HOSPITAL_COMMUNITY): Payer: Managed Care, Other (non HMO)

## 2015-06-09 DIAGNOSIS — R0600 Dyspnea, unspecified: Secondary | ICD-10-CM

## 2015-06-09 LAB — COMPREHENSIVE METABOLIC PANEL
ALT: 503 U/L — ABNORMAL HIGH (ref 14–54)
ALT: 458 U/L — ABNORMAL HIGH (ref 14–54)
AST: 362 U/L — ABNORMAL HIGH (ref 15–41)
AST: 223 U/L — ABNORMAL HIGH (ref 15–41)
Albumin: 2.8 g/dL — ABNORMAL LOW (ref 3.5–5.0)
Albumin: 3.1 g/dL — ABNORMAL LOW (ref 3.5–5.0)
Alkaline Phosphatase: 140 U/L — ABNORMAL HIGH (ref 38–126)
Alkaline Phosphatase: 154 U/L — ABNORMAL HIGH (ref 38–126)
Anion gap: 8 (ref 5–15)
Anion gap: 9 (ref 5–15)
BUN: 15 mg/dL (ref 6–20)
BUN: 12 mg/dL (ref 6–20)
CO2: 28 mmol/L (ref 22–32)
CO2: 26 mmol/L (ref 22–32)
Calcium: 8.2 mg/dL — ABNORMAL LOW (ref 8.9–10.3)
Calcium: 8.9 mg/dL (ref 8.9–10.3)
Chloride: 101 mmol/L (ref 101–111)
Chloride: 103 mmol/L (ref 101–111)
Creatinine, Ser: 0.94 mg/dL (ref 0.44–1.00)
Creatinine, Ser: 0.8 mg/dL (ref 0.44–1.00)
GFR calc Af Amer: >60 mL/min (ref >60)
GFR calc Af Amer: >60 mL/min (ref >60)
GFR calc non Af Amer: >60 mL/min (ref >60)
GFR calc non Af Amer: >60 mL/min (ref >60)
Glucose, Bld: 96 mg/dL (ref 65–99)
Glucose, Bld: 111 mg/dL — ABNORMAL HIGH (ref 65–99)
Potassium: 3.4 mmol/L — ABNORMAL LOW (ref 3.5–5.1)
Potassium: 3.8 mmol/L (ref 3.5–5.1)
Sodium: 137 mmol/L (ref 135–145)
Sodium: 138 mmol/L (ref 135–145)
Total Bilirubin: 1 mg/dL (ref 0.3–1.2)
Total Bilirubin: 1.2 mg/dL (ref 0.3–1.2)
Total Protein: 5.5 g/dL — ABNORMAL LOW (ref 6.5–8.1)
Total Protein: 5.6 g/dL — ABNORMAL LOW (ref 6.5–8.1)

## 2015-06-09 LAB — CBC
HCT: 29.7 % — ABNORMAL LOW (ref 36.0–46.0)
HCT: 32.4 % — ABNORMAL LOW (ref 36.0–46.0)
Hemoglobin: 9.6 g/dL — ABNORMAL LOW (ref 12.0–15.0)
Hemoglobin: 10.6 g/dL — ABNORMAL LOW (ref 12.0–15.0)
MCH: 24.9 pg — ABNORMAL LOW (ref 26.0–34.0)
MCH: 25.2 pg — ABNORMAL LOW (ref 26.0–34.0)
MCHC: 32.3 g/dL (ref 30.0–36.0)
MCHC: 32.7 g/dL (ref 30.0–36.0)
MCV: 77.1 fL — ABNORMAL LOW (ref 78.0–100.0)
MCV: 77.1 fL — ABNORMAL LOW (ref 78.0–100.0)
Platelets: 138 10*3/uL — ABNORMAL LOW (ref 150–400)
Platelets: 154 10*3/uL (ref 150–400)
RBC: 3.85 MIL/uL — ABNORMAL LOW (ref 3.87–5.11)
RBC: 4.2 MIL/uL (ref 3.87–5.11)
RDW: 16.6 % — ABNORMAL HIGH (ref 11.5–15.5)
RDW: 16.9 % — ABNORMAL HIGH (ref 11.5–15.5)
WBC: 17.7 10*3/uL — ABNORMAL HIGH (ref 4.0–10.5)
WBC: 18.5 10*3/uL — ABNORMAL HIGH (ref 4.0–10.5)

## 2015-06-09 LAB — TYPE AND SCREEN
ABO/RH(D): B NEG
Antibody Screen: POSITIVE
DAT, IgG: NEGATIVE
Unit division: 0
Unit division: 0

## 2015-06-09 LAB — LACTATE DEHYDROGENASE
LDH: 535 U/L — ABNORMAL HIGH (ref 98–192)
LDH: 432 U/L — ABNORMAL HIGH (ref 98–192)

## 2015-06-09 LAB — URIC ACID: Uric Acid, Serum: 8.3 mg/dL — ABNORMAL HIGH (ref 2.3–6.6)

## 2015-06-09 IMAGING — CR DG CHEST 2V
2 series · 2 of 2 positions shown · non-contrast
Comparison: [DATE] and prior studies

CLINICAL DATA: 30-year-old female with shortness of breath.

EXAM:
CHEST  2 VIEW

[chest pa]
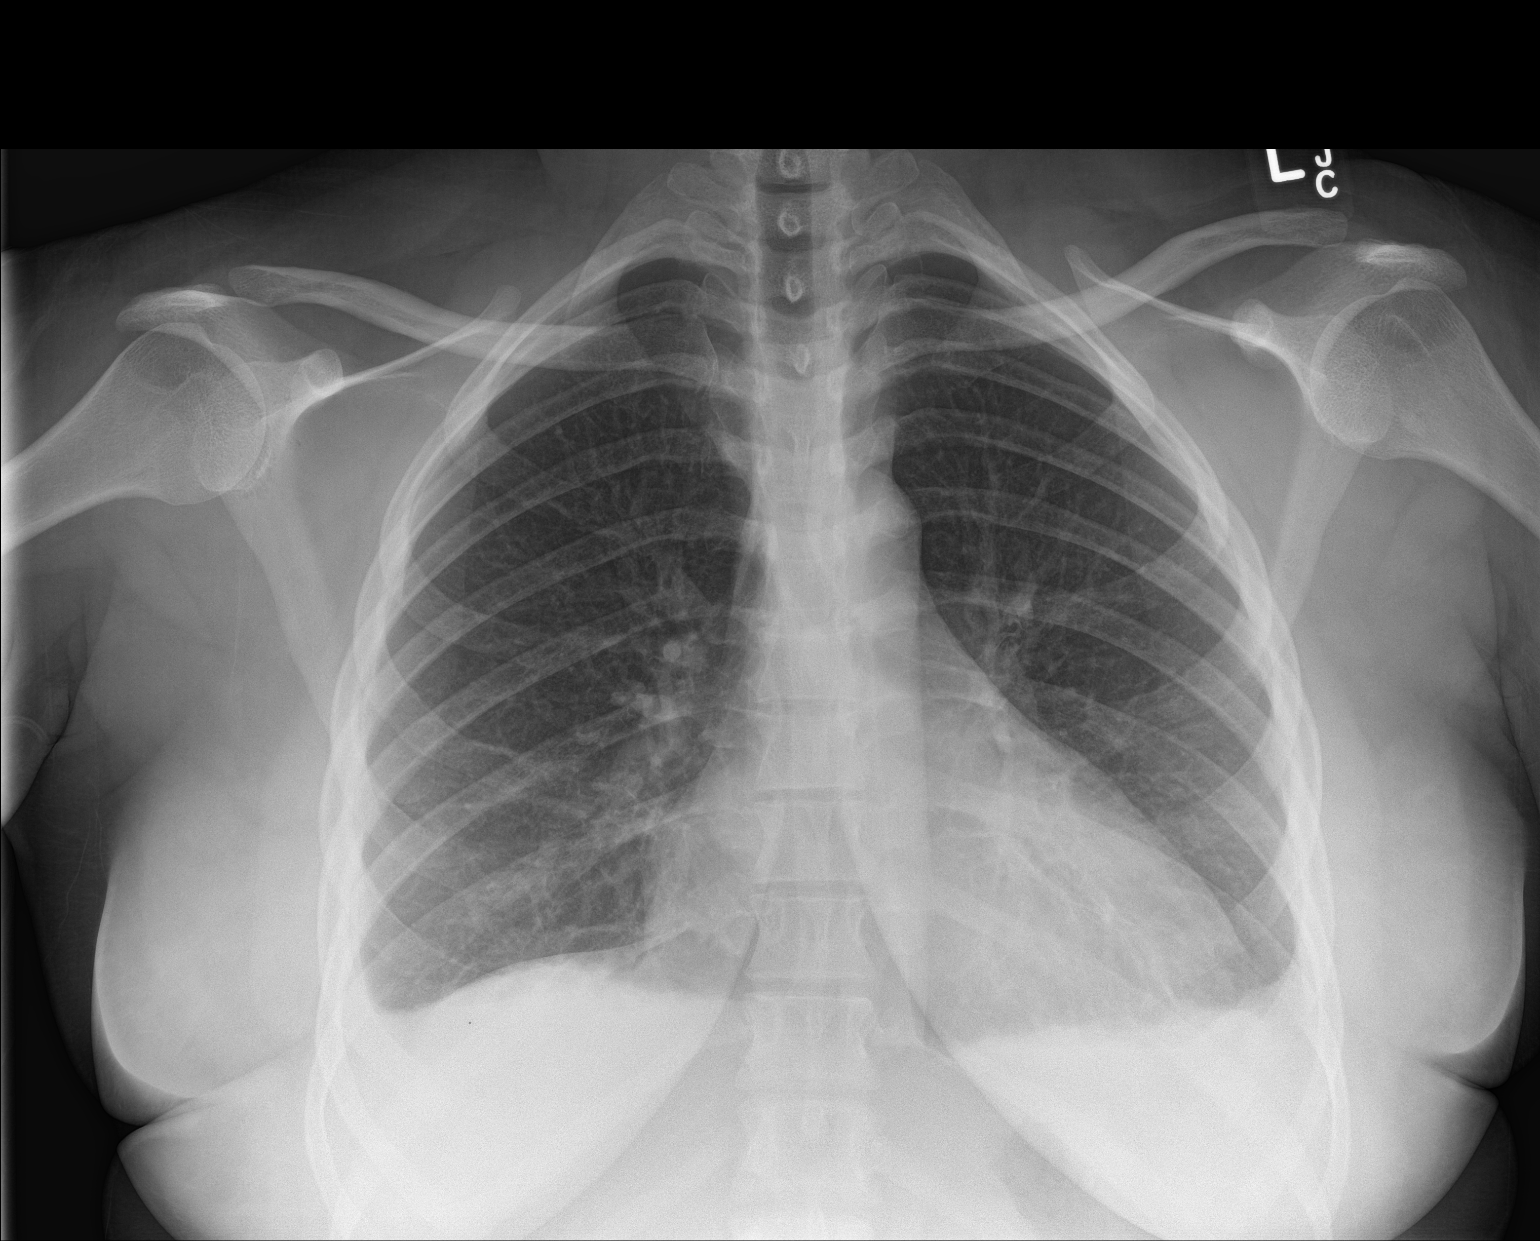

[chest lat]
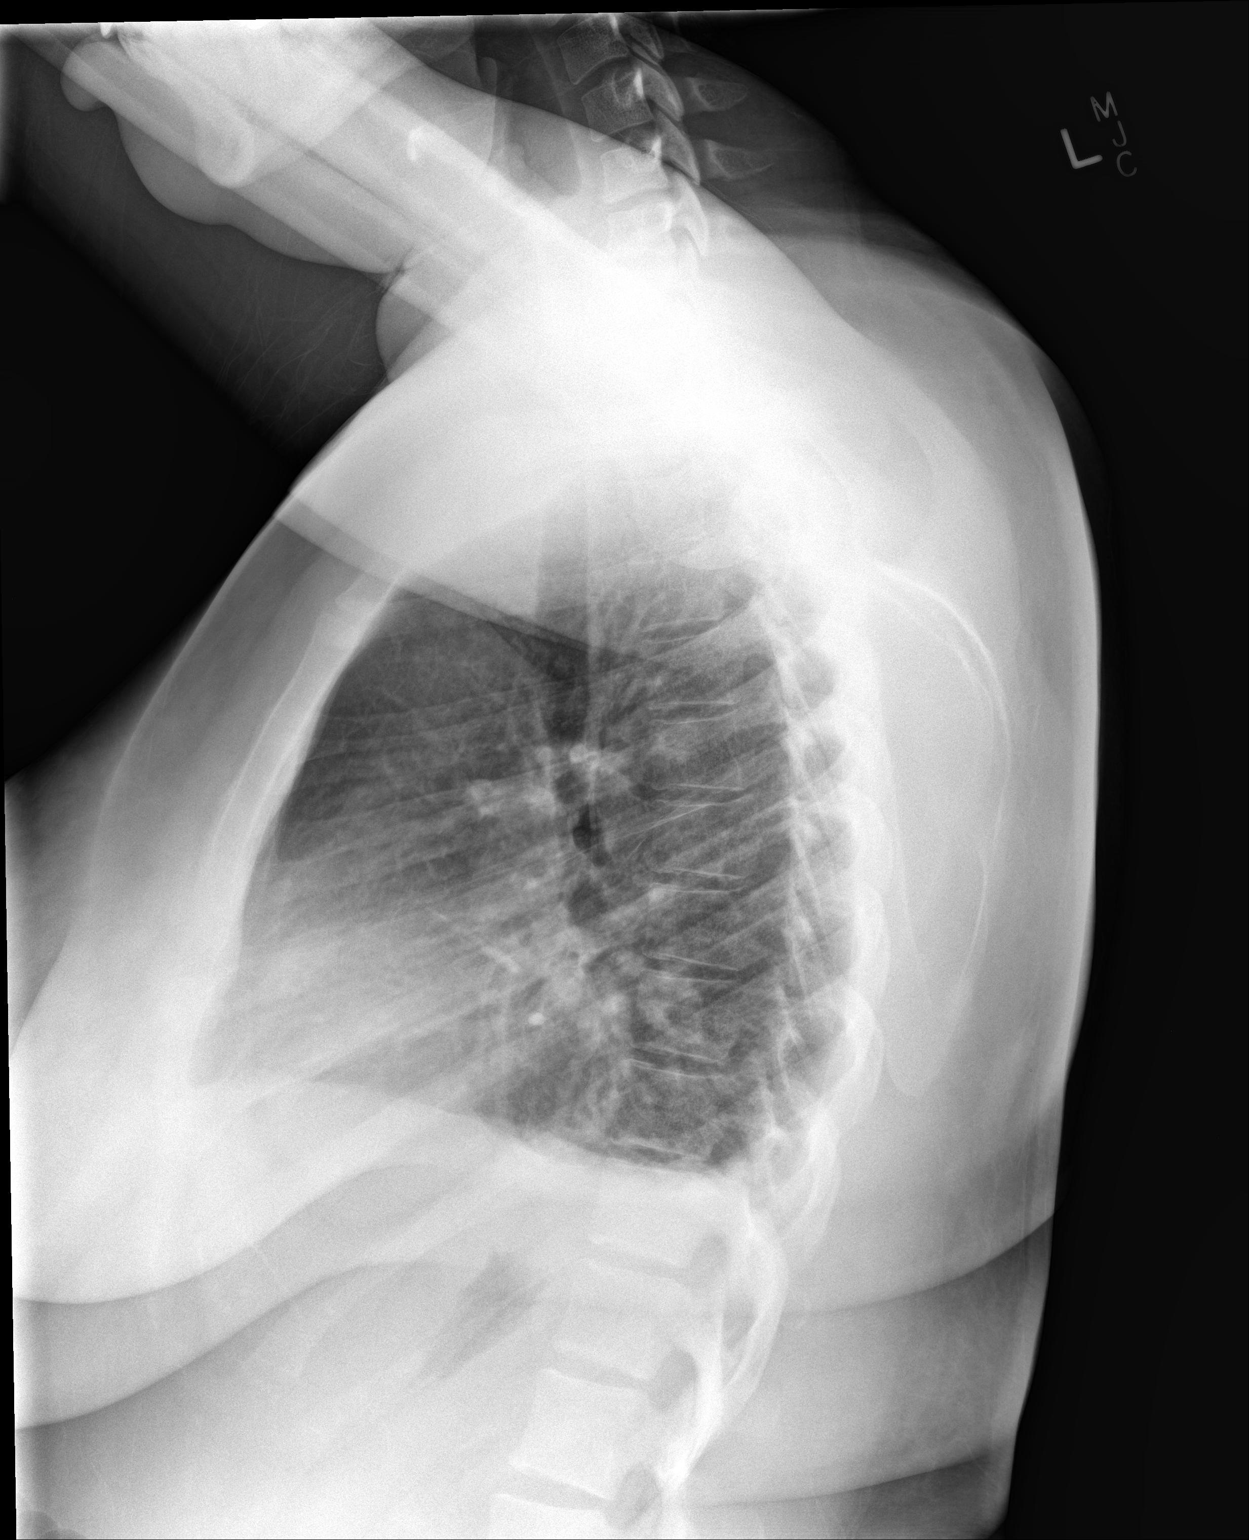

[2 of 2 positions shown; findings below may reference images not displayed]

FINDINGS: Mild cardiomegaly again noted.

Small bilateral pleural effusions noted with decreased bibasilar
atelectasis.

Pulmonary vascular congestion has decreased. There is no evidence of
pulmonary edema.

No pneumothorax or airspace disease noted.
IMPRESSION: Decreased pulmonary vascular congestion without edema.

Small bilateral pleural effusions noted with decreased bibasilar
atelectasis.

## 2015-06-09 MED ORDER — FERROUS SULFATE 325 (65 FE) MG PO TABS
325.0000 mg | ORAL_TABLET | Freq: Two times a day (BID) | ORAL | Status: DC
  Administered 2015-06-09 – 2015-06-10 (×2): 325 mg via ORAL
  Filled 2015-06-09 (×3): qty 1

## 2015-06-09 NOTE — Progress Notes (Signed)
Subjective: Postpartum Day 2 : Vaginal delivery, superficial left periurethral laceration-HELLP sx, RH negative, prolonged ROM at 36 weeks Patient up ad lib, reports no syncope or dizziness. Continue to complain of shortness of breath when she is out of bed but states, "it is better than yesterday Feeding:  Breast Contraceptive plan:  Micronor to begin with , may decide on Mirena   Objective: Vital signs in last 24 hours: Temp:  [98 F (36.7 C)-98.5 F (36.9 C)] 98.1 F (36.7 C) (02/12 0810) Pulse Rate:  [84-133] 111 (02/12 1231) Resp:  [16-18] 16 (02/12 0810) BP: (116-171)/(74-102) 146/91 mmHg (02/12 1231) SpO2:  [97 %-100 %] 100 % (02/12 0810)  Physical Exam:  General: alert and cooperative Lochia: appropriate Uterine Fundus: firm Perineum: healing well DVT Evaluation: No evidence of DVT seen on physical exam. Negative Homan's sign. Thigh/Calf/Ankle edema is present., =2 pitting pedal edema, No clonus, DTRs +1 Lungs diminished but clear,no couch today  CBC Latest Ref Rng 06/09/2015 06/08/2015 06/07/2015  WBC 4.0 - 10.5 K/uL 17.7(H) 18.5(H) 24.5(H)  Hemoglobin 12.0 - 15.0 g/dL 1.6(X) 0.9(U) 0.4(V)  Hematocrit 36.0 - 46.0 % 29.7(L) 29.5(L) 27.9(L)  Platelets 150 - 400 K/uL 138(L) 110(L) 97(L)     Results for orders placed or performed during the hospital encounter of 06/05/15 (from the past 24 hour(s))  CBC     Status: Abnormal   Collection Time: 06/09/15  5:23 AM  Result Value Ref Range   WBC 17.7 (H) 4.0 - 10.5 K/uL   RBC 3.85 (L) 3.87 - 5.11 MIL/uL   Hemoglobin 9.6 (L) 12.0 - 15.0 g/dL   HCT 40.9 (L) 81.1 - 91.4 %   MCV 77.1 (L) 78.0 - 100.0 fL   MCH 24.9 (L) 26.0 - 34.0 pg   MCHC 32.3 30.0 - 36.0 g/dL   RDW 78.2 (H) 95.6 - 21.3 %   Platelets 138 (L) 150 - 400 K/uL  Comprehensive metabolic panel     Status: Abnormal   Collection Time: 06/09/15  5:23 AM  Result Value Ref Range   Sodium 137 135 - 145 mmol/L   Potassium 3.4 (L) 3.5 - 5.1 mmol/L   Chloride 101 101 -  111 mmol/L   CO2 28 22 - 32 mmol/L   Glucose, Bld 96 65 - 99 mg/dL   BUN 15 6 - 20 mg/dL   Creatinine, Ser 0.86 0.44 - 1.00 mg/dL   Calcium 8.2 (L) 8.9 - 10.3 mg/dL   Total Protein 5.5 (L) 6.5 - 8.1 g/dL   Albumin 2.8 (L) 3.5 - 5.0 g/dL   AST 578 (H) 15 - 41 U/L   ALT 503 (H) 14 - 54 U/L   Alkaline Phosphatase 140 (H) 38 - 126 U/L   Total Bilirubin 1.0 0.3 - 1.2 mg/dL   GFR calc non Af Amer >60 >60 mL/min   GFR calc Af Amer >60 >60 mL/min   Anion gap 8 5 - 15  Lactate dehydrogenase     Status: Abnormal   Collection Time: 06/09/15  5:23 AM  Result Value Ref Range   LDH 535 (H) 98 - 192 U/L       Assessment/Plan: Status post vaginal delivery day 2 . Stable Elevated BP - BP parameters ordered, give hypertensive's per protocol for severe range pressures  Repeat PIH labs - Elevated liver enzymes Procardia XL 60 mg PO daily HCTZ 25 mg PO daily X 7 days Chest X-ray today  - Mild Cardio megaly noted, small bilaterla pleural effusions noted with  decreased bibasilar atelactasis Decreased pulmonary vascular congestion without edema  Echo today  Strict I&O Continue current care. Breastfeeding    Brenda Molina 06/09/2015, 12:58 PM

## 2015-06-09 NOTE — Lactation Note (Addendum)
This note was copied from a baby's chart. Lactation Consultation Note  Patient Name: Brenda Molina [id number] Date: 06/09/2015 Reason for consult: Follow-up assessment;Infant < 6lbs;Late preterm infant  Mom continues to elevated BP, and elevated liver enzymes. Assisted with latch at the breast.  Baby showing cues, hadn't been fed in 5 hrs.  Mom with large, heavy breasts, support recommended for deep areolar grasp, lifts tongue up and slips to suckle on nipple tip.  Initiated a 20 mm nipple shield, and baby still latched onto nipple.  Baby needed stimulation to continue to suck.  Initiated use of feeding tube behind nipple shield, with instruction to Mom.  Used expressed breast milk from an hour prior (20 ml).  Baby fed at breast primarily on nipple.  Encouraged firm breast support during feeding.  Baby fed 20 ml of Alimentum by slow flow bottle follow breast feeding.  Set Mom up to double pump 20 minutes.    Mom aware of plan- BF using nipple shield (15-20 mins) Supplement with EBM+/formula 30-40 ml as tolerated Pump both breasts 15-20 min (Massage and manual expression)  To call for help prn Consult Status Consult Status: Follow-up Date: 06/10/15 Follow-up type: In-patient    Brenda Molina 06/09/2015, 10:48 AM

## 2015-06-09 NOTE — Progress Notes (Signed)
  Echocardiogram 2D Echocardiogram has been performed.  Arvil Chaco 06/09/2015, 12:17 PM

## 2015-06-09 NOTE — Progress Notes (Signed)
Pt without complaints.  Mild blurred vision.  No RUQ pain, no leg tendrness  BP 146/91 mmHg  Pulse 111  Temp(Src) 98.1 F (36.7 C) (Oral)  Resp 16  Ht 5' 3"  (1.6 m)  Wt 242 lb (109.77 kg)  BMI 42.88 kg/m2  SpO2 100%  LMP 09/12/2014  Breastfeeding? Unknown  FHTS na  Toco none  Pt in NAD CV RRR Lungs CTAB abd  Gravid soft and NT GU no vb EXt no calf tenderness Results for orders placed or performed during the hospital encounter of 06/05/15 (from the past 72 hour(s))  Rh IG workup (includes ABO/Rh)     Status: None   Collection Time: 06/07/15  5:14 AM  Result Value Ref Range   Gestational Age(Wks) 36    ABO/RH(D) B NEG    Fetal Screen NEG    Unit Number [id number]    Blood Component Type RHIG    Unit division 00    Status of Unit ISSUED,FINAL    Transfusion Status OK TO TRANSFUSE   CBC     Status: Abnormal   Collection Time: 06/07/15  5:14 AM  Result Value Ref Range   WBC 24.5 (H) 4.0 - 10.5 K/uL   RBC 3.67 (L) 3.87 - 5.11 MIL/uL   Hemoglobin 9.3 (L) 12.0 - 15.0 g/dL   HCT 27.9 (L) 36.0 - 46.0 %   MCV 76.0 (L) 78.0 - 100.0 fL   MCH 25.3 (L) 26.0 - 34.0 pg   MCHC 33.3 30.0 - 36.0 g/dL   RDW 16.4 (H) 11.5 - 15.5 %   Platelets 97 (L) 150 - 400 K/uL    Comment: REPEATED TO VERIFY SPECIMEN CHECKED FOR CLOTS CONSISTENT WITH PREVIOUS RESULT   Comprehensive metabolic panel     Status: Abnormal   Collection Time: 06/07/15  5:14 AM  Result Value Ref Range   Sodium 137 135 - 145 mmol/L   Potassium 4.2 3.5 - 5.1 mmol/L   Chloride 103 101 - 111 mmol/L   CO2 26 22 - 32 mmol/L   Glucose, Bld 118 (H) 65 - 99 mg/dL   BUN 14 6 - 20 mg/dL   Creatinine, Ser 0.89 0.44 - 1.00 mg/dL   Calcium 7.5 (L) 8.9 - 10.3 mg/dL   Total Protein 5.1 (L) 6.5 - 8.1 g/dL   Albumin 2.7 (L) 3.5 - 5.0 g/dL   AST 48 (H) 15 - 41 U/L   ALT 44 14 - 54 U/L   Alkaline Phosphatase 154 (H) 38 - 126 U/L   Total Bilirubin 0.6 0.3 - 1.2 mg/dL   GFR calc non Af Amer >60 >60 mL/min   GFR calc Af Amer  >60 >60 mL/min    Comment: (NOTE) The eGFR has been calculated using the CKD EPI equation. This calculation has not been validated in all clinical situations. eGFR's persistently <60 mL/min signify possible Chronic Kidney Disease.    Anion gap 8 5 - 15  Lactate dehydrogenase     Status: Abnormal   Collection Time: 06/07/15  5:14 AM  Result Value Ref Range   LDH 396 (H) 98 - 192 U/L  Uric acid     Status: Abnormal   Collection Time: 06/07/15  5:14 AM  Result Value Ref Range   Uric Acid, Serum 6.9 (H) 2.3 - 6.6 mg/dL  CBC     Status: Abnormal   Collection Time: 06/08/15  9:22 AM  Result Value Ref Range   WBC 18.5 (H) 4.0 - 10.5 K/uL   RBC  3.80 (L) 3.87 - 5.11 MIL/uL   Hemoglobin 9.6 (L) 12.0 - 15.0 g/dL   HCT 29.5 (L) 36.0 - 46.0 %   MCV 77.6 (L) 78.0 - 100.0 fL   MCH 25.3 (L) 26.0 - 34.0 pg   MCHC 32.5 30.0 - 36.0 g/dL   RDW 16.8 (H) 11.5 - 15.5 %   Platelets 110 (L) 150 - 400 K/uL    Comment: REPEATED TO VERIFY SPECIMEN CHECKED FOR CLOTS PLATELET COUNT CONFIRMED BY SMEAR SHISTOCYTES NOTED ON SMEAR   Comprehensive metabolic panel     Status: Abnormal   Collection Time: 06/08/15  9:22 AM  Result Value Ref Range   Sodium 139 135 - 145 mmol/L   Potassium 4.0 3.5 - 5.1 mmol/L   Chloride 106 101 - 111 mmol/L   CO2 26 22 - 32 mmol/L   Glucose, Bld 90 65 - 99 mg/dL   BUN 15 6 - 20 mg/dL   Creatinine, Ser 0.84 0.44 - 1.00 mg/dL   Calcium 8.4 (L) 8.9 - 10.3 mg/dL   Total Protein 5.4 (L) 6.5 - 8.1 g/dL   Albumin 2.8 (L) 3.5 - 5.0 g/dL   AST 156 (H) 15 - 41 U/L   ALT 152 (H) 14 - 54 U/L   Alkaline Phosphatase 149 (H) 38 - 126 U/L   Total Bilirubin 0.9 0.3 - 1.2 mg/dL   GFR calc non Af Amer >60 >60 mL/min   GFR calc Af Amer >60 >60 mL/min    Comment: (NOTE) The eGFR has been calculated using the CKD EPI equation. This calculation has not been validated in all clinical situations. eGFR's persistently <60 mL/min signify possible Chronic Kidney Disease.    Anion gap 7 5 - 15   Lactate dehydrogenase     Status: Abnormal   Collection Time: 06/08/15  9:22 AM  Result Value Ref Range   LDH 441 (H) 98 - 192 U/L  Uric acid     Status: Abnormal   Collection Time: 06/08/15  9:22 AM  Result Value Ref Range   Uric Acid, Serum 7.0 (H) 2.3 - 6.6 mg/dL  Protein / creatinine ratio, urine     Status: Abnormal   Collection Time: 06/08/15 11:30 AM  Result Value Ref Range   Creatinine, Urine 21.00 mg/dL   Total Protein, Urine 21 mg/dL    Comment: NO NORMAL RANGE ESTABLISHED FOR THIS TEST   Protein Creatinine Ratio 1.00 (H) 0.00 - 0.15 mg/mg[Cre]  CBC     Status: Abnormal   Collection Time: 06/09/15  5:23 AM  Result Value Ref Range   WBC 17.7 (H) 4.0 - 10.5 K/uL   RBC 3.85 (L) 3.87 - 5.11 MIL/uL   Hemoglobin 9.6 (L) 12.0 - 15.0 g/dL   HCT 29.7 (L) 36.0 - 46.0 %   MCV 77.1 (L) 78.0 - 100.0 fL   MCH 24.9 (L) 26.0 - 34.0 pg   MCHC 32.3 30.0 - 36.0 g/dL   RDW 16.6 (H) 11.5 - 15.5 %   Platelets 138 (L) 150 - 400 K/uL  Comprehensive metabolic panel     Status: Abnormal   Collection Time: 06/09/15  5:23 AM  Result Value Ref Range   Sodium 137 135 - 145 mmol/L   Potassium 3.4 (L) 3.5 - 5.1 mmol/L   Chloride 101 101 - 111 mmol/L   CO2 28 22 - 32 mmol/L   Glucose, Bld 96 65 - 99 mg/dL   BUN 15 6 - 20 mg/dL   Creatinine, Ser 0.94  0.44 - 1.00 mg/dL   Calcium 8.2 (L) 8.9 - 10.3 mg/dL   Total Protein 5.5 (L) 6.5 - 8.1 g/dL   Albumin 2.8 (L) 3.5 - 5.0 g/dL   AST 362 (H) 15 - 41 U/L   ALT 503 (H) 14 - 54 U/L   Alkaline Phosphatase 140 (H) 38 - 126 U/L   Total Bilirubin 1.0 0.3 - 1.2 mg/dL   GFR calc non Af Amer >60 >60 mL/min   GFR calc Af Amer >60 >60 mL/min    Comment: (NOTE) The eGFR has been calculated using the CKD EPI equation. This calculation has not been validated in all clinical situations. eGFR's persistently <60 mL/min signify possible Chronic Kidney Disease.    Anion gap 8 5 - 15  Lactate dehydrogenase     Status: Abnormal   Collection Time: 06/09/15  5:23  AM  Result Value Ref Range   LDH 535 (H) 98 - 192 U/L    Assessment and Plan  PPD 3 S/P SVD Preclampsia with severe features Pt with pulmonary edema now appears resolved after lasix.  Continue HCTZ and procardia Spiral CT neg LFTS are rising.  i spoke with Dr Leonides Sake who feels this is probably a contiuum of the disease process.  Will recheck labs this evening and in the morning Echo prelem neg awaiting official report

## 2015-06-10 MED ORDER — IBUPROFEN 600 MG PO TABS: 600.0000 mg | ORAL_TABLET | Freq: Four times a day (QID) | ORAL | Status: DC

## 2015-06-10 MED ORDER — NIFEDIPINE ER 60 MG PO TB24: 60.0000 mg | ORAL_TABLET | Freq: Every day | ORAL | Status: DC

## 2015-06-10 MED ORDER — FERROUS SULFATE 325 (65 FE) MG PO TABS: 325.0000 mg | ORAL_TABLET | Freq: Two times a day (BID) | ORAL | Status: DC

## 2015-06-10 NOTE — Lactation Note (Signed)
This note was copied from a baby's chart. Lactation Consultation Note  Patient Name: Brenda Molina [id number] Date: 06/10/2015 Reason for consult: Follow-up assessment;Infant < 40lbs  Baby 64 days old. Mom reports that she has had assistance with latching the baby from the CN, and she feels comfortable with nursing/feeding baby. Mom aware of OP/BFSG and LC phone line assistance. Mom given 2-week DEBP rental. Mom has been pumping and states that she is getting over an ounce with each pumping session. Enc mom to put baby to breast each time she cues to feed, then supplement with EBM/formula, and then post pump followed by hand expression.  Maternal Data    Feeding Feeding Type: Breast Fed Length of feed: 10 min  LATCH Score/Interventions                      Lactation Tools Discussed/Used     Consult Status Consult Status: PRN    [name] 06/10/2015, 2:57 PM

## 2015-06-10 NOTE — Discharge Summary (Signed)
OB Discharge Summary  Patient Name: Brenda Molina DOB: 05/05/1985 MRN: [id number]  Date of admission: 06/05/2015 Delivering MD: Adelina Mings   Date of discharge: 06/10/2015  Admitting diagnosis: 36 WKS, SROM Intrauterine pregnancy: [redacted]w[redacted]d     Secondary diagnosis:Principal Problem:   Normal vaginal delivery Active Problems:   RhD negative   HELLP syndrome in third trimester   Severe preeclampsia   Preterm premature rupture of membranes  Additional problems:PTD,      Discharge diagnosis: Term Pregnancy Delivered and CHTN with superimposed preeclampsia                                                                     Post partum procedures:rhogam  Augmentation: AROM, Pitocin and Cytotec  Complications: None  Hospital course:  Induction of Labor With Vaginal Delivery   30 y.o. yo G1P0101 at [redacted]w[redacted]d was admitted to the hospital 06/05/2015 for induction of labor.  Indication for induction: Preeclampsia.  Patient had an uncomplicated labor course as follows: Membrane Rupture Time/Date: 10:45 AM ,06/01/2015   Intrapartum Procedures: Episiotomy: None [1]                                         Lacerations:  Periurethral [8]  Patient had delivery of a Viable infant.  Information for the patient's newborn:  Ngoc, Detjen [[id number]]  Delivery Method: Vaginal, Spontaneous Delivery (Filed from Delivery Summary)   06/06/2015  Details of delivery can be found in separate delivery note.  Patient had a routine postpartum course. Patient is discharged home 06/10/2015.   Physical exam  Filed Vitals:   06/10/15 0921 06/10/15 0923 06/10/15 1220 06/10/15 1221  BP: 141/97  152/97   Pulse: 89 94 99 103  Temp: 98.2 F (36.8 C)  98.4 F (36.9 C)   TempSrc: Oral  Oral   Resp: 18  18   Height:      Weight:      SpO2:  99% 99%    General: alert, cooperative and no distress Lochia: appropriate Uterine Fundus: firm Incision: Healing well with no significant drainage DVT  Evaluation: No evidence of DVT seen on physical exam. Labs: Lab Results  Component Value Date   WBC 18.5* 06/09/2015   HGB 10.6* 06/09/2015   HCT 32.4* 06/09/2015   MCV 77.1* 06/09/2015   PLT 154 06/09/2015   CMP Latest Ref Rng 06/09/2015  Glucose 65 - 99 mg/dL 086(V)  BUN 6 - 20 mg/dL 12  Creatinine 7.84 - 6.96 mg/dL 2.95  Sodium 284 - 132 mmol/L 138  Potassium 3.5 - 5.1 mmol/L 3.8  Chloride 101 - 111 mmol/L 103  CO2 22 - 32 mmol/L 26  Calcium 8.9 - 10.3 mg/dL 8.9  Total Protein 6.5 - 8.1 g/dL 4.4(W)  Total Bilirubin 0.3 - 1.2 mg/dL 1.2  Alkaline Phos 38 - 126 U/L 154(H)  AST 15 - 41 U/L 223(H)  ALT 14 - 54 U/L 458(H)    Discharge instruction: per After Visit Summary and "Baby and Me Booklet".  Medications:  Current facility-administered medications:  .  acetaminophen (TYLENOL) tablet 650 mg, 650 mg, Oral, Q4H PRN, Estle Sabella,  CNM .  benzocaine-Menthol (DERMOPLAST) 20-0.5 % topical spray 1 application, 1 application, Topical, PRN, Lorri Fukuhara, CNM .  dextromethorphan (DELSYM) 30 MG/5ML liquid 30 mg, 30 mg, Oral, BID, Nigel Bridgeman, CNM, 30 mg at 06/10/15 8119 .  witch hazel-glycerin (TUCKS) pad 1 application, 1 application, Topical, PRN **AND** dibucaine (NUPERCAINAL) 1 % rectal ointment 1 application, 1 application, Rectal, PRN, Antonious Omahoney, CNM .  diphenhydrAMINE (BENADRYL) capsule 25 mg, 25 mg, Oral, Q6H PRN, Shawnda Mauney, CNM .  ferrous sulfate tablet 325 mg, 325 mg, Oral, BID WC, Alphonzo Severance, CNM, 325 mg at 06/10/15 1478 .  hydrALAZINE (APRESOLINE) injection 10 mg, 10 mg, Intravenous, Once PRN, Sherre Scarlet, CNM, 10 mg at 06/08/15 2956 .  hydrochlorothiazide (HYDRODIURIL) tablet 25 mg, 25 mg, Oral, Daily, Alphonzo Severance, CNM, 25 mg at 06/10/15 0926 .  ibuprofen (ADVIL,MOTRIN) tablet 600 mg, 600 mg, Oral, 4 times per day, Zyonna Vardaman, CNM, 600 mg at 06/10/15 1224 .  labetalol (NORMODYNE,TRANDATE) injection 20-80 mg, 20-80 mg, Intravenous, Q10 min PRN,  Alphonzo Severance, CNM .  lactated ringers infusion, , Intravenous, Continuous, Daylene Vandenbosch, CNM, Stopped at 06/07/15 0538 .  lanolin ointment, , Topical, PRN, Shardae Kleinman, CNM .  NIFEdipine (PROCARDIA-XL/ADALAT-CC/NIFEDICAL-XL) 24 hr tablet 60 mg, 60 mg, Oral, Q breakfast, Silverio Lay, MD, 60 mg at 06/10/15 0925 .  ondansetron (ZOFRAN) tablet 4 mg, 4 mg, Oral, Q4H PRN **OR** ondansetron (ZOFRAN) injection 4 mg, 4 mg, Intravenous, Q4H PRN, Pallie Swigert, CNM .  prenatal multivitamin tablet 1 tablet, 1 tablet, Oral, Q1200, Kanae Ignatowski, CNM, 1 tablet at 06/10/15 1223 .  senna-docusate (Senokot-S) tablet 2 tablet, 2 tablet, Oral, Q24H, Chalonda Schlatter, CNM, 2 tablet at 06/10/15 0003 .  simethicone (MYLICON) chewable tablet 80 mg, 80 mg, Oral, PRN, Shemuel Harkleroad, CNM .  Tdap (BOOSTRIX) injection 0.5 mL, 0.5 mL, Intramuscular, Once, Melvin Whiteford, CNM, 0.5 mL at 06/07/15 1025 .  zolpidem (AMBIEN) tablet 5 mg, 5 mg, Oral, QHS PRN, Keyera Hattabaugh, CNM After Visit Meds:    Medication List    STOP taking these medications        calcium carbonate 500 MG chewable tablet  Commonly known as:  TUMS - dosed in mg elemental calcium     cetirizine 10 MG tablet  Commonly known as:  ZYRTEC     famotidine 10 MG chewable tablet  Commonly known as:  PEPCID AC     guaiFENesin 100 MG/5ML liquid  Commonly known as:  ROBITUSSIN      TAKE these medications        ferrous sulfate 325 (65 FE) MG tablet  Take 1 tablet (325 mg total) by mouth 2 (two) times daily with a meal.     ibuprofen 600 MG tablet  Commonly known as:  ADVIL,MOTRIN  Take 1 tablet (600 mg total) by mouth every 6 (six) hours.     NIFEdipine 60 MG 24 hr tablet  Commonly known as:  PROCARDIA-XL/ADALAT CC  Take 1 tablet (60 mg total) by mouth daily with breakfast.     prenatal multivitamin Tabs tablet  Take 1 tablet by mouth daily at 12 noon.      ASK your doctor about these medications        clonazePAM 1 MG tablet    Commonly known as:  KLONOPIN  Take 1 tablet (1 mg total) by mouth 2 (two) times daily.     drospirenone-ethinyl estradiol 3-0.02 MG tablet  Commonly known as:  YAZ,GIANVI,LORYNA  Take 1 tablet by mouth  daily.        Diet: routine diet  Activity: Advance as tolerated. Pelvic rest for 6 weeks.   Outpatient follow up:6 weeks, 2 week, Thursday for B check Follow up Appt:No future appointments. Follow up visit: No Follow-up on file.  Postpartum contraception: Undecided  Newborn Data: Live born female  Birth Weight: 5 lb 5 oz (2410 g) APGAR: 7, 9 4 Baby Feeding: Bottle and Breast Disposition:home with mother   06/10/2015 Renn Stille, CNM      Postpartum Care After Vaginal Delivery  After you deliver your newborn (postpartum period), the usual stay in the hospital is 24 72 hours. If there were problems with your labor or delivery, or if you have other medical problems, you might be in the hospital longer.  While you are in the hospital, you will receive help and instructions on how to care for yourself and your newborn during the postpartum period.  While you are in the hospital:  Be sure to tell your nurses if you have pain or discomfort, as well as where you feel the pain and what makes the pain worse.  If you had an incision made near your vagina (episiotomy) or if you had some tearing during delivery, the nurses may put ice packs on your episiotomy or tear. The ice packs may help to reduce the pain and swelling.  If you are breastfeeding, you may feel uncomfortable contractions of your uterus for a couple of weeks. This is normal. The contractions help your uterus get back to normal size.  It is normal to have some bleeding after delivery.  For the first 1 3 days after delivery, the flow is red and the amount may be similar to a period.  It is common for the flow to start and stop.  In the first few days, you may pass some small clots. Let your nurses know if  you begin to pass large clots or your flow increases.  Do not  flush blood clots down the toilet before having the nurse look at them.  During the next 3 10 days after delivery, your flow should become more watery and pink or brown-tinged in color.  Ten to fourteen days after delivery, your flow should be a small amount of yellowish-white discharge.  The amount of your flow will decrease over the first few weeks after delivery. Your flow may stop in 6 8 weeks. Most women have had their flow stop by 12 weeks after delivery.  You should change your sanitary pads frequently.  Wash your hands thoroughly with soap and water for at least 20 seconds after changing pads, using the toilet, or before holding or feeding your newborn.  You should feel like you need to empty your bladder within the first 6 8 hours after delivery.  In case you become weak, lightheaded, or faint, call your nurse before you get out of bed for the first time and before you take a shower for the first time.  Within the first few days after delivery, your breasts may begin to feel tender and full. This is called engorgement. Breast tenderness usually goes away within 48 72 hours after engorgement occurs. You may also notice milk leaking from your breasts. If you are not breastfeeding, do not stimulate your breasts. Breast stimulation can make your breasts produce more milk.  Spending as much time as possible with your newborn is very important. During this time, you and your newborn can feel close and get to know  each other. Having your newborn stay in your room (rooming in) will help to strengthen the bond with your newborn. It will give you time to get to know your newborn and become comfortable caring for your newborn.  Your hormones change after delivery. Sometimes the hormone changes can temporarily cause you to feel sad or tearful. These feelings should not last more than a few days. If these feelings last longer than  that, you should talk to your caregiver.  If desired, talk to your caregiver about methods of family planning or contraception.  Talk to your caregiver about immunizations. Your caregiver may want you to have the following immunizations before leaving the hospital:  Tetanus, diphtheria, and pertussis (Tdap) or tetanus and diphtheria (Td) immunization. It is very important that you and your family (including grandparents) or others caring for your newborn are up-to-date with the Tdap or Td immunizations. The Tdap or Td immunization can help protect your newborn from getting ill.  Rubella immunization.  Varicella (chickenpox) immunization.  Influenza immunization. You should receive this annual immunization if you did not receive the immunization during your pregnancy. Document Released: 02/08/2007 Document Revised: 01/06/2012 Document Reviewed: 12/09/2011 [facility] Patient Information 2014 [place], [place].   Postpartum Depression and Baby Blues  The postpartum period begins right after the birth of a baby. During this time, there is often a great amount of joy and excitement. It is also a time of considerable changes in the life of the parent(s). Regardless of how many times a mother gives birth, each child brings new challenges and dynamics to the family. It is not unusual to have feelings of excitement accompanied by confusing shifts in moods, emotions, and thoughts. All mothers are at risk of developing postpartum depression or the "baby blues." These mood changes can occur right after giving birth, or they may occur many months after giving birth. The baby blues or postpartum depression can be mild or severe. Additionally, postpartum depression can resolve rather quickly, or it can be a long-term condition. CAUSES Elevated hormones and their rapid decline are thought to be a main cause of postpartum depression and the baby blues. There are a number of hormones that radically change during and  after pregnancy. Estrogen and progesterone usually decrease immediately after delivering your baby. The level of thyroid hormone and various cortisol steroids also rapidly drop. Other factors that play a major role in these changes include major life events and genetics.  RISK FACTORS If you have any of the following risks for the baby blues or postpartum depression, know what symptoms to watch out for during the postpartum period. Risk factors that may increase the likelihood of getting the baby blues or postpartum depression include: 1. Havinga personal or family history of depression. 2. Having depression while being pregnant. 3. Having premenstrual or oral contraceptive-associated mood issues. 4. Having exceptional life stress. 5. Having marital conflict. 6. Lacking a social support network. 7. Having a baby with special needs. 8. Having health problems such as diabetes. SYMPTOMS Baby blues symptoms include:  Brief fluctuations in mood, such as going from extreme happiness to sadness.  Decreased concentration.  Difficulty sleeping.  Crying spells, tearfulness.  Irritability.  Anxiety. Postpartum depression symptoms typically begin within the first month after giving birth. These symptoms include:  Difficulty sleeping or excessive sleepiness.  Marked weight loss.  Agitation.  Feelings of worthlessness.  Lack of interest in activity or food. Postpartum psychosis is a very concerning condition and can be dangerous. Fortunately, it is  rare. Displaying any of the following symptoms is cause for immediate medical attention. Postpartum psychosis symptoms include:  Hallucinations and delusions.  Bizarre or disorganized behavior.  Confusion or disorientation. DIAGNOSIS  A diagnosis is made by an evaluation of your symptoms. There are no medical or lab tests that lead to a diagnosis, but there are various questionnaires that a caregiver may use to identify those with the baby  blues, postpartum depression, or psychosis. Often times, a screening tool called the [place] Postnatal Depression Scale is used to diagnose depression in the postpartum period.  TREATMENT The baby blues usually goes away on its own in 1 to 2 weeks. Social support is often all that is needed. You should be encouraged to get adequate sleep and rest. Occasionally, you may be given medicines to help you sleep.  Postpartum depression requires treatment as it can last several months or longer if it is not treated. Treatment may include individual or group therapy, medicine, or both to address any social, physiological, and psychological factors that may play a role in the depression. Regular exercise, a healthy diet, rest, and social support may also be strongly recommended.  Postpartum psychosis is more serious and needs treatment right away. Hospitalization is often needed. HOME CARE INSTRUCTIONS  Get as much rest as you can. Nap when the baby sleeps.  Exercise regularly. Some women find yoga and walking to be beneficial.  Eat a balanced and nourishing diet.  Do little things that you enjoy. Have a cup of tea, take a bubble bath, read your favorite magazine, or listen to your favorite music.  Avoid alcohol.  Ask for help with household chores, cooking, grocery shopping, or running errands as needed. Do not try to do everything.  Talk to people close to you about how you are feeling. Get support from your partner, family members, friends, or other new moms.  Try to stay positive in how you think. Think about the things you are grateful for.  Do not spend a lot of time alone.  Only take medicine as directed by your caregiver.  Keep all your postpartum appointments.  Let your caregiver know if you have any concerns. SEEK MEDICAL CARE IF: You are having a reaction or problems with your medicine. SEEK IMMEDIATE MEDICAL CARE IF:  You have suicidal feelings.  You feel you may harm the baby  or someone else. Document Released: 01/16/2004 Document Revised: 07/06/2011 Document Reviewed: 02/17/2011 [facility] Patient Information 2014 [place], [place].     Breastfeeding Deciding to breastfeed is one of the best choices you can make for you and your baby. A change in hormones during pregnancy causes your breast tissue to grow and increases the number and size of your milk ducts. These hormones also allow proteins, sugars, and fats from your blood supply to make breast milk in your milk-producing glands. Hormones prevent breast milk from being released before your baby is born as well as prompt milk flow after birth. Once breastfeeding has begun, thoughts of your baby, as well as his or her sucking or crying, can stimulate the release of milk from your milk-producing glands.  BENEFITS OF BREASTFEEDING For Your Baby  Your first milk (colostrum) helps your baby's digestive system function better.   There are antibodies in your milk that help your baby fight off infections.   Your baby has a lower incidence of asthma, allergies, and sudden infant death syndrome.   The nutrients in breast milk are better for your baby than infant formulas  and are designed uniquely for your baby's needs.   Breast milk improves your baby's brain development.   Your baby is less likely to develop other conditions, such as childhood obesity, asthma, or type 2 diabetes mellitus.  For You   Breastfeeding helps to create a very special bond between you and your baby.   Breastfeeding is convenient. Breast milk is always available at the correct temperature and costs nothing.   Breastfeeding helps to burn calories and helps you lose the weight gained during pregnancy.   Breastfeeding makes your uterus contract to its prepregnancy size faster and slows bleeding (lochia) after you give birth.   Breastfeeding helps to lower your risk of developing type 2 diabetes mellitus, osteoporosis, and breast or  ovarian cancer later in life. SIGNS THAT YOUR BABY IS HUNGRY Early Signs of Hunger  Increased alertness or activity.  Stretching.  Movement of the head from side to side.  Movement of the head and opening of the mouth when the corner of the mouth or cheek is stroked (rooting).  Increased sucking sounds, smacking lips, cooing, sighing, or squeaking.  Hand-to-mouth movements.  Increased sucking of fingers or hands. Late Signs of Hunger  Fussing.  Intermittent crying. Extreme Signs of Hunger Signs of extreme hunger will require calming and consoling before your baby will be able to breastfeed successfully. Do not wait for the following signs of extreme hunger to occur before you initiate breastfeeding:   Restlessness.  A loud, strong cry.   Screaming.   BREASTFEEDING BASICS Breastfeeding Initiation  Find a comfortable place to sit or lie down, with your neck and back well supported.  Place a pillow or rolled up blanket under your baby to bring him or her to the level of your breast (if you are seated). Nursing pillows are specially designed to help support your arms and your baby while you breastfeed.  Make sure that your baby's abdomen is facing your abdomen.   Gently massage your breast. With your fingertips, massage from your chest wall toward your nipple in a circular motion. This encourages milk flow. You may need to continue this action during the feeding if your milk flows slowly.  Support your breast with 4 fingers underneath and your thumb above your nipple. Make sure your fingers are well away from your nipple and your baby's mouth.   Stroke your baby's lips gently with your finger or nipple.   When your baby's mouth is open wide enough, quickly bring your baby to your breast, placing your entire nipple and as much of the colored area around your nipple (areola) as possible into your baby's mouth.   More areola should be visible above your baby's upper  lip than below the lower lip.   Your baby's tongue should be between his or her lower gum and your breast.   Ensure that your baby's mouth is correctly positioned around your nipple (latched). Your baby's lips should create a seal on your breast and be turned out (everted).  It is common for your baby to suck about 2-3 minutes in order to start the flow of breast milk. Latching Teaching your baby how to latch on to your breast properly is very important. An improper latch can cause nipple pain and decreased milk supply for you and poor weight gain in your baby. Also, if your baby is not latched onto your nipple properly, he or she may swallow some air during feeding. This can make your baby fussy. Burping your baby  when you switch breasts during the feeding can help to get rid of the air. However, teaching your baby to latch on properly is still the best way to prevent fussiness from swallowing air while breastfeeding. Signs that your baby has successfully latched on to your nipple:    Silent tugging or silent sucking, without causing you pain.   Swallowing heard between every 3-4 sucks.    Muscle movement above and in front of his or her ears while sucking.  Signs that your baby has not successfully latched on to nipple:   Sucking sounds or smacking sounds from your baby while breastfeeding.  Nipple pain. If you think your baby has not latched on correctly, slip your finger into the corner of your baby's mouth to break the suction and place it between your baby's gums. Attempt breastfeeding initiation again. Signs of Successful Breastfeeding Signs from your baby:   A gradual decrease in the number of sucks or complete cessation of sucking.   Falling asleep.   Relaxation of his or her body.   Retention of a small amount of milk in his or her mouth.   Letting go of your breast by himself or herself. Signs from you:  Breasts that have increased in firmness, weight, and  size 1-3 hours after feeding.   Breasts that are softer immediately after breastfeeding.  Increased milk volume, as well as a change in milk consistency and color by the fifth day of breastfeeding.   Nipples that are not sore, cracked, or bleeding. Signs That Your Pecola Leisure is Getting Enough Milk  Wetting at least 3 diapers in a 24-hour period. The urine should be clear and pale yellow by age 185 days.  At least 3 stools in a 24-hour period by age 185 days. The stool should be soft and yellow.  At least 3 stools in a 24-hour period by age 33 days. The stool should be seedy and yellow.  No loss of weight greater than 10% of birth weight during the first 96 days of age.  Average weight gain of 4-7 ounces (113-198 g) per week after age 18 days.  Consistent daily weight gain by age 185 days, without weight loss after the age of 2 weeks. After a feeding, your baby may spit up a small amount. This is common. BREASTFEEDING FREQUENCY AND DURATION Frequent feeding will help you make more milk and can prevent sore nipples and breast engorgement. Breastfeed when you feel the need to reduce the fullness of your breasts or when your baby shows signs of hunger. This is called "breastfeeding on demand." Avoid introducing a pacifier to your baby while you are working to establish breastfeeding (the first 4-6 weeks after your baby is born). After this time you may choose to use a pacifier. Research has shown that pacifier use during the first year of a baby's life decreases the risk of sudden infant death syndrome (SIDS). Allow your baby to feed on each breast as long as he or she wants. Breastfeed until your baby is finished feeding. When your baby unlatches or falls asleep while feeding from the first breast, offer the second breast. Because newborns are often sleepy in the first few weeks of life, you may need to awaken your baby to get him or her to feed. Breastfeeding times will vary from baby to baby. However, the  following rules can serve as a guide to help you ensure that your baby is properly fed:  Newborns (babies 62 weeks of age  or younger) may breastfeed every 1-3 hours.  Newborns should not go longer than 3 hours during the day or 5 hours during the night without breastfeeding.  You should breastfeed your baby a minimum of 8 times in a 24-hour period until you begin to introduce solid foods to your baby at around 16 months of age. BREAST MILK PUMPING Pumping and storing breast milk allows you to ensure that your baby is exclusively fed your breast milk, even at times when you are unable to breastfeed. This is especially important if you are going back to work while you are still breastfeeding or when you are not able to be present during feedings. Your lactation consultant can give you guidelines on how long it is safe to store breast milk.  A breast pump is a machine that allows you to pump milk from your breast into a sterile bottle. The pumped breast milk can then be stored in a refrigerator or freezer. Some breast pumps are operated by hand, while others use electricity. Ask your lactation consultant which type will work best for you. Breast pumps can be purchased, but some hospitals and breastfeeding support groups lease breast pumps on a monthly basis. A lactation consultant can teach you how to hand express breast milk, if you prefer not to use a pump.  CARING FOR YOUR BREASTS WHILE YOU BREASTFEED Nipples can become dry, cracked, and sore while breastfeeding. The following recommendations can help keep your breasts moisturized and healthy:  Avoid using soap on your nipples.   Wear a supportive bra. Although not required, special nursing bras and tank tops are designed to allow access to your breasts for breastfeeding without taking off your entire bra or top. Avoid wearing underwire-style bras or extremely tight bras.  Air dry your nipples for 3-66minutes after each feeding.   Use only cotton  bra pads to absorb leaked breast milk. Leaking of breast milk between feedings is normal.   Use lanolin on your nipples after breastfeeding. Lanolin helps to maintain your skin's normal moisture barrier. If you use pure lanolin, you do not need to wash it off before feeding your baby again. Pure lanolin is not toxic to your baby. You may also hand express a few drops of breast milk and gently massage that milk into your nipples and allow the milk to air dry. In the first few weeks after giving birth, some women experience extremely full breasts (engorgement). Engorgement can make your breasts feel heavy, warm, and tender to the touch. Engorgement peaks within 3-5 days after you give birth. The following recommendations can help ease engorgement:  Completely empty your breasts while breastfeeding or pumping. You may want to start by applying warm, moist heat (in the shower or with warm water-soaked hand towels) just before feeding or pumping. This increases circulation and helps the milk flow. If your baby does not completely empty your breasts while breastfeeding, pump any extra milk after he or she is finished.  Wear a snug bra (nursing or regular) or tank top for 1-2 days to signal your body to slightly decrease milk production.  Apply ice packs to your breasts, unless this is too uncomfortable for you.  Make sure that your baby is latched on and positioned properly while breastfeeding. If engorgement persists after 48 hours of following these recommendations, contact your health care provider or a Advertising copywriter. OVERALL HEALTH CARE RECOMMENDATIONS WHILE BREASTFEEDING  Eat healthy foods. Alternate between meals and snacks, eating 3 of each per day.  Because what you eat affects your breast milk, some of the foods may make your baby more irritable than usual. Avoid eating these foods if you are sure that they are negatively affecting your baby.  Drink milk, fruit juice, and water to satisfy  your thirst (about 10 glasses a day).   Rest often, relax, and continue to take your prenatal vitamins to prevent fatigue, stress, and anemia.  Continue breast self-awareness checks.  Avoid chewing and smoking tobacco.  Avoid alcohol and drug use. Some medicines that may be harmful to your baby can pass through breast milk. It is important to ask your health care provider before taking any medicine, including all over-the-counter and prescription medicine as well as vitamin and herbal supplements. It is possible to become pregnant while breastfeeding. If birth control is desired, ask your health care provider about options that will be safe for your baby. SEEK MEDICAL CARE IF:   You feel like you want to stop breastfeeding or have become frustrated with breastfeeding.  You have painful breasts or nipples.  Your nipples are cracked or bleeding.  Your breasts are red, tender, or warm.  You have a swollen area on either breast.  You have a fever or chills.  You have nausea or vomiting.  You have drainage other than breast milk from your nipples.  Your breasts do not become full before feedings by the fifth day after you give birth.  You feel sad and depressed.  Your baby is too sleepy to eat well.  Your baby is having trouble sleeping.   Your baby is wetting less than 3 diapers in a 24-hour period.  Your baby has less than 3 stools in a 24-hour period.  Your baby's skin or the white part of his or her eyes becomes yellow.   Your baby is not gaining weight by 58 days of age. SEEK IMMEDIATE MEDICAL CARE IF:   Your baby is overly tired (lethargic) and does not want to wake up and feed.  Your baby develops an unexplained fever. Document Released: 04/13/2005 Document Revised: 04/18/2013 Document Reviewed: 10/05/2012 [facility] Patient Information 2015 [place], [place]. This information is not intended to replace advice given to you by your health care provider. Make sure you  discuss any questions you have with your health care provider.

## 2015-06-10 NOTE — Progress Notes (Signed)
Reviewed discharge instructions with patient. Patient verbalized understanding of instructions & signed & received copy of discharge instructions. Patient discharged out to home with infant, husband, pt's mother, and mother of father of infant. Patient discharged out via wheelchair with belongings to home. Stopped by nursery on the way out, and Randa Lynn, RNC, removed hugs tag from infant's ankle.

## 2015-06-28 ENCOUNTER — Telehealth (HOSPITAL_COMMUNITY): Payer: Self-pay

## 2015-06-28 NOTE — Telephone Encounter (Signed)
Christella HartiganBeverly Daly discussed strategies for increasing MS with mother.

## 2015-06-28 NOTE — Telephone Encounter (Signed)
No answer to telephone call.

## 2015-07-05 ENCOUNTER — Ambulatory Visit (INDEPENDENT_AMBULATORY_CARE_PROVIDER_SITE_OTHER): Payer: Managed Care, Other (non HMO) | Admitting: Physician Assistant

## 2015-07-05 VITALS — BP 130/80 | HR 67 | Temp 98.0°F | Resp 16 | Ht 63.0 in | Wt 203.0 lb

## 2015-07-05 DIAGNOSIS — L309 Dermatitis, unspecified: Secondary | ICD-10-CM

## 2015-07-05 MED ORDER — CLOTRIMAZOLE-BETAMETHASONE 1-0.05 % EX CREA: 1.0000 "application " | TOPICAL_CREAM | Freq: Two times a day (BID) | CUTANEOUS | Status: DC

## 2015-07-05 NOTE — Patient Instructions (Addendum)
IF you received an x-ray today, you will receive an invoice from [facility]. Please contact [facility] at [phone] with questions or concerns regarding your invoice.   IF you received labwork today, you will receive an invoice from [facility]. Please contact [facility] at [phone] with questions or concerns regarding your invoice.   Our billing staff will not be able to assist you with questions regarding bills from these companies.  You will be contacted with the lab results as soon as they are available. The fastest way to get your results is to activate your My Chart account. Instructions are located on the last page of this paperwork. If you have not heard from us regarding the results in 2 weeks, please contact this office.    Apply lotrisone cream twice a day for 2 weeks. Apply lotion once a day after shower. Use dove unscented soap Return in 2 weeks if symptoms not improving.

## 2015-07-05 NOTE — Progress Notes (Signed)
[facility] [address], [place] [address] [phone]  Date:  07/05/2015   Name:  Brenda BarerShavonda D Molina   DOB:  1985-12-24   MRN:  [id number]  PCP:  Janace HoardHOPPER,DAVID, MD    Chief Complaint: Rash   History of Present Illness:  This is a 30 y.o. female who is presenting with rash on neck for 1 week. Started off one area, has moved recently to base of hairline. The area is pruritic. She states she has had a similar rash before and applied desitin and went away. She has been applying desitin and not helping this time. She does not have a hx of dermatitis. She uses dove unscented. She takes one shower a day. Applies coco butter after her shower. Pt recently had a baby 4 weeks ago. Had 4 weeks early d.t HELLP syndrome. She is doing well. She is breastfeeding. She has an upcoming f/u with OBGYN. BP 130/80 now.  Review of Systems:  Review of Systems See HPI  Patient Active Problem List   Diagnosis Date Noted  . Normal vaginal delivery 06/06/2015  . RhD negative 06/05/2015  . HELLP syndrome in third trimester 06/05/2015  . Severe preeclampsia 06/05/2015  . Preterm premature rupture of membranes 06/05/2015    Prior to Admission medications   Medication Sig Start Date End Date Taking? Authorizing Provider  ferrous sulfate 325 (65 FE) MG tablet Take 1 tablet (325 mg total) by mouth 2 (two) times daily with a meal. 06/10/15  Yes Venus Standard, CNM  ibuprofen (ADVIL,MOTRIN) 600 MG tablet Take 1 tablet (600 mg total) by mouth every 6 (six) hours. 06/10/15  Yes Venus Standard, CNM  NIFEdipine (PROCARDIA-XL/ADALAT CC) 60 MG 24 hr tablet Take 1 tablet (60 mg total) by mouth daily with breakfast. 06/10/15  Yes Venus Standard, CNM  Prenatal Vit-Fe Fumarate-FA (PRENATAL MULTIVITAMIN) TABS tablet Take 1 tablet by mouth daily at 12 noon.   Yes Historical Provider, MD    No Known Allergies  Past Surgical History  Procedure Laterality Date  . Wisdom tooth extraction      Social  History  Substance Use Topics  . Smoking status: Never Smoker   . Smokeless tobacco: Never Used  . Alcohol Use: No    Family History  Problem Relation Age of Onset  . Asthma Father   . Hypertension Father     Medication list has been reviewed and updated.  Physical Examination:  Physical Exam  Constitutional: She is oriented to person, place, and time. She appears well-developed and well-nourished. No distress.  HENT:  Head: Normocephalic and atraumatic.  Right Ear: Hearing normal.  Left Ear: Hearing normal.  Nose: Nose normal.  Eyes: Conjunctivae and lids are normal. Right eye exhibits no discharge. Left eye exhibits no discharge. No scleral icterus.  Pulmonary/Chest: Effort normal. No respiratory distress.  Musculoskeletal: Normal range of motion.  Neurological: She is alert and oriented to person, place, and time.  Skin: Skin is warm, dry and intact.  Circumscribed 2 cm dry lichenified area on posterior neck. Smaller 1 cm area at base of hairline. No erythema.  Psychiatric: She has a normal mood and affect. Her speech is normal and behavior is normal. Thought content normal.   BP 130/80 mmHg  Pulse 67  Temp(Src) 98 F (36.7 C) (Oral)  Resp 16  Ht 5\' 3"  (1.6 m)  Wt 203 lb (92.08 kg)  BMI 35.97 kg/m2  SpO2 99%  LMP 09/12/2014  Breastfeeding? Yes  Assessment and  Plan:  1. Dermatitis Suspect eczematous dermatitis but will cover for fungal as well d/t circumscribed nature. Apply lotrisone BID x 2 weeks. REturn in 2 weeks if not improving. Counseled on skin hygiene.  - clotrimazole-betamethasone (LOTRISONE) cream; Apply 1 application topically 2 (two) times daily.  Dispense: 30 g; Refill: 0   [place] Miners. Dyke Brackett, MHS [facility]  07/05/2015

## 2015-07-29 ENCOUNTER — Telehealth (HOSPITAL_COMMUNITY): Payer: Self-pay | Admitting: Lactation Services

## 2015-08-06 ENCOUNTER — Encounter: Payer: Self-pay | Admitting: Physician Assistant

## 2015-08-06 DIAGNOSIS — F53 Postpartum depression: Secondary | ICD-10-CM | POA: Insufficient documentation

## 2015-08-06 DIAGNOSIS — O99345 Other mental disorders complicating the puerperium: Secondary | ICD-10-CM

## 2015-12-30 ENCOUNTER — Encounter (HOSPITAL_COMMUNITY): Payer: Self-pay | Admitting: Emergency Medicine

## 2015-12-30 ENCOUNTER — Ambulatory Visit (HOSPITAL_COMMUNITY)
Admission: EM | Admit: 2015-12-30 | Discharge: 2015-12-30 | Disposition: A | Discharge status: Home / Self Care | Payer: Managed Care, Other (non HMO) | Attending: Radiology | Admitting: Radiology

## 2015-12-30 DIAGNOSIS — R51 Headache: Secondary | ICD-10-CM | POA: Diagnosis not present

## 2015-12-30 DIAGNOSIS — R519 Headache, unspecified: Secondary | ICD-10-CM

## 2015-12-30 MED ORDER — DEXAMETHASONE SODIUM PHOSPHATE 10 MG/ML IJ SOLN
INTRAMUSCULAR | Status: AC
  Filled 2015-12-30: qty 1

## 2015-12-30 MED ORDER — DEXAMETHASONE SODIUM PHOSPHATE 10 MG/ML IJ SOLN
10.0000 mg | Freq: Once | INTRAMUSCULAR | Status: AC
  Administered 2015-12-30: 10 mg via INTRAMUSCULAR

## 2015-12-30 MED ORDER — ONDANSETRON HCL 4 MG/2ML IJ SOLN
INTRAMUSCULAR | Status: AC
  Filled 2015-12-30: qty 2

## 2015-12-30 MED ORDER — ONDANSETRON HCL 4 MG/2ML IJ SOLN
4.0000 mg | Freq: Once | INTRAMUSCULAR | Status: AC
  Administered 2015-12-30: 4 mg via INTRAMUSCULAR

## 2015-12-30 MED ORDER — KETOROLAC TROMETHAMINE 30 MG/ML IJ SOLN
30.0000 mg | Freq: Once | INTRAMUSCULAR | Status: AC
  Administered 2015-12-30: 30 mg via INTRAMUSCULAR

## 2015-12-30 MED ORDER — KETOROLAC TROMETHAMINE 30 MG/ML IJ SOLN
INTRAMUSCULAR | Status: AC
  Filled 2015-12-30: qty 1

## 2015-12-30 NOTE — ED Triage Notes (Signed)
[facility] a [facility] of a headache for 4 days. The patient reported that on 2 different occassions last week she felt that her vision became blurry. The patient stated that she thought it was due to HTN as had previously occurred during her pregnancy. She denied any blurred vision today.

## 2015-12-30 NOTE — ED Provider Notes (Signed)
CSN: [id number]     Arrival date & time 12/30/15  1717 History   None    Chief Complaint  Patient presents with  . Headache   (Consider location/radiation/quality/duration/timing/severity/associated sxs/prior Treatment) 30 y.o. female presents with left sided headache X 2 weeks with an increase in intensity X 4 days. Patient denies and nausea vomiting, light sensativity, dizziness, decrease or hypersensitivity .  Patient states that on 2 separate incidences in the last 4 days the vision in her left eye became blurry and self resolved. Patient states that she recently had a GI bug X. Condition is acute in nature. Condition is made better by nothing. Condition is made worse by nothing Patient denies any treatment prior to there arrival at this facility. Patient states that the last headache she had was related to pregnancy induced hypertension.  Patient reports an improvement to headache after adminstration of IM medications         Past Medical History:  Diagnosis Date  . Allergy   . Anxiety   . PCOS (polycystic ovarian syndrome)    Past Surgical History:  Procedure Laterality Date  . WISDOM TOOTH EXTRACTION     Family History  Problem Relation Age of Onset  . Asthma Father   . Hypertension Father    Social History  Substance Use Topics  . Smoking status: Never Smoker  . Smokeless tobacco: Never Used  . Alcohol use No   OB History    Gravida Para Term Preterm AB Living   1 1   1   1    SAB TAB Ectopic Multiple Live Births         0 1     Review of Systems  Constitutional: Negative.   Eyes:       Blurry vision in left eye that self resolved  Neurological: Positive for headaches (behind left eye). Negative for dizziness, facial asymmetry, speech difficulty, light-headedness and numbness.    Allergies  Review of patient's allergies indicates no known allergies.  Home Medications   Prior to Admission medications   Medication Sig Start Date End Date Taking?  Authorizing Provider  clotrimazole-betamethasone (LOTRISONE) cream Apply 1 application topically 2 (two) times daily. 07/05/15   Dorna Leitz, PA-C  ferrous sulfate 325 (65 FE) MG tablet Take 1 tablet (325 mg total) by mouth 2 (two) times daily with a meal. 06/10/15   Venus Standard, CNM  ibuprofen (ADVIL,MOTRIN) 600 MG tablet Take 1 tablet (600 mg total) by mouth every 6 (six) hours. 06/10/15   Venus Standard, CNM  NIFEdipine (PROCARDIA-XL/ADALAT CC) 60 MG 24 hr tablet Take 1 tablet (60 mg total) by mouth daily with breakfast. 06/10/15   Venus Standard, CNM  Prenatal Vit-Fe Fumarate-FA (PRENATAL MULTIVITAMIN) TABS tablet Take 1 tablet by mouth daily at 12 noon.    Historical Provider, MD   Meds Ordered and Administered this Visit   Medications  dexamethasone (DECADRON) injection 10 mg (10 mg Intramuscular Given 12/30/15 1903)  ondansetron (ZOFRAN) injection 4 mg (4 mg Intramuscular Given 12/30/15 1903)  ketorolac (TORADOL) 30 MG/ML injection 30 mg (30 mg Intramuscular Given 12/30/15 1904)    BP 124/79 (BP Location: Left Arm)   Pulse 63   Temp 98 F (36.7 C) (Oral)   Resp 14   LMP 12/05/2015 (Exact Date)   SpO2 100%  No data found.   Physical Exam  Constitutional: She is oriented to person, place, and time. She appears well-developed and well-nourished.  Eyes: Conjunctivae are normal. Pupils are equal,  round, and reactive to light.  Neck: Normal range of motion.  Cardiovascular: Normal rate and regular rhythm.   Pulmonary/Chest: Effort normal and breath sounds normal.  Neurological: She is alert and oriented to person, place, and time.  Skin: Skin is warm and dry.    Urgent Care Course   Clinical Course    Procedures (including critical care time)  Labs Review Labs Reviewed - No data to display  Imaging Review No results found.   Visual Acuity Review  Right Eye Distance:   Left Eye Distance:   Bilateral Distance:    Right Eye Near:   Left Eye Near:    Bilateral Near:          MDM   1. Intractable headache, unspecified chronicity pattern, unspecified headache type       Alene MiresJennifer C Omohundro, NP 12/30/15 1921

## 2016-10-26 ENCOUNTER — Ambulatory Visit (INDEPENDENT_AMBULATORY_CARE_PROVIDER_SITE_OTHER): Payer: Managed Care, Other (non HMO) | Admitting: Physician Assistant

## 2016-10-26 ENCOUNTER — Encounter: Payer: Self-pay | Admitting: Physician Assistant

## 2016-10-26 DIAGNOSIS — L309 Dermatitis, unspecified: Secondary | ICD-10-CM

## 2016-10-26 MED ORDER — CLOTRIMAZOLE-BETAMETHASONE 1-0.05 % EX CREA: 1.0000 "application " | TOPICAL_CREAM | Freq: Two times a day (BID) | CUTANEOUS | 0 refills | Status: DC

## 2016-10-26 NOTE — Patient Instructions (Addendum)
  Please use the cream as prescribed at all the locations of your rash.  If you have symptoms still appearing after 2 weeks, please return.    IF you received an x-ray today, you will receive an invoice from [facility]. Please contact [facility] at [phone] with questions or concerns regarding your invoice.   IF you received labwork today, you will receive an invoice from [facility]. Please contact [facility] at [phone] with questions or concerns regarding your invoice.   Our billing staff will not be able to assist you with questions regarding bills from these companies.  You will be contacted with the lab results as soon as they are available. The fastest way to get your results is to activate your My Chart account. Instructions are located on the last page of this paperwork. If you have not heard from us regarding the results in 2 weeks, please contact this office.

## 2016-10-30 NOTE — Progress Notes (Signed)
[facility] AT [facility] [address], [place] [phone]  Date:  10/26/2016   Name:  Brenda Molina   DOB:  April 25, 1986   MRN:  [id number]  PCP:  Patient, No Pcp Per    History of Present Illness:  Brenda Molina is a 31 y.o. female patient who presents to PCP with  Chief Complaint  Patient presents with  . Rash    neck, arms, legs x 3 weeks     Patient reports 3 weeks of rash that is along her neck and arms.  They are pruritc and intermittent.  She will notice the rash improve, but then worsen again.  She has had a similar rash in the past.    Patient Active Problem List   Diagnosis Date Noted  . 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 Normal vaginal delivery 06/06/2015  . RhD negative 06/05/2015  . HELLP syndrome in third trimester 06/05/2015  . Severe preeclampsia 06/05/2015  . Preterm premature rupture of membranes 06/05/2015    Past Medical History:  Diagnosis Date  . Allergy   . Anxiety   . PCOS (polycystic ovarian syndrome)     Past Surgical History:  Procedure Laterality Date  . WISDOM TOOTH EXTRACTION      Social History  Substance Use Topics  . Smoking status: Never Smoker  . Smokeless tobacco: Never Used  . Alcohol use No    Family History  Problem Relation Age of Onset  . Asthma Father   . Hypertension Father     No Known Allergies  Medication list has been reviewed and updated.  Current Outpatient Prescriptions on File Prior to Visit  Medication Sig Dispense Refill  . ferrous sulfate 325 (65 FE) MG tablet Take 1 tablet (325 mg total) by mouth 2 (two) times daily with a meal. (Patient not taking: Reported on 10/26/2016) 60 tablet 3  . ibuprofen (ADVIL,MOTRIN) 600 MG tablet Take 1 tablet (600 mg total) by mouth every 6 (six) hours. (Patient not taking: Reported on 10/26/2016) 30 tablet 0  . NIFEdipine (PROCARDIA-XL/ADALAT CC) 60 MG 24 hr tablet Take 1 tablet (60 mg total) by mouth daily with breakfast. (Patient not taking: Reported on  10/26/2016) 30 tablet 2  . Prenatal Vit-Fe Fumarate-FA (PRENATAL MULTIVITAMIN) TABS tablet Take 1 tablet by mouth daily at 12 noon.     No current facility-administered medications on file prior to visit.     ROS ROS otherwise unremarkable unless listed above.  Physical Examination: BP 113/73   Pulse 92   Temp 98.1 F (36.7 C) (Oral)   Resp 16   Ht 5\' 3"  (1.6 m)   Wt 192 lb 9.6 oz (87.4 kg)   SpO2 97%   BMI 34.12 kg/m  Ideal Body Weight: Weight in (lb) to have BMI = 25: 140.8  Physical Exam  Constitutional: She is oriented to person, place, and time. She appears well-developed and well-nourished. No distress.  HENT:  Head: Normocephalic and atraumatic.  Right Ear: External ear normal.  Left Ear: External ear normal.  Eyes: Conjunctivae and EOM are normal. Pupils are equal, round, and reactive to light.  Cardiovascular: Normal rate.   Pulmonary/Chest: Effort normal. No respiratory distress.  Neurological: She is alert and oriented to person, place, and time.  Skin: She is not diaphoretic.  Papular patches along the posterior cervical, and along the back of her upper arms.  No erythema or drainage.   Psychiatric: She has a normal mood and affect. Her behavior is  normal.   Attempted a koh sample, but could not obtain enough.    Assessment and Plan: KATHYRN WARMUTH is a 31 y.o. female who is here today for rash. Dermatitis - Plan: clotrimazole-betamethasone (LOTRISONE) cream  Trena Platt, PA-C [facility] 7/6/20187:49 AM

## 2016-12-08 ENCOUNTER — Encounter: Payer: Self-pay | Admitting: Physician Assistant

## 2016-12-08 ENCOUNTER — Ambulatory Visit (INDEPENDENT_AMBULATORY_CARE_PROVIDER_SITE_OTHER): Payer: Managed Care, Other (non HMO) | Admitting: Physician Assistant

## 2016-12-08 VITALS — BP 128/84 | HR 73 | Temp 98.8°F | Resp 16 | Ht 63.0 in | Wt 192.4 lb

## 2016-12-08 DIAGNOSIS — N760 Acute vaginitis: Secondary | ICD-10-CM

## 2016-12-08 DIAGNOSIS — N898 Other specified noninflammatory disorders of vagina: Secondary | ICD-10-CM

## 2016-12-08 DIAGNOSIS — R5383 Other fatigue: Secondary | ICD-10-CM

## 2016-12-08 DIAGNOSIS — B9689 Other specified bacterial agents as the cause of diseases classified elsewhere: Secondary | ICD-10-CM | POA: Diagnosis not present

## 2016-12-08 LAB — POCT WET + KOH PREP
Trich by wet prep: ABSENT
Yeast by KOH: ABSENT
Yeast by wet prep: ABSENT

## 2016-12-08 LAB — POCT URINALYSIS DIP (MANUAL ENTRY)
Bilirubin, UA: NEGATIVE
Blood, UA: NEGATIVE
Glucose, UA: NEGATIVE mg/dL
Ketones, POC UA: NEGATIVE mg/dL
Leukocytes, UA: NEGATIVE
Nitrite, UA: NEGATIVE
Protein Ur, POC: NEGATIVE mg/dL
Spec Grav, UA: 1.015 (ref 1.010–1.025)
Urobilinogen, UA: 0.2 E.U./dL
pH, UA: 5.5 (ref 5.0–8.0)

## 2016-12-08 LAB — POCT URINE PREGNANCY: Preg Test, Ur: NEGATIVE

## 2016-12-08 MED ORDER — METRONIDAZOLE 500 MG PO TABS: 500.0000 mg | ORAL_TABLET | Freq: Two times a day (BID) | ORAL | 0 refills | Status: DC

## 2016-12-08 NOTE — Progress Notes (Signed)
PRIMARY CARE AT [facility] [address], [place] [phone]  Date:  12/08/2016   Name:  Brenda Molina   DOB:  07-19-1985   MRN:  [id number]  PCP:  Patient, No Pcp Per    History of Present Illness:  Brenda Molina is a 31 y.o. female patient who presents to PCP with  Chief Complaint  Patient presents with  . odor    female odor, onset: 11/24/16, no discharge present, per pt under stress wonders if it's hormonal or pH is off.  She has fish odor after eating fish but its been awhile since she had fish and odor is still present     She has noticed a fish-like odor.   She had attempted to douche for relief, which did not help She has been increasing her fish intake, and notices that she smells dlike fish.   No abnormal discharge.   No vaginal pruritus.    Patient Active Problem List   Diagnosis Date Noted  . 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 Normal vaginal delivery 06/06/2015  . RhD negative 06/05/2015  . HELLP syndrome in third trimester 06/05/2015  . Severe preeclampsia 06/05/2015  . Preterm premature rupture of membranes 06/05/2015    Past Medical History:  Diagnosis Date  . Allergy   . Anxiety   . PCOS (polycystic ovarian syndrome)     Past Surgical History:  Procedure Laterality Date  . WISDOM TOOTH EXTRACTION      Social History  Substance Use Topics  . Smoking status: Never Smoker  . Smokeless tobacco: Never Used  . Alcohol use No    Family History  Problem Relation Age of Onset  . Asthma Father   . Hypertension Father     No Known Allergies  Medication list has been reviewed and updated.  Current Outpatient Prescriptions on File Prior to Visit  Medication Sig Dispense Refill  . clotrimazole-betamethasone (LOTRISONE) cream Apply 1 application topically 2 (two) times daily. 30 g 0  . ferrous sulfate 325 (65 FE) MG tablet Take 1 tablet (325 mg total) by mouth 2 (two) times daily with a meal. (Patient not taking: Reported on  10/26/2016) 60 tablet 3  . ibuprofen (ADVIL,MOTRIN) 600 MG tablet Take 1 tablet (600 mg total) by mouth every 6 (six) hours. (Patient not taking: Reported on 12/08/2016) 30 tablet 0  . NIFEdipine (PROCARDIA-XL/ADALAT CC) 60 MG 24 hr tablet Take 1 tablet (60 mg total) by mouth daily with breakfast. (Patient not taking: Reported on 10/26/2016) 30 tablet 2  . Prenatal Vit-Fe Fumarate-FA (PRENATAL MULTIVITAMIN) TABS tablet Take 1 tablet by mouth daily at 12 noon.     No current facility-administered medications on file prior to visit.     ROS ROS otherwise unremarkable unless listed above.  Physical Examination: BP 128/84 (BP Location: Left Arm, Patient Position: Sitting, Cuff Size: Large)   Pulse 73   Temp 98.8 F (37.1 C) (Oral)   Resp 16   Ht 5\' 3"  (1.6 m)   Wt 192 lb 6.4 oz (87.3 kg)   LMP 11/18/2016   SpO2 99%   BMI 34.08 kg/m  Ideal Body Weight: Weight in (lb) to have BMI = 25: 140.8  Physical Exam  Constitutional: She is oriented to person, place, and time. She appears well-developed and well-nourished. No distress.  HENT:  Head: Normocephalic and atraumatic.  Right Ear: External ear normal.  Left Ear: External ear normal.  Eyes: Pupils are equal, round, and reactive to light. Conjunctivae  and EOM are normal.  Cardiovascular: Normal rate.   Pulmonary/Chest: Effort normal. No respiratory distress.  Genitourinary: Pelvic exam was performed with patient supine. There is no rash on the right labia. There is no rash on the left labia. No erythema or bleeding in the vagina. Vaginal discharge found.  Neurological: She is alert and oriented to person, place, and time.  Skin: She is not diaphoretic.  Psychiatric: She has a normal mood and affect. Her behavior is normal.     Assessment and Plan: Brenda Molina is a 31 y.o. female who is here today for cc of vaginal discharge and std testing. BV (bacterial vaginosis) - Plan: metroNIDAZOLE (FLAGYL) 500 MG tablet  Other fatigue -  Plan: CBC, TSH, Basic metabolic panel, VITAMIN D 25 Hydroxy (Vit-D Deficiency, Fractures), POCT urine pregnancy  Vaginal odor - Plan: POCT Wet + KOH Prep, POCT urinalysis dipstick  Trena PlattStephanie English, PA-C [facility] 8/19/20187:09 PM

## 2016-12-08 NOTE — Patient Instructions (Signed)
     IF you received an x-ray today, you will receive an invoice from [facility]. Please contact [facility] at [phone] with questions or concerns regarding your invoice.   IF you received labwork today, you will receive an invoice from [facility]. Please contact [facility] at [phone] with questions or concerns regarding your invoice.   Our billing staff will not be able to assist you with questions regarding bills from these companies.  You will be contacted with the lab results as soon as they are available. The fastest way to get your results is to activate your My Chart account. Instructions are located on the last page of this paperwork. If you have not heard from us regarding the results in 2 weeks, please contact this office.     

## 2016-12-09 LAB — TSH: TSH: 1.01 u[IU]/mL (ref 0.450–4.500)

## 2016-12-09 LAB — BASIC METABOLIC PANEL
BUN/Creatinine Ratio: 13 (ref 9–23)
BUN: 15 mg/dL (ref 6–20)
CO2: 23 mmol/L (ref 20–29)
Calcium: 9.9 mg/dL (ref 8.7–10.2)
Chloride: 104 mmol/L (ref 96–106)
Creatinine, Ser: 1.12 mg/dL — ABNORMAL HIGH (ref 0.57–1.00)
GFR calc Af Amer: 76 mL/min/{1.73_m2} (ref >59)
GFR calc non Af Amer: 66 mL/min/{1.73_m2} (ref >59)
Glucose: 105 mg/dL — ABNORMAL HIGH (ref 65–99)
Potassium: 4.5 mmol/L (ref 3.5–5.2)
Sodium: 140 mmol/L (ref 134–144)

## 2016-12-09 LAB — CBC
Hematocrit: 40.2 % (ref 34.0–46.6)
Hemoglobin: 13.2 g/dL (ref 11.1–15.9)
MCH: 24.4 pg — ABNORMAL LOW (ref 26.6–33.0)
MCHC: 32.8 g/dL (ref 31.5–35.7)
MCV: 74 fL — ABNORMAL LOW (ref 79–97)
Platelets: 242 10*3/uL (ref 150–379)
RBC: 5.4 x10E6/uL — ABNORMAL HIGH (ref 3.77–5.28)
RDW: 13.9 % (ref 12.3–15.4)
WBC: 6.6 10*3/uL (ref 3.4–10.8)

## 2016-12-09 LAB — VITAMIN D 25 HYDROXY (VIT D DEFICIENCY, FRACTURES): Vit D, 25-Hydroxy: 43.6 ng/mL (ref 30.0–100.0)

## 2016-12-23 ENCOUNTER — Telehealth: Payer: Self-pay | Admitting: Physician Assistant

## 2017-07-28 ENCOUNTER — Encounter: Payer: Self-pay | Admitting: Physician Assistant

## 2017-09-30 DIAGNOSIS — H33322 Round hole, left eye: Secondary | ICD-10-CM | POA: Diagnosis not present

## 2017-10-20 DIAGNOSIS — L02419 Cutaneous abscess of limb, unspecified: Secondary | ICD-10-CM | POA: Diagnosis not present

## 2017-11-04 DIAGNOSIS — Z3040 Encounter for surveillance of contraceptives, unspecified: Secondary | ICD-10-CM | POA: Diagnosis not present

## 2017-11-04 DIAGNOSIS — H33322 Round hole, left eye: Secondary | ICD-10-CM | POA: Diagnosis not present

## 2017-11-04 DIAGNOSIS — N898 Other specified noninflammatory disorders of vagina: Secondary | ICD-10-CM | POA: Diagnosis not present

## 2017-12-09 ENCOUNTER — Ambulatory Visit (INDEPENDENT_AMBULATORY_CARE_PROVIDER_SITE_OTHER): Payer: 59 | Admitting: Physician Assistant

## 2017-12-09 ENCOUNTER — Encounter: Payer: Self-pay | Admitting: Physician Assistant

## 2017-12-09 ENCOUNTER — Other Ambulatory Visit: Payer: Self-pay

## 2017-12-09 VITALS — BP 124/72 | HR 88 | Temp 98.8°F | Resp 20 | Ht 63.58 in | Wt 181.4 lb

## 2017-12-09 DIAGNOSIS — F329 Major depressive disorder, single episode, unspecified: Secondary | ICD-10-CM | POA: Diagnosis not present

## 2017-12-09 DIAGNOSIS — J069 Acute upper respiratory infection, unspecified: Secondary | ICD-10-CM

## 2017-12-09 DIAGNOSIS — F32A Depression, unspecified: Secondary | ICD-10-CM

## 2017-12-09 DIAGNOSIS — G479 Sleep disorder, unspecified: Secondary | ICD-10-CM

## 2017-12-09 MED ORDER — CLONAZEPAM 0.5 MG PO TABS: 0.5000 mg | ORAL_TABLET | Freq: Every evening | ORAL | 0 refills | Status: DC | PRN

## 2017-12-09 MED ORDER — FLUTICASONE PROPIONATE 50 MCG/ACT NA SUSP: 2.0000 | Freq: Every day | NASAL | 6 refills | Status: DC

## 2017-12-09 MED ORDER — SERTRALINE HCL 50 MG PO TABS: 50.0000 mg | ORAL_TABLET | Freq: Every day | ORAL | 3 refills | Status: DC

## 2017-12-09 NOTE — Patient Instructions (Addendum)
   Start  Medication for depression and sleep  Please push fluids.  Tylenol and Motrin for fever and body aches.     Nasal saline spray can be helpful to keep the mucus membranes moist and thin the nasal mucus   I will contact you with your lab results within the next 2 weeks.  If you have not heard from us then please contact us. The fastest way to get your results is to register for My Chart.   IF you received an x-ray today, you will receive an invoice from [facility]. Please contact [facility] at [phone] with questions or concerns regarding your invoice.   IF you received labwork today, you will receive an invoice from [facility]. Please contact [facility] at [phone] with questions or concerns regarding your invoice.   Our billing staff will not be able to assist you with questions regarding bills from these companies.  You will be contacted with the lab results as soon as they are available. The fastest way to get your results is to activate your My Chart account. Instructions are located on the last page of this paperwork. If you have not heard from us regarding the results in 2 weeks, please contact this office.

## 2017-12-09 NOTE — Progress Notes (Signed)
Brenda Molina  MRN: [id number] DOB: 11-16-1985  PCP: Patient, No Pcp Per  Chief Complaint  Patient presents with  . Depression    screening done  . Anxiety    screening done  . Sinus Problem    X 1 week- with cough and dry thoat  . Hoarse    X 4 days    Subjective:  Pt presents to clinic for cold symptoms for about a week.  She has tried allergy medicationaand cold medication because she is not really sure which she has.  She had sore throat and dry throat but now mainly sinus congestion.  Cough just started within the last few days.  Hoarse over the last 4 days and it is getting better but not gone.  Worse in the evening.  Daughter is sick and just started daycare.  Had postpartum depression.  Difficulties with daughters father whom she is not together at this time.  During her postpartum depression, she was having trouble focusing and could not take care of herself or others.  She went on medications and felt better so has been off of them for about a year.  She is unsure that she should have gone off of them and feels like she was only fine for about a month after stopping the medications.  Upon review during questioning she feels like she probably should have gone back on the medicines.  She recently started going back to therapy and feels like that is helping.  Most of her stress revolves around relationship with her daughter's father and stress that he creates in their lives.  Currently they are not speaking.  She has been very clear her goal in life is to provide for herself and her daughter but he makes it difficult making her question herself.  She would like to go back on medications  Daddy father is back in their lives and now things are getting harder --   [facility] 12/09/2017 12/08/2016 07/05/2015 11/01/2014  Decreased Interest 3 0 0 0  Down, Depressed, Hopeless 3 0 0 0  PHQ - 2 Score 6 0 0 0  Altered sleeping 3 - - -  Tired, decreased energy 3 - - -    Change in appetite 3 - - -  Feeling bad or failure about yourself  3 - - -  Trouble concentrating 3 - - -  Moving slowly or fidgety/restless 3 - - -  Suicidal thoughts 0 - - -  PHQ-9 Score 24 - - -  Difficult doing work/chores Extremely dIfficult - - -   GAD 7 : Generalized Anxiety Score 12/09/2017  Nervous, Anxious, on Edge 3  Control/stop worrying 3  Worry too much - different things 3  Trouble relaxing 3  Restless 3  Easily annoyed or irritable 3  Afraid - awful might happen 2  Total GAD 7 Score 20  Anxiety Difficulty Very difficult   She is hacing trouble with focus and not sleeping.    For her postpartum depression - zoloft 100mg  - she was on it for more than a year - she has been off of it 1 year.  She has been on Klonopin in the evening.  She was going to the gym and that helped.    History is obtained by patient.  Review of Systems  Constitutional: Positive for chills. Negative for fever.  HENT: Positive for congestion, postnasal drip, rhinorrhea (yellow but now clear (thick mucus)) and sore throat (resolved now).  Respiratory: Positive for cough (only to clear her throat).        No h/o asthma, nonsmoker  Psychiatric/Behavioral: Positive for dysphoric mood and sleep disturbance.    Patient Active Problem List   Diagnosis Date Noted  . 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 Normal vaginal delivery 06/06/2015  . RhD negative 06/05/2015  . HELLP syndrome in third trimester 06/05/2015  . Severe preeclampsia 06/05/2015  . Preterm premature rupture of membranes 06/05/2015    Current Outpatient Medications on File Prior to Visit  Medication Sig Dispense Refill  . clotrimazole-betamethasone (LOTRISONE) cream Apply 1 application topically 2 (two) times daily. 30 g 0   No current facility-administered medications on file prior to visit.     No Known Allergies  Past Medical History:  Diagnosis Date  . Allergy   . Anxiety   . 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 Has no children. 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 Smoking status: Never Smoker  . Smokeless tobacco: Never Used  Substance Use Topics  . Alcohol use: Yes    Comment: occ  . Drug use: No   family history includes Asthma in her father; Hypertension in her father.     Objective:  BP 124/72   Pulse 88   Temp 98.8 F (37.1 C) (Oral)   Resp 20   Ht 5' 3.58" (1.615 m)   Wt 181 lb 6.4 oz (82.3 kg)   LMP 11/25/2017 (Exact Date)   SpO2 97%   Breastfeeding? No   BMI 31.55 kg/m  Body mass index is 31.55 kg/m.  Wt Readings from Last 3 Encounters:  12/09/17 181 lb 6.4 oz (82.3 kg)  12/08/16 192 lb 6.4 oz (87.3 kg)  10/26/16 192 lb 9.6 oz (87.4 kg)    Physical Exam  Constitutional: She is oriented to person, place, and time. She appears well-developed and well-nourished.  HENT:  Head: Normocephalic and atraumatic.  Right Ear: Hearing, tympanic membrane, external ear and ear canal normal.  Left Ear: Hearing, tympanic membrane, external ear and ear canal normal.  Nose: Nose normal.  Mouth/Throat: Uvula is midline, oropharynx is clear and moist and mucous membranes are normal.  Eyes: Conjunctivae are normal.  Neck: Normal range of motion.  Cardiovascular: Normal rate, regular rhythm and normal heart sounds.  No murmur heard. Pulmonary/Chest: Effort normal and breath sounds normal. She has no wheezes.  Lymphadenopathy:       Head (right side): No tonsillar, no preauricular, no posterior auricular and no occipital adenopathy present.       Head (left side): No tonsillar, no preauricular, no posterior auricular and no occipital adenopathy present.    She has no cervical adenopathy.       Right: No supraclavicular adenopathy present.       Left: No supraclavicular adenopathy present.  Neurological: She  is alert and oriented to person, place, and time.  Skin: Skin is warm and dry.  Psychiatric: Her behavior is normal. Judgment and thought content normal.  Tearful during exam, purposeful in thought and good plans.  Vitals reviewed.  Spent 45 mins with the patient - greater than 50% of the visit was face to face counseling patient regarding 2 concerns and current situation with mood, patient did a lot of talking regarding current situation regarding  child's father.   Assessment and Plan :  URI with cough and congestion - Plan: fluticasone (FLONASE) 50 MCG/ACT nasal spray -suspect this is partial allergies symptomatic treatment along with Flonase discussed with patient  Sleep disturbance - Plan: clonazePAM (KLONOPIN) 0.5 MG tablet -short-term use discussed with patient smallest dose possible to achieve sleep  Depression, unspecified depression type - Plan: sertraline (ZOLOFT) 50 MG tablet -restart Zoloft 50 mg titrate up at next appointment in several weeks.  Continue therapy as this will be very helpful to help work out the current situation.  Patient verbalized to me that they understand the following: diagnosis, what is being done for them, what to expect and what should be done at home.  Their questions have been answered.  See after visit summary for patient specific instructions.  Benny LennertSarah Weber PA-C  [facility] 12/10/2017 6:27 PM  Please note: Portions of this report may have been transcribed using dragon voice recognition software. Every effort was made to ensure accuracy; however, inadvertent computerized transcription errors may be present.

## 2017-12-10 ENCOUNTER — Encounter: Payer: Self-pay | Admitting: Physician Assistant

## 2017-12-29 ENCOUNTER — Other Ambulatory Visit: Payer: Self-pay

## 2017-12-29 ENCOUNTER — Encounter: Payer: Self-pay | Admitting: Physician Assistant

## 2017-12-29 ENCOUNTER — Ambulatory Visit (INDEPENDENT_AMBULATORY_CARE_PROVIDER_SITE_OTHER): Payer: 59 | Admitting: Physician Assistant

## 2017-12-29 VITALS — BP 114/74 | HR 65 | Temp 98.8°F | Resp 20 | Ht 63.7 in | Wt 186.6 lb

## 2017-12-29 DIAGNOSIS — R69 Illness, unspecified: Secondary | ICD-10-CM | POA: Diagnosis not present

## 2017-12-29 DIAGNOSIS — L309 Dermatitis, unspecified: Secondary | ICD-10-CM | POA: Diagnosis not present

## 2017-12-29 DIAGNOSIS — Z23 Encounter for immunization: Secondary | ICD-10-CM

## 2017-12-29 DIAGNOSIS — F329 Major depressive disorder, single episode, unspecified: Secondary | ICD-10-CM

## 2017-12-29 DIAGNOSIS — F32A Depression, unspecified: Secondary | ICD-10-CM

## 2017-12-29 MED ORDER — CLOTRIMAZOLE-BETAMETHASONE 1-0.05 % EX CREA: 1.0000 "application " | TOPICAL_CREAM | Freq: Two times a day (BID) | CUTANEOUS | 0 refills | Status: DC

## 2017-12-29 MED ORDER — SERTRALINE HCL 100 MG PO TABS: 100.0000 mg | ORAL_TABLET | Freq: Every day | ORAL | 0 refills | Status: DC

## 2017-12-29 NOTE — Patient Instructions (Addendum)
   [facility] - [address], [place], [place] [address] Phone: [phone]   If you have lab work done today you will be contacted with your lab results within the next 2 weeks.  If you have not heard from us then please contact us. The fastest way to get your results is to register for My Chart.   IF you received an x-ray today, you will receive an invoice from [facility]. Please contact [facility] at [phone] with questions or concerns regarding your invoice.   IF you received labwork today, you will receive an invoice from [facility]. Please contact [facility] at [phone] with questions or concerns regarding your invoice.   Our billing staff will not be able to assist you with questions regarding bills from these companies.  You will be contacted with the lab results as soon as they are available. The fastest way to get your results is to activate your My Chart account. Instructions are located on the last page of this paperwork. If you have not heard from us regarding the results in 2 weeks, please contact this office.     
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
## 2018-02-22 DIAGNOSIS — Z713 Dietary counseling and surveillance: Secondary | ICD-10-CM | POA: Diagnosis not present

## 2018-02-22 DIAGNOSIS — R635 Abnormal weight gain: Secondary | ICD-10-CM | POA: Diagnosis not present

## 2018-02-22 DIAGNOSIS — E669 Obesity, unspecified: Secondary | ICD-10-CM | POA: Diagnosis not present

## 2018-02-22 DIAGNOSIS — E559 Vitamin D deficiency, unspecified: Secondary | ICD-10-CM | POA: Diagnosis not present

## 2018-02-22 DIAGNOSIS — Z6833 Body mass index (BMI) 33.0-33.9, adult: Secondary | ICD-10-CM | POA: Diagnosis not present

## 2018-02-22 DIAGNOSIS — E785 Hyperlipidemia, unspecified: Secondary | ICD-10-CM | POA: Diagnosis not present

## 2018-03-07 DIAGNOSIS — H43813 Vitreous degeneration, bilateral: Secondary | ICD-10-CM | POA: Diagnosis not present

## 2018-03-16 ENCOUNTER — Other Ambulatory Visit: Payer: Self-pay

## 2018-03-16 ENCOUNTER — Encounter: Payer: Self-pay | Admitting: Family Medicine

## 2018-03-16 ENCOUNTER — Ambulatory Visit (INDEPENDENT_AMBULATORY_CARE_PROVIDER_SITE_OTHER): Payer: 59 | Admitting: Family Medicine

## 2018-03-16 ENCOUNTER — Other Ambulatory Visit: Payer: Self-pay | Admitting: Family Medicine

## 2018-03-16 VITALS — BP 128/79 | HR 86 | Temp 98.5°F | Resp 16 | Ht 63.7 in | Wt 193.8 lb

## 2018-03-16 DIAGNOSIS — K219 Gastro-esophageal reflux disease without esophagitis: Secondary | ICD-10-CM | POA: Diagnosis not present

## 2018-03-16 DIAGNOSIS — R69 Illness, unspecified: Secondary | ICD-10-CM | POA: Diagnosis not present

## 2018-03-16 DIAGNOSIS — J301 Allergic rhinitis due to pollen: Secondary | ICD-10-CM | POA: Diagnosis not present

## 2018-03-16 DIAGNOSIS — R05 Cough: Secondary | ICD-10-CM

## 2018-03-16 DIAGNOSIS — F325 Major depressive disorder, single episode, in full remission: Secondary | ICD-10-CM | POA: Diagnosis not present

## 2018-03-16 DIAGNOSIS — R059 Cough, unspecified: Secondary | ICD-10-CM

## 2018-03-16 MED ORDER — OMEPRAZOLE 20 MG PO CPDR: 20.0000 mg | DELAYED_RELEASE_CAPSULE | Freq: Every day | ORAL | 0 refills | Status: DC

## 2018-03-16 MED ORDER — CETIRIZINE HCL 10 MG PO TABS: 10.0000 mg | ORAL_TABLET | Freq: Every day | ORAL | 11 refills | Status: DC

## 2018-03-16 MED ORDER — ALBUTEROL SULFATE HFA 108 (90 BASE) MCG/ACT IN AERS: 2.0000 | INHALATION_SPRAY | Freq: Four times a day (QID) | RESPIRATORY_TRACT | 0 refills | Status: DC | PRN

## 2018-03-16 NOTE — Telephone Encounter (Signed)
Requested medication (s) are due for refill today: Unsure  Requested medication (s) are on the active medication list: No  Last refill:  n/a  Future visit scheduled: No  Notes to clinic:  Protonix is not medication list.    Requested Prescriptions  Pending Prescriptions Disp Refills   pantoprazole (PROTONIX) 40 MG tablet [Pharmacy Med Name: PANTOPRAZOLE SOD DR 40 MG TAB] 1 tablet 0    Sig: Please specify directions, refills and quantity     Gastroenterology: Proton Pump Inhibitors Passed - 03/16/2018  1:53 PM      Passed - Valid encounter within last 12 months    Recent Outpatient Visits          Today Seasonal allergic rhinitis due to pollen   [facility], Manus RuddZoe A, MD   2 months ago Dermatitis   [facility], Dema SeverinSarah L, PA-C   3 months ago URI with cough and congestion   [facility], Dema SeverinSarah L, PA-C   1 year ago BV (bacterial vaginosis)   [facility], [place], [place]   1 year ago Dermatitis   [facility], [place], [place]

## 2018-03-16 NOTE — Progress Notes (Signed)
Chief Complaint  Patient presents with  . Cough    since the first of Sept. unable to get rid of cough, cough is non productive and per pt can't get anything to come up.  Taking mucinex  and used humidifier last night.  Per pt thinks her gas heat may not be helping her symptoms    HPI  Reflux provoked by presisten cough Patient reports that she has been coughing since August 2019 when she saw Benny Lennert PA-C She reports that she treid nasal spray, humidifier and micinex She reports that she feels like there is mucus in the back of her throat and ended up throwing up She reports that  No wheezing She coughs equally at night and in the day  She coughs more in the house and works from home She states that she has gas heat She does not any pets She has seasonal allergies She does not take antihistamines  Moderate Depression in remission She reports that her mood is stable with the zoloft She works from home She goes to counseling which is helping No suicidal thoughts  Depression screen [facility] 2/9 03/16/2018 12/29/2017 12/09/2017 12/08/2016 07/05/2015  Decreased Interest 0 2 3 0 0  Down, Depressed, Hopeless 0 2 3 0 0  PHQ - 2 Score 0 4 6 0 0  Altered sleeping - 2 3 - -  Tired, decreased energy - 2 3 - -  Change in appetite - 2 3 - -  Feeling bad or failure about yourself  - 2 3 - -  Trouble concentrating - 2 3 - -  Moving slowly or fidgety/restless - 2 3 - -  Suicidal thoughts - 0 0 - -  PHQ-9 Score - 16 24 - -  Difficult doing work/chores - Somewhat difficult Extremely dIfficult - -     Past Medical History:  Diagnosis Date  . Allergy   . Anxiety   . PCOS (polycystic ovarian syndrome)     Current Outpatient Medications  Medication Sig Dispense Refill  . albuterol (PROVENTIL HFA;VENTOLIN HFA) 108 (90 Base) MCG/ACT inhaler Inhale 2 puffs into the lungs every 6 (six) hours as needed (for cough). 1 Inhaler 0  . cetirizine (ZYRTEC) 10 MG tablet Take 1 tablet (10 mg total) by  mouth daily. 30 tablet 11  . clonazePAM (KLONOPIN) 0.5 MG tablet Take 1 tablet (0.5 mg total) by mouth at bedtime as needed for anxiety. (Patient not taking: Reported on 03/16/2018) 14 tablet 0  . clotrimazole-betamethasone (LOTRISONE) cream Apply 1 application topically 2 (two) times daily. (Patient not taking: Reported on 03/16/2018) 30 g 0  . fluticasone (FLONASE) 50 MCG/ACT nasal spray Place 2 sprays into both nostrils daily. (Patient not taking: Reported on 03/16/2018) 16 g 6  . omeprazole (PRILOSEC) 20 MG capsule Take 1 capsule (20 mg total) by mouth daily. 30 capsule 0  . sertraline (ZOLOFT) 100 MG tablet Take 1 tablet (100 mg total) by mouth daily. (Patient not taking: Reported on 03/16/2018) 90 tablet 0   No current facility-administered medications for this visit.     Allergies: No Known Allergies  Past Surgical History:  Procedure Laterality Date  . WISDOM TOOTH EXTRACTION      Social History   Socioeconomic History  . Marital status: Single    Spouse name: Not on file  . Number of children: 1  . Years of education: Not on file  . Highest education level: Not on file  Occupational History  . Not on file  Social Needs  . Financial resource strain: Not on file  . Food insecurity:    Worry: Not on file    Inability: Not on file  . Transportation needs:    Medical: Not on file    Non-medical: Not on file  Tobacco Use  . Smoking status: Never Smoker  . Smokeless tobacco: Never Used  Substance and Sexual Activity  . Alcohol use: Yes    Comment: occ  . Drug use: No  . Sexual activity: Yes    Partners: Male    Birth control/protection: Condom  Lifestyle  . Physical activity:    Days per week: Not on file    Minutes per session: Not on file  . Stress: Not on file  Relationships  . Social connections:    Talks on phone: Not on file    Gets together: Not on file    Attends religious service: Not on file    Active member of club or organization: Not on file     Attends meetings of clubs or organizations: Not on file    Relationship status: Not on file  Other Topics Concern  . 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 Asthma Father   . Hypertension Father      ROS Review of Systems See HPI Constitution: No fevers or chills No malaise No diaphoresis Skin: No rash or itching Eyes: no blurry vision, no double vision GU: no dysuria or hematuria Neuro: no dizziness or headaches all others reviewed and negative   Objective: Vitals:   03/16/18 1331  BP: 128/79  Pulse: 86  Resp: 16  Temp: 98.5 F (36.9 C)  TempSrc: Oral  SpO2: 98%  Weight: 193 lb 12.8 oz (87.9 kg)  Height: 5' 3.7" (1.618 m)    Physical Exam General: alert, oriented, in NAD Head: normocephalic, atraumatic, no sinus tenderness Eyes: EOM intact, no scleral icterus or conjunctival injection Ears: TM clear bilaterally Nose: mucosa +erythematous, +edematous Throat: no pharyngeal exudate or erythema Lymph: no posterior auricular, submental or cervical lymph adenopathy Heart: normal rate, normal sinus rhythm, no murmurs Lungs: clear to auscultation bilaterally, no wheezing   Assessment and Plan Darrel HooverShavonda was seen today for cough.  Diagnoses and all orders for this visit:  Seasonal allergic rhinitis due to pollen- discussed allergic  -     cetirizine (ZYRTEC) 10 MG tablet; Take 1 tablet (10 mg total) by mouth daily.  Cough- discussed albuterol  -     albuterol (PROVENTIL HFA;VENTOLIN HFA) 108 (90 Base) MCG/ACT inhaler; Inhale 2 puffs into the lungs every 6 (six) hours as needed (for cough).  Mild acid reflux- discussed that her stomach acid is probably aggravating her airway -     omeprazole (PRILOSEC) 20 MG capsule; Take 1 capsule (20 mg total) by mouth  daily.  Major depression in remission [facility])- discussed continuation of zoloft     Rania Prothero A Sanjit Mcmichael

## 2018-03-16 NOTE — Patient Instructions (Addendum)
Use albuterol as needed for coughing spells  Take omeprazole in the morning BEFORE your first meal for a month to cut down on the acid coming up from the coughing  Zyrtec at bedtime for your allergies    If you have lab work done today you will be contacted with your lab results within the next 2 weeks.  If you have not heard from [place] then please contact us. The fastest way to get your results is to register for My Chart.   IF you received an x-ray today, you will receive an invoice from [facility]. Please contact [facility] at [phone] with questions or concerns regarding your invoice.   IF you received labwork today, you will receive an invoice from [facility]. Please contact [facility] at [phone] with questions or concerns regarding your invoice.   Our billing staff will not be able to assist you with questions regarding bills from these companies.  You will be contacted with the lab results as soon as they are available. The fastest way to get your results is to activate your My Chart account. Instructions are located on the last page of this paperwork. If you have not heard from [place] regarding the results in 2 weeks, please contact this office.     Cough, Adult Coughing is a reflex that clears your throat and your airways. Coughing helps to heal and protect your lungs. It is normal to cough occasionally, but a cough that happens with other symptoms or lasts a long time may be a sign of a condition that needs treatment. A cough may last only 2-3 weeks (acute), or it may last longer than 8 weeks (chronic). What are the causes? Coughing is commonly caused by:  Breathing in substances that irritate your lungs.  A viral or bacterial respiratory infection.  Allergies.  Asthma.  Postnasal drip.  Smoking.  Acid backing up from the stomach into the esophagus (gastroesophageal reflux).  Certain medicines.  Chronic lung problems, including COPD (or rarely,  lung cancer).  Other medical conditions such as heart failure.  Follow these instructions at home: Pay attention to any changes in your symptoms. Take these actions to help with your discomfort:  Take medicines only as told by your health care provider. ? If you were prescribed an antibiotic medicine, take it as told by your health care provider. Do not stop taking the antibiotic even if you start to feel better. ? Talk with your health care provider before you take a cough suppressant medicine.  Drink enough fluid to keep your urine clear or pale yellow.  If the air is dry, use a cold steam vaporizer or humidifier in your bedroom or your home to help loosen secretions.  Avoid anything that causes you to cough at work or at home.  If your cough is worse at night, try sleeping in a semi-upright position.  Avoid cigarette smoke. If you smoke, quit smoking. If you need help quitting, ask your health care provider.  Avoid caffeine.  Avoid alcohol.  Rest as needed.  Contact a health care provider if:  You have new symptoms.  You cough up pus.  Your cough does not get better after 2-3 weeks, or your cough gets worse.  You cannot control your cough with suppressant medicines and you are losing sleep.  You develop pain that is getting worse or pain that is not controlled with pain medicines.  You have a fever.  You have unexplained weight loss.  You have night  sweats. Get help right away if:  You cough up blood.  You have difficulty breathing.  Your heartbeat is very fast. This information is not intended to replace advice given to you by your health care provider. Make sure you discuss any questions you have with your health care provider. Document Released: 10/10/2010 Document Revised: 09/19/2015 Document Reviewed: 06/20/2014 Elsevier Interactive Patient Education  [employer]2018 [employer]

## 2018-03-23 ENCOUNTER — Ambulatory Visit: Payer: Self-pay

## 2018-03-23 DIAGNOSIS — Z13220 Encounter for screening for lipoid disorders: Secondary | ICD-10-CM | POA: Diagnosis not present

## 2018-03-23 DIAGNOSIS — J019 Acute sinusitis, unspecified: Secondary | ICD-10-CM | POA: Diagnosis not present

## 2018-03-23 DIAGNOSIS — J209 Acute bronchitis, unspecified: Secondary | ICD-10-CM | POA: Diagnosis not present

## 2018-03-23 DIAGNOSIS — R0981 Nasal congestion: Secondary | ICD-10-CM | POA: Diagnosis not present

## 2018-03-23 DIAGNOSIS — R070 Pain in throat: Secondary | ICD-10-CM | POA: Diagnosis not present

## 2018-03-23 DIAGNOSIS — R05 Cough: Secondary | ICD-10-CM | POA: Diagnosis not present

## 2018-03-23 DIAGNOSIS — R0982 Postnasal drip: Secondary | ICD-10-CM | POA: Diagnosis not present

## 2018-03-23 DIAGNOSIS — R51 Headache: Secondary | ICD-10-CM | POA: Diagnosis not present

## 2018-03-23 DIAGNOSIS — R509 Fever, unspecified: Secondary | ICD-10-CM | POA: Diagnosis not present

## 2018-03-23 NOTE — Telephone Encounter (Signed)
Pt c/o cough since August. Pt stated she has been to the office for evaluation twice.  Pt was seen in August for URI with cough and congestion then again 03/16/18 for persistent cough. Pt has tried humidifier, Alka Seltzer cold and flu,Mucinex, Theraflu at night, Vicks 44 and Flonase. Pt sounded very congested and frequent cough. Pt's cough is productive and is coughing up yellow phlegm. Pt stated that she is having yellow discharge from her nose.  Pt is requesting an antibiotic to be called in. Pt stated that she cannot come in for an office visit stating she has other appointments today. Pt advised that pt may be required to come in for appointment before antibiotics are prescribed.  Care advice given and pt verbalized understanding.  Reason for Disposition . [1] Continuous (nonstop) coughing interferes with work or school AND [2] no improvement using cough treatment per Care Advice  Answer Assessment - Initial Assessment Questions 1. ONSET: "When did the cough begin?"  August  2. SEVERITY: "How bad is the cough today?"      Worse when pt talks and  Preventing pt with sleeping  3. RESPIRATORY DISTRESS: "Describe your breathing."      Denies Shortness of breath and wheezing 4. FEVER: "Do you have a fever?" If so, ask: "What is your temperature, how was it measured, and when did it start?"     No but having aches and pains 5. SPUTUM: "Describe the color of your sputum" (clear, white, yellow, green) Light yellow  And having light yellow from nose 6. HEMOPTYSIS: "Are you coughing up any blood?" If so ask: "How much?" (flecks, streaks, tablespoons, etc.)     no 7. CARDIAC HISTORY: "Do you have any history of heart disease?" (e.g., heart attack, congestive heart failure)      no 8. LUNG HISTORY: "Do you have any history of lung disease?"  (e.g., pulmonary embolus, asthma, emphysema)     Asthma  9. PE RISK FACTORS: "Do you have a history of blood clots?" (or: recent major surgery, recent prolonged  travel, bedridden)     no 10. OTHER SYMPTOMS: "Do you have any other symptoms?" (e.g., runny nose, wheezing, chest pain)       Runny nose, head congestion, sore throat 11. PREGNANCY: "Is there any chance you are pregnant?" "When was your last menstrual period?"       No LMP: 02/20/18 12. TRAVEL: "Have you traveled out of the country in the last month?" (e.g., travel history, exposures)       no  Protocols used: COUGH - ACUTE PRODUCTIVE-A-AH

## 2018-03-23 NOTE — Telephone Encounter (Signed)
Message from Brenda Molina sent at 03/23/2018 1:11 PM EST   Summary: Rx request   Pt stated she no longer needs antibiotic/ Rx request filled by another provider

## 2018-03-29 DIAGNOSIS — Z6834 Body mass index (BMI) 34.0-34.9, adult: Secondary | ICD-10-CM | POA: Diagnosis not present

## 2018-03-29 DIAGNOSIS — E559 Vitamin D deficiency, unspecified: Secondary | ICD-10-CM | POA: Diagnosis not present

## 2018-03-29 DIAGNOSIS — R635 Abnormal weight gain: Secondary | ICD-10-CM | POA: Diagnosis not present

## 2018-03-29 DIAGNOSIS — Z713 Dietary counseling and surveillance: Secondary | ICD-10-CM | POA: Diagnosis not present

## 2018-03-29 DIAGNOSIS — E669 Obesity, unspecified: Secondary | ICD-10-CM | POA: Diagnosis not present

## 2018-03-29 DIAGNOSIS — E785 Hyperlipidemia, unspecified: Secondary | ICD-10-CM | POA: Diagnosis not present

## 2018-04-01 DIAGNOSIS — R69 Illness, unspecified: Secondary | ICD-10-CM | POA: Diagnosis not present

## 2018-04-18 ENCOUNTER — Ambulatory Visit (INDEPENDENT_AMBULATORY_CARE_PROVIDER_SITE_OTHER): Payer: 59 | Admitting: Allergy and Immunology

## 2018-04-18 ENCOUNTER — Encounter: Payer: Self-pay | Admitting: Allergy and Immunology

## 2018-04-18 VITALS — BP 140/90 | HR 95 | Resp 16 | Ht 63.0 in | Wt 194.4 lb

## 2018-04-18 DIAGNOSIS — R053 Chronic cough: Secondary | ICD-10-CM | POA: Insufficient documentation

## 2018-04-18 DIAGNOSIS — R05 Cough: Secondary | ICD-10-CM

## 2018-04-18 DIAGNOSIS — Z87898 Personal history of other specified conditions: Secondary | ICD-10-CM

## 2018-04-18 DIAGNOSIS — J3089 Other allergic rhinitis: Secondary | ICD-10-CM

## 2018-04-18 MED ORDER — ALBUTEROL SULFATE HFA 108 (90 BASE) MCG/ACT IN AERS: INHALATION_SPRAY | RESPIRATORY_TRACT | 1 refills | Status: DC

## 2018-04-18 MED ORDER — AZELASTINE-FLUTICASONE 137-50 MCG/ACT NA SUSP: NASAL | 5 refills | Status: DC

## 2018-04-18 MED ORDER — HYDROCOD POLST-CPM POLST ER 10-8 MG/5ML PO SUER: 5.0000 mL | Freq: Two times a day (BID) | ORAL | 0 refills | Status: DC | PRN

## 2018-04-18 NOTE — Assessment & Plan Note (Signed)
The most common causes of chronic cough include the following: upper airway cough syndrome (UACS) which is caused by variety of rhinosinus conditions; asthma; gastroesophageal reflux disease (GERD); chronic bronchitis from cigarette smoking or other inhaled environmental irritants; non-asthmatic eosinophilic bronchitis; and bronchiectasis. In prospective studies, these conditions have accounted for up to 94% of the causes of chronic cough in immunocompetent adults. The history and physical examination suggest that her cough is multifactorial with contribution from postnasal drainage and acid reflux. We will address these issues at this time.   A prescription has been provided for a flutter valve to be used as needed to break the coughing cycle.  Prednisone has been provided, 40 mg x3 days, 20 mg x1 day, 10 mg x1 day, then stop.  A prescription has been provided for Tussionex ER suspension, 5 mL every 12 hours over the next 3 days, then stop.  A prescription has been provided for omeprazole 20 mg twice daily, 30 minutes prior to meals.  Appropriate reflux lifestyle modifications have been provided.  Treatment plan as outlined below.    We will regroup in 8 weeks to assess treatment response and adjust therapy accordingly.

## 2018-04-18 NOTE — Assessment & Plan Note (Signed)
   Aeroallergen avoidance measures have been discussed and provided in written form.  A prescription has been provided for carbinoxamine 4 mg every 6-8 hours if needed.  A prescription has been provided for Dymista (azelastine/fluticasone) nasal spray, 1 spray per nostril twice daily as needed. Proper nasal spray technique has been discussed and demonstrated.  Nasal saline lavage (NeilMed) has been recommended as needed and prior to medicated nasal sprays along with instructions for proper administration.  For thick post nasal drainage, add guaifenesin 1200 mg (Mucinex Maximum Strength)  twice daily as needed with adequate hydration as discussed.  If allergen avoidance measures and medications fail to adequately relieve symptoms, aeroallergen immunotherapy will be considered.

## 2018-04-18 NOTE — Patient Instructions (Addendum)
Perennial and seasonal allergic rhinitis  Aeroallergen avoidance measures have been discussed and provided in written form.  A prescription has been provided for carbinoxamine 4 mg every 6-8 hours if needed.  A prescription has been provided for Dymista (azelastine/fluticasone) nasal spray, 1 spray per nostril twice daily as needed. Proper nasal spray technique has been discussed and demonstrated.  Nasal saline lavage (NeilMed) has been recommended as needed and prior to medicated nasal sprays along with instructions for proper administration.  For thick post nasal drainage, add guaifenesin 1200 mg (Mucinex Maximum Strength)  twice daily as needed with adequate hydration as discussed.  If allergen avoidance measures and medications fail to adequately relieve symptoms, aeroallergen immunotherapy will be considered.  Cough, persistent The most common causes of chronic cough include the following: upper airway cough syndrome (UACS) which is caused by variety of rhinosinus conditions; asthma; gastroesophageal reflux disease (GERD); chronic bronchitis from cigarette smoking or other inhaled environmental irritants; non-asthmatic eosinophilic bronchitis; and bronchiectasis. In prospective studies, these conditions have accounted for up to 94% of the causes of chronic cough in immunocompetent adults. The history and physical examination suggest that her cough is multifactorial with contribution from postnasal drainage and acid reflux. We will address these issues at this time.   A prescription has been provided for a flutter valve to be used as needed to break the coughing cycle.  Prednisone has been provided, 40 mg x3 days, 20 mg x1 day, 10 mg x1 day, then stop.  A prescription has been provided for Tussionex ER suspension, 5 mL every 12 hours over the next 3 days, then stop.  A prescription has been provided for omeprazole 20 mg twice daily, 30 minutes prior to meals.  Appropriate reflux  lifestyle modifications have been provided.  Treatment plan as outlined below.    We will regroup in 8 weeks to assess treatment response and adjust therapy accordingly.  History of wheezing  A prescription has been provided for albuterol HFA, 1 to 2 inhalations every 4-6 hours as needed.  Subjective and objective measures of pulmonary function will be followed and the treatment plan will be adjusted accordingly.   Return in about 8 weeks (around 06/13/2018), or if symptoms worsen or fail to improve.  Control of Dust Mite Allergen  House dust mites play a major role in allergic asthma and rhinitis.  They occur in environments with high humidity wherever human skin, the food for dust mites is found. High levels have been detected in dust obtained from mattresses, pillows, carpets, upholstered furniture, bed covers, clothes and soft toys.  The principal allergen of the house dust mite is found in its feces.  A gram of dust may contain 1,000 mites and 250,000 fecal particles.  Mite antigen is easily measured in the air during house cleaning activities.    1. Encase mattresses, including the box spring, and pillow, in an air tight cover.  Seal the zipper end of the encased mattresses with wide adhesive tape. 2. Wash the bedding in water of 130 degrees Farenheit weekly.  Avoid cotton comforters/quilts and flannel bedding: the most ideal bed covering is the dacron comforter. 3. Remove all upholstered furniture from the bedroom. 4. Remove carpets, carpet padding, rugs, and non-washable window drapes from the bedroom.  Wash drapes weekly or use plastic window coverings. 5. Remove all non-washable stuffed toys from the bedroom.  Wash stuffed toys weekly. 6. Have the room cleaned frequently with a vacuum cleaner and a damp dust-mop.  The patient should  not be in a room which is being cleaned and should wait 1 hour after cleaning before going into the room. 7. Close and seal all heating outlets in the  bedroom.  Otherwise, the room will become filled with dust-laden air.  An electric heater can be used to heat the room. Reduce indoor humidity to less than 50%.  Do not use a humidifier.   Reducing Pollen Exposure  The American Academy of Allergy, Asthma and Immunology suggests the following steps to reduce your exposure to pollen during allergy seasons.    1. Do not hang sheets or clothing out to dry; pollen may collect on these items. 2. Do not mow lawns or spend time around freshly cut grass; mowing stirs up pollen. 3. Keep windows closed at night.  Keep car windows closed while driving. 4. Minimize morning activities outdoors, a time when pollen counts are usually at their highest. 5. Stay indoors as much as possible when pollen counts or humidity is high and on windy days when pollen tends to remain in the air longer. 6. Use air conditioning when possible.  Many air conditioners have filters that trap the pollen spores. 7. Use a HEPA room air filter to remove pollen form the indoor air you breathe.   Control of Dog or Cat Allergen  Avoidance is the best way to manage a dog or cat allergy. If you have a dog or cat and are allergic to dog or cats, consider removing the dog or cat from the home. If you have a dog or cat but don't want to find it a new home, or if your family wants a pet even though someone in the household is allergic, here are some strategies that may help keep symptoms at bay:  1. Keep the pet out of your bedroom and restrict it to only a few rooms. Be advised that keeping the dog or cat in only one room will not limit the allergens to that room. 2. Don't pet, hug or kiss the dog or cat; if you do, wash your hands with soap and water. 3. High-efficiency particulate air (HEPA) cleaners run continuously in a bedroom or living room can reduce allergen levels over time. 4. Place electrostatic material sheet in the air inlet vent in the bedroom. 5. Regular use of a  high-efficiency vacuum cleaner or a central vacuum can reduce allergen levels. 6. Giving your dog or cat a bath at least once a week can reduce airborne allergen.   Control of Mold Allergen  Mold and fungi can grow on a variety of surfaces provided certain temperature and moisture conditions exist.  Outdoor molds grow on plants, decaying vegetation and soil.  The major outdoor mold, Alternaria and Cladosporium, are found in very high numbers during hot and dry conditions.  Generally, a late Summer - Fall peak is seen for common outdoor fungal spores.  Rain will temporarily lower outdoor mold spore count, but counts rise rapidly when the rainy period ends.  The most important indoor molds are Aspergillus and Penicillium.  Dark, humid and poorly ventilated basements are ideal sites for mold growth.  The next most common sites of mold growth are the bathroom and the kitchen.  Outdoor MicrosoftMold Control 1. Use air conditioning and keep windows closed 2. Avoid exposure to decaying vegetation. 3. Avoid leaf raking. 4. Avoid grain handling. 5. Consider wearing a face mask if working in moldy areas.  Indoor Mold Control 1. Maintain humidity below 50%. 2. Clean washable surfaces  with 5% bleach solution. 3. Remove sources e.g. Contaminated carpets.   Control of Cockroach Allergen  Cockroach allergen has been identified as an important cause of acute attacks of asthma, especially in urban settings.  There are fifty-five species of cockroach that exist in the [place], however only three, the [place], [place] species produce allergen that can affect patients with Asthma.  Allergens can be obtained from fecal particles, egg casings and secretions from cockroaches.    1. Remove food sources. 2. Reduce access to water. 3. Seal access and entry points. 4. Spray runways with 0.5-1% Diazinon or Chlorpyrifos 5. Blow boric acid power under stoves and refrigerator. 6. Place bait stations  (hydramethylnon) at feeding sites.  Lifestyle Changes for Controlling GERD  When you have GERD, stomach acid feels as if it's backing up toward your mouth. Whether or not you take medication to control your GERD, your symptoms can often be improved with lifestyle changes.   Raise Your Head  Reflux is more likely to strike when you're lying down flat, because stomach fluid can  flow backward more easily. Raising the head of your bed 4-6 inches can help. To do this:  Slide blocks or books under the legs at the head of your bed. Or, place a wedge under  the mattress. Many foam stores can make a suitable wedge for you. The wedge  should run from your waist to the top of your head.  Don't just prop your head on several pillows. This increases pressure on your  stomach. It can make GERD worse.  Watch Your Eating Habits Certain foods may increase the acid in your stomach or relax the lower esophageal sphincter, making GERD more likely. It's best to avoid the following:  Coffee, tea, and carbonated drinks (with and without caffeine)  Fatty, fried, or spicy food  Mint, chocolate, onions, and tomatoes  Any other foods that seem to irritate your stomach or cause you pain  Relieve the Pressure  Eat smaller meals, even if you have to eat more often.  Don't lie down right after you eat. Wait a few hours for your stomach to empty.  Avoid tight belts and tight-fitting clothes.  Lose excess weight.  Tobacco and Alcohol  Avoid smoking tobacco and drinking alcohol. They can make GERD symptoms worse.

## 2018-04-18 NOTE — Assessment & Plan Note (Signed)
   A prescription has been provided for albuterol HFA, 1 to 2 inhalations every 4-6 hours as needed.  Subjective and objective measures of pulmonary function will be followed and the treatment plan will be adjusted accordingly.

## 2018-04-18 NOTE — Progress Notes (Signed)
New Patient Note  RE: Rush BarerShavonda D Molina MRN: [id number] DOB: Feb 19, 1986 Date of Office Visit: 04/18/2018  Referring provider: No ref. provider found Primary care provider: Patient, No Pcp Per  Chief Complaint: Nasal Congestion; Sinus Problem; and Cough   History of present illness: Brenda BowensShavonda Molina is a 32 y.o. female presenting today for evaluation of rhinosinusitis and persistent cough. She reports that since August 2019 she has experienced nasal congestion, frequent sinus pressure, thick postnasal drainage, and coughing.  The cough is described as persistent, nonproductive, and worse at nighttime.  She states that she had a similar issue involving rhinosinusitis and cough several years ago which was treated successfully with antibiotics.  She admits to occasional chest tightness, dyspnea, and/or wheezing, typically associated with vigorous exercise.  In addition, she experienced wheezing and coughing 2 years ago and was diagnosed with bronchitis at that time.  She experiences occasional heartburn which she attempts to control with Tums as needed.  Assessment and plan: Perennial and seasonal allergic rhinitis  Aeroallergen avoidance measures have been discussed and provided in written form.  A prescription has been provided for carbinoxamine 4 mg every 6-8 hours if needed.  A prescription has been provided for Dymista (azelastine/fluticasone) nasal spray, 1 spray per nostril twice daily as needed. Proper nasal spray technique has been discussed and demonstrated.  Nasal saline lavage (NeilMed) has been recommended as needed and prior to medicated nasal sprays along with instructions for proper administration.  For thick post nasal drainage, add guaifenesin 1200 mg (Mucinex Maximum Strength)  twice daily as needed with adequate hydration as discussed.  If allergen avoidance measures and medications fail to adequately relieve symptoms, aeroallergen immunotherapy will be  considered.  Cough, persistent The most common causes of chronic cough include the following: upper airway cough syndrome (UACS) which is caused by variety of rhinosinus conditions; asthma; gastroesophageal reflux disease (GERD); chronic bronchitis from cigarette smoking or other inhaled environmental irritants; non-asthmatic eosinophilic bronchitis; and bronchiectasis. In prospective studies, these conditions have accounted for up to 94% of the causes of chronic cough in immunocompetent adults. The history and physical examination suggest that her cough is multifactorial with contribution from postnasal drainage and acid reflux. We will address these issues at this time.   A prescription has been provided for a flutter valve to be used as needed to break the coughing cycle.  Prednisone has been provided, 40 mg x3 days, 20 mg x1 day, 10 mg x1 day, then stop.  A prescription has been provided for Tussionex ER suspension, 5 mL every 12 hours over the next 3 days, then stop.  A prescription has been provided for omeprazole 20 mg twice daily, 30 minutes prior to meals.  Appropriate reflux lifestyle modifications have been provided.  Treatment plan as outlined below.    We will regroup in 8 weeks to assess treatment response and adjust therapy accordingly.  History of wheezing  A prescription has been provided for albuterol HFA, 1 to 2 inhalations every 4-6 hours as needed.  Subjective and objective measures of pulmonary function will be followed and the treatment plan will be adjusted accordingly.   Meds ordered this encounter  Medications  . chlorpheniramine-HYDROcodone (TUSSIONEX PENNKINETIC ER) 10-8 MG/5ML SUER    Sig: Take 5 mLs by mouth every 12 (twelve) hours as needed for cough. For the next 3 days    Dispense:  70 mL    Refill:  0  . Azelastine-Fluticasone (DYMISTA) 137-50 MCG/ACT SUSP    Sig: Use  1 spray per nostril twice daily as needed    Dispense:  1 Bottle    Refill:  5   . albuterol (PROVENTIL HFA;VENTOLIN HFA) 108 (90 Base) MCG/ACT inhaler    Sig: May use 1-2 spray every 4-6 hours as needed for cough,wheeze and shortness of breathe    Dispense:  18 g    Refill:  1    Diagnostics: Spirometry: FVC was 3.28 L and FEV1 was 2.46 L (94% predicted) without significant postbronchodilator improvement.  This study was performed while the patient was asymptomatic.  Please see scanned spirometry results for details. Allergy skin testing: Positive to grass pollen, weed pollen, ragweed pollen, tree pollen, molds, cat hair, cockroach antigen, and dust mite antigen.    Physical examination: Blood pressure 140/90, pulse 95, resp. rate 16, height 5\' 3"  (1.6 m), weight 194 lb 6.4 oz (88.2 kg), SpO2 98 %.  General: Alert, interactive, in no acute distress. HEENT: TMs pearly gray, turbinates edematous with thick discharge, post-pharynx erythematous. Neck: Supple without lymphadenopathy. Lungs: Clear to auscultation without wheezing, rhonchi or rales. CV: Normal S1, S2 without murmurs. Abdomen: Nondistended, nontender. Skin: Warm and dry, without lesions or rashes. Extremities:  No clubbing, cyanosis or edema. Neuro:   Grossly intact.  Review of systems:  Review of systems negative except as noted in HPI / PMHx or noted below: Review of Systems  Constitutional: Negative.   HENT: Negative.   Eyes: Negative.   Respiratory: Negative.   Cardiovascular: Negative.   Gastrointestinal: Negative.   Genitourinary: Negative.   Musculoskeletal: Negative.   Skin: Negative.   Neurological: Negative.   Endo/Heme/Allergies: Negative.   Psychiatric/Behavioral: Negative.     Past medical history:  Past Medical History:  Diagnosis Date  . Allergy   . Anxiety   . PCOS (polycystic ovarian syndrome)     Past surgical history:  Past Surgical History:  Procedure Laterality Date  . WISDOM TOOTH EXTRACTION      Family history: Family History  Problem Relation Age of  Onset  . Asthma Father   . Hypertension Father     Social history: Social History   Socioeconomic History  . Marital status: Single    Spouse name: Not on file  . Number of children: 1  . Years of education: Not on file  . Highest education level: Not on file  Occupational History  . Not on file  Social Needs  . Financial resource strain: Not on file  . Food insecurity:    Worry: Not on file    Inability: Not on file  . Transportation needs:    Medical: Not on file    Non-medical: Not on file  Tobacco Use  . Smoking status: Never Smoker  . Smokeless tobacco: Never Used  Substance and Sexual Activity  . Alcohol use: Yes    Comment: occ  . Drug use: No  . Sexual activity: Yes    Partners: Male    Birth control/protection: Condom  Lifestyle  . Physical activity:    Days per week: Not on file    Minutes per session: Not on file  . Stress: Not on file  Relationships  . Social connections:    Talks on phone: Not on file    Gets together: Not on file    Attends religious service: Not on file    Active member of club or organization: Not on file    Attends meetings of clubs or organizations: Not on file    Relationship status:  Not on file  . Intimate partner violence:    Fear of current or ex partner: Not on file    Emotionally abused: Not on file    Physically abused: Not on file    Forced sexual activity: Not on file  Other Topics Concern  . 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  She is a non-smoker without pets.  There is no known mold/water damage in the home.  Allergies as of 04/18/2018   No Known Allergies     Medication List       Accurate as of April 18, 2018  5:37 PM. Always use  your most recent med list.        albuterol 108 (90 Base) MCG/ACT inhaler Commonly known as:  PROVENTIL HFA;VENTOLIN HFA Inhale 2 puffs into the lungs every 6 (six) hours as needed (for cough).   albuterol 108 (90 Base) MCG/ACT inhaler Commonly known as:  PROVENTIL HFA;VENTOLIN HFA May use 1-2 spray every 4-6 hours as needed for cough,wheeze and shortness of breathe   Azelastine-Fluticasone 137-50 MCG/ACT Susp Commonly known as:  DYMISTA Use 1 spray per nostril twice daily as needed   cetirizine 10 MG tablet Commonly known as:  ZYRTEC Take 1 tablet (10 mg total) by mouth daily.   chlorpheniramine-HYDROcodone 10-8 MG/5ML Suer Commonly known as:  TUSSIONEX PENNKINETIC ER Take 5 mLs by mouth every 12 (twelve) hours as needed for cough. For the next 3 days   clotrimazole-betamethasone cream Commonly known as:  LOTRISONE Apply 1 application topically 2 (two) times daily.   fluticasone 50 MCG/ACT nasal spray Commonly known as:  FLONASE Place 2 sprays into both nostrils daily.   omeprazole 20 MG capsule Commonly known as:  PRILOSEC Take 1 capsule (20 mg total) by mouth daily.       Known medication allergies: No Known Allergies  I appreciate the opportunity to take part in Twin Lake[place]avonda's care. Please do not hesitate to contact me with questions.  Sincerely,   R. Jorene Guestarter Mohannad Olivero, MD

## 2018-04-21 DIAGNOSIS — R69 Illness, unspecified: Secondary | ICD-10-CM | POA: Diagnosis not present

## 2018-05-13 DIAGNOSIS — R69 Illness, unspecified: Secondary | ICD-10-CM | POA: Diagnosis not present

## 2018-05-24 DIAGNOSIS — R69 Illness, unspecified: Secondary | ICD-10-CM | POA: Diagnosis not present

## 2018-06-06 DIAGNOSIS — E669 Obesity, unspecified: Secondary | ICD-10-CM | POA: Diagnosis not present

## 2018-06-06 DIAGNOSIS — Z6833 Body mass index (BMI) 33.0-33.9, adult: Secondary | ICD-10-CM | POA: Diagnosis not present

## 2018-06-06 DIAGNOSIS — Z713 Dietary counseling and surveillance: Secondary | ICD-10-CM | POA: Diagnosis not present

## 2018-06-06 DIAGNOSIS — R635 Abnormal weight gain: Secondary | ICD-10-CM | POA: Diagnosis not present

## 2018-06-08 DIAGNOSIS — R69 Illness, unspecified: Secondary | ICD-10-CM | POA: Diagnosis not present

## 2018-06-27 ENCOUNTER — Ambulatory Visit: Payer: 59 | Admitting: Allergy and Immunology

## 2018-06-27 ENCOUNTER — Encounter: Payer: Self-pay | Admitting: Allergy and Immunology

## 2018-06-27 DIAGNOSIS — R05 Cough: Secondary | ICD-10-CM

## 2018-06-27 DIAGNOSIS — J3089 Other allergic rhinitis: Secondary | ICD-10-CM

## 2018-06-27 DIAGNOSIS — R053 Chronic cough: Secondary | ICD-10-CM

## 2018-06-27 NOTE — Assessment & Plan Note (Signed)
   Continue appropriate allergen avoidance measures, cetirizine 10 mg daily as needed, and/or azelastine/fluticasone nasal spray as needed.  Nasal saline spray (i.e., Simply Saline) or nasal saline lavage (i.e., NeilMed) is recommended as needed and prior to medicated nasal sprays.  If allergen avoidance measures and medications fail to adequately relieve symptoms, aeroallergen immunotherapy will be considered.

## 2018-06-27 NOTE — Patient Instructions (Signed)
Perennial and seasonal allergic rhinitis  Continue appropriate allergen avoidance measures, cetirizine 10 mg daily as needed, and/or azelastine/fluticasone nasal spray as needed.  Nasal saline spray (i.e., Simply Saline) or nasal saline lavage (i.e., NeilMed) is recommended as needed and prior to medicated nasal sprays.  If allergen avoidance measures and medications fail to adequately relieve symptoms, aeroallergen immunotherapy will be considered.  Cough, persistent Currently quiescent.   Return in about 1 year (around 06/27/2019), or if symptoms worsen or fail to improve.

## 2018-06-27 NOTE — Progress Notes (Signed)
    Follow-up Note  RE: Brenda Molina Molina MRN: [id number] DOB: [birth year] Date of Office Visit: 06/27/2018  Primary care provider: Shade Flood, MD Referring provider: No ref. provider found  History of present illness: Brenda Molina Molina is a 33 y.o. female with allergic rhinoconjunctivitis and history of coughing/wheezing presenting today for follow-up.  She was previously seen in this clinic for her initial evaluation on April 18, 2018.  She reports that her upper and lower respiratory symptoms have improved in the interval since her previous visit.  Her nasal allergy symptoms are well controlled with occasional cetirizine and/or azelastine/fluticasone nasal spray.  She denies coughing and/or wheezing in the interval since her previous visit.  Assessment and plan: Perennial and seasonal allergic rhinitis  Continue appropriate allergen avoidance measures, cetirizine 10 mg daily as needed, and/or azelastine/fluticasone nasal spray as needed.  Nasal saline spray (i.e., Simply Saline) or nasal saline lavage (i.e., NeilMed) is recommended as needed and prior to medicated nasal sprays.  If allergen avoidance measures and medications fail to adequately relieve symptoms, aeroallergen immunotherapy will be considered.  Cough, persistent Currently quiescent.   Physical examination: Blood pressure 120/80, pulse 85, resp. rate 16, SpO2 98 %.  General: Alert, interactive, in no acute distress. HEENT: TMs pearly gray, turbinates moderately edematous with thick discharge, post-pharynx mildly erythematous. Neck: Supple without lymphadenopathy. Lungs: Clear to auscultation without wheezing, rhonchi or rales. CV: Normal S1, S2 without murmurs. Skin: Warm and dry, without lesions or rashes.  The following portions of the patient's history were reviewed and updated as appropriate: allergies, current medications, past family history, past medical history, past social history, past surgical history  and problem list.  Allergies as of 06/27/2018   No Known Allergies     Medication List       Accurate as of June 27, 2018 11:54 AM. Always use your most recent med list.        albuterol 108 (90 Base) MCG/ACT inhaler Commonly known as:  PROVENTIL HFA;VENTOLIN HFA Inhale 2 puffs into the lungs every 6 (six) hours as needed (for cough).   albuterol 108 (90 Base) MCG/ACT inhaler Commonly known as:  PROVENTIL HFA;VENTOLIN HFA May use 1-2 spray every 4-6 hours as needed for cough,wheeze and shortness of breathe   Azelastine-Fluticasone 137-50 MCG/ACT Susp Commonly known as:  DYMISTA Use 1 spray per nostril twice daily as needed   cetirizine 10 MG tablet Commonly known as:  ZYRTEC Take 1 tablet (10 mg total) by mouth daily.   clotrimazole-betamethasone cream Commonly known as:  LOTRISONE Apply 1 application topically 2 (two) times daily.   fluticasone 50 MCG/ACT nasal spray Commonly known as:  FLONASE Place 2 sprays into both nostrils daily.   omeprazole 20 MG capsule Commonly known as:  PRILOSEC Take 1 capsule (20 mg total) by mouth daily.       No Known Allergies  I appreciate the opportunity to take part in Brenda Molina care. Please do not hesitate to contact me with questions.  Sincerely,   R. Jorene Guest, MD

## 2018-06-27 NOTE — Assessment & Plan Note (Signed)
Currently quiescent

## 2018-07-08 DIAGNOSIS — R69 Illness, unspecified: Secondary | ICD-10-CM | POA: Diagnosis not present

## 2018-08-03 DIAGNOSIS — R69 Illness, unspecified: Secondary | ICD-10-CM | POA: Diagnosis not present

## 2018-10-07 DIAGNOSIS — R69 Illness, unspecified: Secondary | ICD-10-CM | POA: Diagnosis not present

## 2018-10-19 ENCOUNTER — Other Ambulatory Visit: Payer: Self-pay | Admitting: *Deleted

## 2018-10-19 DIAGNOSIS — Z20822 Contact with and (suspected) exposure to covid-19: Secondary | ICD-10-CM

## 2018-10-19 DIAGNOSIS — H5213 Myopia, bilateral: Secondary | ICD-10-CM | POA: Diagnosis not present

## 2018-10-19 DIAGNOSIS — R6889 Other general symptoms and signs: Secondary | ICD-10-CM | POA: Diagnosis not present

## 2018-10-19 DIAGNOSIS — Z0100 Encounter for examination of eyes and vision without abnormal findings: Secondary | ICD-10-CM | POA: Diagnosis not present

## 2018-10-23 LAB — NOVEL CORONAVIRUS, NAA: SARS-CoV-2, NAA: NOT DETECTED

## 2018-11-02 DIAGNOSIS — R69 Illness, unspecified: Secondary | ICD-10-CM | POA: Diagnosis not present

## 2018-11-17 DIAGNOSIS — R69 Illness, unspecified: Secondary | ICD-10-CM | POA: Diagnosis not present

## 2018-11-17 DIAGNOSIS — Z124 Encounter for screening for malignant neoplasm of cervix: Secondary | ICD-10-CM | POA: Diagnosis not present

## 2018-11-17 DIAGNOSIS — Z113 Encounter for screening for infections with a predominantly sexual mode of transmission: Secondary | ICD-10-CM | POA: Diagnosis not present

## 2018-11-17 DIAGNOSIS — Z01419 Encounter for gynecological examination (general) (routine) without abnormal findings: Secondary | ICD-10-CM | POA: Diagnosis not present

## 2018-11-17 DIAGNOSIS — Z6837 Body mass index (BMI) 37.0-37.9, adult: Secondary | ICD-10-CM | POA: Diagnosis not present

## 2018-11-17 DIAGNOSIS — Z3040 Encounter for surveillance of contraceptives, unspecified: Secondary | ICD-10-CM | POA: Diagnosis not present

## 2018-11-18 DIAGNOSIS — Z0100 Encounter for examination of eyes and vision without abnormal findings: Secondary | ICD-10-CM | POA: Diagnosis not present

## 2018-11-30 DIAGNOSIS — R69 Illness, unspecified: Secondary | ICD-10-CM | POA: Diagnosis not present

## 2019-01-30 ENCOUNTER — Telehealth (INDEPENDENT_AMBULATORY_CARE_PROVIDER_SITE_OTHER): Payer: 59 | Admitting: Family Medicine

## 2019-01-30 ENCOUNTER — Encounter: Payer: Self-pay | Admitting: Family Medicine

## 2019-01-30 ENCOUNTER — Other Ambulatory Visit: Payer: Self-pay

## 2019-01-30 DIAGNOSIS — F32A Depression, unspecified: Secondary | ICD-10-CM

## 2019-01-30 DIAGNOSIS — F329 Major depressive disorder, single episode, unspecified: Secondary | ICD-10-CM

## 2019-01-30 DIAGNOSIS — G4700 Insomnia, unspecified: Secondary | ICD-10-CM | POA: Diagnosis not present

## 2019-01-30 DIAGNOSIS — F418 Other specified anxiety disorders: Secondary | ICD-10-CM | POA: Diagnosis not present

## 2019-01-30 DIAGNOSIS — R69 Illness, unspecified: Secondary | ICD-10-CM | POA: Diagnosis not present

## 2019-01-30 MED ORDER — SERTRALINE HCL 100 MG PO TABS: 100.0000 mg | ORAL_TABLET | Freq: Every day | ORAL | 1 refills | Status: DC

## 2019-01-30 MED ORDER — HYDROXYZINE HCL 10 MG PO TABS: 5.0000 mg | ORAL_TABLET | Freq: Three times a day (TID) | ORAL | 0 refills | Status: DC | PRN

## 2019-01-30 NOTE — Progress Notes (Signed)
Virtual Visit via Telephone Note  I connected with Brenda Molina on 01/30/19 at 6:30 PM by telephone and verified that I am speaking with the correct person using two identifiers.   I discussed the limitations, risks, security and privacy concerns of performing an evaluation and management service by telephone and the availability of in person appointments. I also discussed with the patient that there may be a patient responsible charge related to this service. The patient expressed understanding and agreed to proceed, consent obtained  Chief complaint: Depression and anxiety  History of Present Illness: Brenda Molina is a 33 y.o. female  Depression: Previous history of depression and has been treated with Zoloft, discussed with Dr. Creta Levin in November 2019, controlled at that time.  Past few months anxiety/depression symptoms have been getting worse past few months, especially last month.  Family in [place].  Coworkers noticing she is more depressed.  feeling overwhelmed.  3yo daughter at home. Had been meeting with therapist, but do not meet with them last month (usually 2-3 week intervals, then spaced out visits). Last appt in August.  Last took sertraline in April - Would like to start back on sertraline. Was doing ok on sertraline.  Appt with therapist in 1 week.  Put in for a leave of absence for work today - not sure if it will be approved. Trouble sleeping.  Lives by self at home with her child, denies any support structure here locally, family is in [place].  Denies suicidal thoughts, denies homicidal thoughts, including no thoughts of hurting herself or others.    PHQ 9 score of 26 today with a gad 7 score of 21.   Depression screen [facility] 2/9 03/16/2018 12/29/2017 12/09/2017 12/08/2016 07/05/2015  Decreased Interest 0 2 3 0 0  Down, Depressed, Hopeless 0 2 3 0 0  PHQ - 2 Score 0 4 6 0 0  Altered sleeping - 2 3 - -  Tired, decreased energy - 2 3 - -  Change in  appetite - 2 3 - -  Feeling bad or failure about yourself  - 2 3 - -  Trouble concentrating - 2 3 - -  Moving slowly or fidgety/restless - 2 3 - -  Suicidal thoughts - 0 0 - -  PHQ-9 Score - 16 24 - -  Difficult doing work/chores - Somewhat difficult Extremely dIfficult - -      Patient Active Problem List   Diagnosis Date Noted  . Perennial and seasonal allergic rhinitis 04/18/2018  . Cough, persistent 04/18/2018  . History of wheezing 04/18/2018  . 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 Normal vaginal delivery 06/06/2015  . RhD negative 06/05/2015  . Preterm premature rupture of membranes 06/05/2015   Past Medical History:  Diagnosis Date  . Allergy   . Anxiety   . PCOS (polycystic ovarian syndrome)    Past Surgical History:  Procedure Laterality Date  . WISDOM TOOTH EXTRACTION     No Known Allergies Prior to Admission medications   Medication Sig Start Date End Date Taking? Authorizing Provider  albuterol (PROVENTIL HFA;VENTOLIN HFA) 108 (90 Base) MCG/ACT inhaler Inhale 2 puffs into the lungs every 6 (six) hours as needed (for cough). 03/16/18  Yes Doristine Bosworth, MD  albuterol (PROVENTIL HFA;VENTOLIN HFA) 108 (90 Base) MCG/ACT inhaler May use 1-2 spray every 4-6 hours as needed for cough,wheeze and shortness of breathe 04/18/18  Yes Bobbitt, Heywood Iles, MD  Azelastine-Fluticasone [facility]) 137-50 MCG/ACT SUSP Use 1 spray per nostril  twice daily as needed 04/18/18  Yes Bobbitt, Heywood Ilesalph Carter, MD  clotrimazole-betamethasone (LOTRISONE) cream Apply 1 application topically 2 (two) times daily. 12/29/17  Yes Weber, Sarah L, PA-C  fluticasone (FLONASE) 50 MCG/ACT nasal spray Place 2 sprays into both nostrils daily. 12/09/17  Yes Weber, Dema SeverinSarah L, PA-C  omeprazole (PRILOSEC) 20 MG capsule Take 1 capsule (20 mg total) by mouth daily. 03/16/18  Yes Doristine BosworthStallings, Zoe A, MD   Social History   Socioeconomic History  . Marital status: Single    Spouse name: Not on file  . Number of  children: 1  . Years of education: Not on file  . Highest education level: Not on file  Occupational History  . Not on file  Social Needs  . Financial resource strain: Not on file  . Food insecurity    Worry: Not on file    Inability: Not on file  . Transportation needs    Medical: Not on file    Non-medical: Not on file  Tobacco Use  . Smoking status: Never Smoker  . Smokeless tobacco: Never Used  Substance and Sexual Activity  . Alcohol use: Yes    Comment: occ  . Drug use: No  . Sexual activity: Yes    Partners: Male    Birth control/protection: Condom  Lifestyle  . Physical activity    Days per week: Not on file    Minutes per session: Not on file  . Stress: Not on file  Relationships  . Social Musicianconnections    Talks on phone: Not on file    Gets together: Not on file    Attends religious service: Not on file    Active member of club or organization: Not on file    Attends meetings of clubs or organizations: Not on file    Relationship status: Not on file  . Intimate partner violence    Fear of current or ex partner: Not on file    Emotionally abused: Not on file    Physically abused: Not on file    Forced sexual activity: Not on file  Other Topics Concern  . 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   Assessment and Plan: Depression, unspecified depression type - Plan: sertraline (ZOLOFT) 100 MG tablet, hydrOXYzine (ATARAX/VISTARIL) 10 MG tablet  Depression with anxiety - Plan: sertraline (ZOLOFT) 100 MG tablet, hydrOXYzine (ATARAX/VISTARIL) 10 MG tablet  Insomnia, unspecified type  Previously well controlled depression, now with worsening symptoms since start of pandemic, off SSRi, and less meetings with her therapist. worsening past  few months.  Suspect component of anxiety with depression, adjustment component with pandemic.  Difficult situation with limited support system locally.  Denies suicidal/homicidal ideation.  -Restart Zoloft, initially 50 mg daily, and then back up to 100 mg in 1 week if tolerated  -Hydroxyzine 10 mg 1/2-1 3 times daily as needed, initially for sleep.  Potential side effects discussed and precautions given  -Follow-up with counselor as planned, but if worsening symptoms prior to next week [facility]  Phone number provided to the phone as well as in her printed paperwork.  Agrees to be seen if any worsening symptoms.  911/ER precautions discussed if any suicidal symptoms.  -Note out of work  for 1 week, recheck 1 week  Follow Up Instructions:   1 week in person.   I discussed the assessment and treatment plan with the patient. The patient was provided an opportunity to ask questions and all were answered. The patient agreed with the plan and demonstrated an understanding of the instructions.   The patient was advised to call back or seek an in-person evaluation if the symptoms worsen or if the condition fails to improve as anticipated.  I provided 27 minutes of non-face-to-face time during this encounter.  Signed,   Merri Ray, MD [facility].  01/30/19

## 2019-01-30 NOTE — Progress Notes (Signed)
CC- depression and anxiety- Was wanting to get back ON medication for the anxiety and Depression. This has  been going on for few months now and seems to be getting worse and worse. This all has been very overwhelming. PHQ9=26 GAD7=21 Would like to see about getting back on sertraline med and whatever else DR would reccommend

## 2019-01-30 NOTE — Patient Instructions (Addendum)
Restart Zoloft initially 1/2 pill/day for this week and if you are tolerating that dose can increase back up to the 100 mg/day in 1 week.   Hydroxyzine up to 3 times per day but started a half a pill and watch for too much sedation with that medication.  Can initially start with that at bedtime to help with sleep.   Follow-up with therapist as planned but if you are having worsening symptoms or would like to meet with somebody sooner, please go to behavioral health as we discussed.  Their number is below if you need it again.  Out of work note given for now, but we can discuss timing of return to work again when I follow-up with you in 1 week.  Return to the clinic or go to the nearest emergency room if any of your symptoms worsen or new symptoms occur.  [facility] Address: [address] [place], [place] [address] Phone: [phone]   Major Depressive Disorder, Adult Major depressive disorder (MDD) is a mental health condition. MDD often makes you feel sad, hopeless, or helpless. MDD can also cause symptoms in your body. MDD can affect your:  Work.  School.  Relationships.  Other normal activities. MDD can range from mild to very bad. It may occur once (single episode MDD). It can also occur many times (recurrent MDD). The main symptoms of MDD often include:  Feeling sad, depressed, or irritable most of the time.  Loss of interest. MDD symptoms also include:  Sleeping too much or too little.  Eating too much or too little.  A change in your weight.  Feeling tired (fatigue) or having low energy.  Feeling worthless.  Feeling guilty.  Trouble making decisions.  Trouble thinking clearly.  Thoughts of suicide or harming others.  Feeling weak.  Feeling agitated.  Keeping yourself from being around other people (isolation). Follow these instructions at home: Activity  Do these things as told by your doctor: ? Go back to your normal  activities. ? Exercise regularly. ? Spend time outdoors. Alcohol  Talk with your doctor about how alcohol can affect your antidepressant medicines.  Do not drink alcohol. Or, limit how much alcohol you drink. ? This means no more than 1 drink a day for nonpregnant women and 2 drinks a day for men. One drink equals one of these:  12 oz of beer.  5 oz of wine.  1 oz of hard liquor. General instructions  Take over-the-counter and prescription medicines only as told by your doctor.  Eat a healthy diet.  Get plenty of sleep.  Find activities that you enjoy. Make time to do them.  Think about joining a support group. Your doctor may be able to suggest a group for you.  Keep all follow-up visits as told by your doctor. This is important. Where to find more information:  [employer] on Mental Illness: ? www.nami.[place]: ? https://carter.com/  National Suicide Prevention Lifeline: ? (479) 102-9855. This is free, 24-hour help. Contact a doctor if:  Your symptoms get worse.  You have new symptoms. Get help right away if:  You self-harm.  You see, hear, taste, smell, or feel things that are not present (hallucinate). If you ever feel like you may hurt yourself or others, or have thoughts about taking your own life, get help right away. You can go to your nearest emergency department or call:  Your local emergency services (911 in the  [place]  A suicide crisis helpline, such as the National Suicide Prevention Lifeline: ? 539-055-8681. This is open 24 hours a day. This information is not intended to replace advice given to you by your health care provider. Make sure you discuss any questions you have with your health care provider. Document Released: 03/25/2015 Document Revised: 03/26/2017 Document Reviewed: 12/29/2015 Elsevier Patient Education  2020 [place] is a normal reaction to life events. Stress is  what you feel when life demands more than you are used to, or more than you think you can handle. Some stress can be useful, such as studying for a test or meeting a deadline at work. Stress that occurs too often or for too long can cause problems. It can affect your emotional health and interfere with relationships and normal daily activities. Too much stress can weaken your body's defense system (immune system) and increase your risk for physical illness. If you already have a medical problem, stress can make it worse. What are the causes? All sorts of life events can cause stress. An event that causes stress for one person may not be stressful for another person. Major life events, whether positive or negative, commonly cause stress. Examples include:  Losing a job or starting a new job.  Losing a loved one.  Moving to a new town or home.  Getting married or divorced.  Having a baby.  Injury or illness. Less obvious life events can also cause stress, especially if they occur day after day or in combination with each other. Examples include:  Working long hours.  Driving in traffic.  Caring for children.  Being in debt.  Being in a difficult relationship. What are the signs or symptoms? Stress can cause emotional symptoms, including:  Anxiety. This is feeling worried, afraid, on edge, overwhelmed, or out of control.  Anger, including irritation or impatience.  Depression. This is feeling sad, down, helpless, or guilty.  Trouble focusing, remembering, or making decisions. Stress can cause physical symptoms, including:  Aches and pains. These may affect your head, neck, back, stomach, or other areas of your body.  Tight muscles or a clenched jaw.  Low energy.  Trouble sleeping. Stress can cause unhealthy behaviors, including:  Eating to feel better (overeating) or skipping meals.  Working too much or putting off tasks.  Smoking, drinking alcohol, or using drugs to  feel better. How is this diagnosed? Stress is diagnosed through an assessment by your health care provider. He or she may diagnose this condition based on:  Your symptoms and any stressful life events.  Your medical history.  Tests to rule out other causes of your symptoms. Depending on your condition, your health care provider may refer you to a specialist for further evaluation. How is this treated?  Stress management techniques are the recommended treatment for stress. Medicine is not typically recommended for the treatment of stress. Techniques to reduce your reaction to stressful life events include:  Stress identification. Monitor yourself for symptoms of stress and identify what causes stress for you. These skills may help you to avoid or prepare for stressful events.  Time management. Set your priorities, keep a calendar of events, and learn to say "no." Taking these actions can help you avoid making too many commitments. Techniques for coping with stress include:  Rethinking the problem. Try to think realistically about stressful events rather than ignoring them or overreacting. Try to find the positives in a stressful situation rather than focusing on  the negatives.  Exercise. Physical exercise can release both physical and emotional tension. The key is to find a form of exercise that you enjoy and do it regularly.  Relaxation techniques. These relax the body and mind. The key is to find one or more that you enjoy and use the technique(s) regularly. Examples include: ? Meditation, deep breathing, or progressive relaxation techniques. ? Yoga or tai chi. ? Biofeedback, mindfulness techniques, or journaling. ? Listening to music, being out in nature, or participating in other hobbies.  Practicing a healthy lifestyle. Eat a balanced diet, drink plenty of water, limit or avoid caffeine, and get plenty of sleep.  Having a strong support network. Spend time with family, friends, or  other people you enjoy being around. Express your feelings and talk things over with someone you trust. Counseling or talk therapy with a mental health professional may be helpful if you are having trouble managing stress on your own. Follow these instructions at home: Lifestyle   Avoid drugs.  Do not use any products that contain nicotine or tobacco, such as cigarettes and e-cigarettes. If you need help quitting, ask your health care provider.  Limit alcohol intake to no more than 1 drink a day for nonpregnant women and 2 drinks a day for men. One drink equals 12 oz of beer, 5 oz of wine, or 1 oz of hard liquor.  Do not use alcohol or drugs to relax.  Eat a balanced diet that includes fresh fruits and vegetables, whole grains, lean meats, fish, eggs, and beans, and low-fat dairy. Avoid processed foods and foods high in added fat, sugar, and salt.  Exercise at least 30 minutes on 5 or more days each week.  Get 7-8 hours of sleep each night. General instructions   Practice stress management techniques as discussed with your health care provider.  Drink enough fluid to keep your urine clear or pale yellow.  Take over-the-counter and prescription medicines only as told by your health care provider.  Keep all follow-up visits as told by your health care provider. This is important. Contact a health care provider if:  Your symptoms get worse.  You have new symptoms.  You feel overwhelmed by your problems and can no longer manage them on your own. Get help right away if:  You have thoughts of hurting yourself or others. If you ever feel like you may hurt yourself or others, or have thoughts about taking your own life, get help right away. You can go to your nearest emergency department or call:  Your local emergency services (911 in the [place]  A suicide crisis helpline, such as the [place] at 7024392816. This is open 24 hours a  day. Summary  Stress is a normal reaction to life events. It can cause problems if it happens too often or for too long.  Practicing stress management techniques is the best way to treat stress.  Counseling or talk therapy with a mental health professional may be helpful if you are having trouble managing stress on your own. This information is not intended to replace advice given to you by your health care provider. Make sure you discuss any questions you have with your health care provider. Document Released: 10/07/2000 Document Revised: 03/26/2017 Document Reviewed: 06/03/2016 Elsevier Patient Education  2020 [employer].

## 2019-02-01 ENCOUNTER — Telehealth: Payer: Self-pay | Admitting: Family Medicine

## 2019-02-01 NOTE — Telephone Encounter (Signed)
Pt dropped off FMLA and disability pprwrk at the front desk. Copies given to the pt, fee paid, left at nurses station in provider box. Pprwrk also scanned to md doc request folder

## 2019-02-02 NOTE — Telephone Encounter (Signed)
Hello,   Paperwork received in provider's box.  Please ensure that patient has f/u scheduled to discuss s/s and review paperwork.  Expected f/u is 10/13 or around there per recent OV note.   Thank you.

## 2019-02-03 NOTE — Telephone Encounter (Signed)
Spoke with pt and scheduled appt for fmla pprwrk

## 2019-02-06 ENCOUNTER — Ambulatory Visit (INDEPENDENT_AMBULATORY_CARE_PROVIDER_SITE_OTHER): Payer: 59 | Admitting: Family Medicine

## 2019-02-06 ENCOUNTER — Other Ambulatory Visit: Payer: Self-pay

## 2019-02-06 VITALS — BP 136/86 | HR 88 | Temp 99.1°F | Wt 230.4 lb

## 2019-02-06 DIAGNOSIS — G4700 Insomnia, unspecified: Secondary | ICD-10-CM | POA: Diagnosis not present

## 2019-02-06 DIAGNOSIS — F418 Other specified anxiety disorders: Secondary | ICD-10-CM | POA: Diagnosis not present

## 2019-02-06 DIAGNOSIS — R69 Illness, unspecified: Secondary | ICD-10-CM | POA: Diagnosis not present

## 2019-02-06 DIAGNOSIS — Z1329 Encounter for screening for other suspected endocrine disorder: Secondary | ICD-10-CM | POA: Diagnosis not present

## 2019-02-06 NOTE — Progress Notes (Signed)
Subjective:    Patient ID: Brenda Molina, female    DOB: May 30, 1985, 33 y.o.   MRN: [id number]  HPI Brenda Molina is a 33 y.o. female Presents today for: Chief Complaint  Patient presents with  . Depression/Anxiety    In person f/u from 01/30/19 patient was started on zoloft and she tried to take 19m 50 in am and 577min pm the 10040made me nausea and the 50 mg do not seems to be helping. Also the hydroxyzine do seem to help me sleep. PHQ=9= 23, GAD7=19    Depression/Anxiety: Follow-up from 1 week ago.  Significant increase in depression anxiety over the previous few months, especially within the last 1 month.  More depressed[place] but not within the past month.  Had been off sertraline since April.  Trouble sleeping at that time.  Denies suicidal thoughts, homicidal thoughts at that time.  PHQ-9 score of 26 last Monday with a GAD-7 score of 21. Restarted Zoloft 50 mg daily, then up to 100 mg in 1 week if tolerated.  Hydroxyzine was also prescribed 10 mg 1/2-1 3 times daily as needed, especially for sleep, and had follow-up planned with counseling within 1 week  She started back on Zoloft 50 mg.  Try to increase to 100 mg after 2 days, but that caused nausea. Back on 61m49mily. Initially jittery, better now.  Hydroxyzine - only took twice. Took 1/2 pill. Sleeps for about 2 hrs - tossing and turning still. No side effects.  Tried 1/2 pill during the day when anxious - helped some.  appt with therapist today - NancJonita Albeeo SI, HI or thoughts.  Has talked to family - has asked mom to help for the weekend, but she did not help. Trouble getting motivated in the morning.  Out of work since last Monday 10/5.  Trouble getting to work with current depression symptoms.  No regular exercise. Has been lying at home.    Depression screen PHQ [facility] 02/06/2019 03/16/2018 12/29/2017 12/09/2017 12/08/2016  Decreased Interest 3 0 2 3 0   Down, Depressed, Hopeless 3 0 2 3 0  PHQ - 2 Score 6 0 4 6 0  Altered sleeping 3 - 2 3 -  Tired, decreased energy 3 - 2 3 -  Change in appetite 2 - 2 3 -  Feeling bad or failure about yourself  3 - 2 3 -  Trouble concentrating 3 - 2 3 -  Moving slowly or fidgety/restless 2 - 2 3 -  Suicidal thoughts 1 - 0 0 -  PHQ-9 Score 23 - 16 24 -  Difficult doing work/chores - - Somewhat difficult Extremely dIfficult -   GAD 7 : Generalized Anxiety Score 02/06/2019 12/29/2017 12/09/2017  Nervous, Anxious, on Edge 2 0 3  Control/stop worrying 3 1 3   Worry too much - different things 3 1 3   Trouble relaxing 3 2 3   Restless 3 2 3   Easily annoyed or irritable 3 3 3   Afraid - awful might happen 2 0 2  Total GAD 7 Score 19 9 20   Anxiety Difficulty - Not difficult at all Very difficult        Patient Active Problem List   Diagnosis Date Noted  . Perennial and seasonal allergic rhinitis 04/18/2018  . Cough, persistent 04/18/2018  . History of wheezing 04/18/2018  . 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 Normal vaginal delivery 06/06/2015  . RhD  negative 06/05/2015  . Preterm premature rupture of membranes 06/05/2015   Past Medical History:  Diagnosis Date  . Allergy   . Anxiety   . PCOS (polycystic ovarian syndrome)    Past Surgical History:  Procedure Laterality Date  . WISDOM TOOTH EXTRACTION     No Known Allergies Prior to Admission medications   Medication Sig Start Date End Date Taking? Authorizing Provider  albuterol (PROVENTIL HFA;VENTOLIN HFA) 108 (90 Base) MCG/ACT inhaler Inhale 2 puffs into the lungs every 6 (six) hours as needed (for cough). 03/16/18  Yes Forrest Moron, MD  albuterol (PROVENTIL HFA;VENTOLIN HFA) 108 (90 Base) MCG/ACT inhaler May use 1-2 spray every 4-6 hours as needed for cough,wheeze and shortness of breathe 04/18/18  Yes Bobbitt, Sedalia Muta, MD  fluticasone (FLONASE) 50 MCG/ACT nasal spray Place 2 sprays into both nostrils daily. 12/09/17  Yes Weber,  Sarah L, PA-C  hydrOXYzine (ATARAX/VISTARIL) 10 MG tablet Take 0.5-1 tablets (5-10 mg total) by mouth 3 (three) times daily as needed for anxiety. 01/30/19  Yes Wendie Agreste, MD  omeprazole (PRILOSEC) 20 MG capsule Take 1 capsule (20 mg total) by mouth daily. 03/16/18  Yes Delia Chimes A, MD  sertraline (ZOLOFT) 100 MG tablet Take 1 tablet (100 mg total) by mouth daily. Start 1/2 pill for 1 week., then increase to full pill if tolerated. 01/30/19  Yes Wendie Agreste, MD   Social History   Socioeconomic History  . Marital status: Single    Spouse name: Not on file  . Number of children: 1  . Years of education: Not on file  . Highest education level: Not on file  Occupational History  . Not on file  Social Needs  . Financial resource strain: Not on file  . Food insecurity    Worry: Not on file    Inability: Not on file  . Transportation needs    Medical: Not on file    Non-medical: Not on file  Tobacco Use  . Smoking status: Never Smoker  . Smokeless tobacco: Never Used  Substance and Sexual Activity  . Alcohol use: Yes    Comment: occ  . Drug use: No  . Sexual activity: Yes    Partners: Male    Birth control/protection: Condom  Lifestyle  . Physical activity    Days per week: Not on file    Minutes per session: Not on file  . Stress: Not on file  Relationships  . Social Herbalist on phone: Not on file    Gets together: Not on file    Attends religious service: Not on file    Active member of club or organization: Not on file    Attends meetings of clubs or organizations: Not on file    Relationship status: Not on file  . Intimate partner violence    Fear of current or ex partner: Not on file    Emotionally abused: Not on file    Physically abused: Not on file    Forced sexual activity: Not on file  Other Topics Concern  . 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 Has no  children. 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     Objective:   Physical Exam Vitals signs reviewed.  Constitutional:      General: She is not in  acute distress.    Appearance: She is well-developed.  HENT:     Head: Normocephalic and atraumatic.  Cardiovascular:     Rate and Rhythm: Normal rate.  Pulmonary:     Effort: Pulmonary effort is normal.  Neurological:     Mental Status: She is alert and oriented to person, place, and time.  Psychiatric:        Attention and Perception: Attention normal.        Mood and Affect: Mood normal. Affect is tearful.        Speech: Speech normal.        Behavior: Behavior normal.        Thought Content: Thought content normal. Thought content is not paranoid. Thought content does not include homicidal or suicidal ideation.        Judgment: Judgment normal.    Vitals:   02/06/19 0934  BP: 136/86  Pulse: 88  Temp: 99.1 F (37.3 C)  TempSrc: Oral  SpO2: 98%  Weight: 230 lb 6.4 oz (104.5 kg)       Assessment & Plan:  KATHRYNE RAMELLA is a 33 y.o. female Depression with anxiety Insomnia, unspecified type Screening for thyroid disorder - Plan: TSH  -Slight improvement in symptoms, still significant depression with anxiety.  Denies suicidal/homicidal ideation.    -Did not tolerate immediate increased to 100 mg of Zoloft, return to 50 mg dosing for now.  Continue hydroxyzine with options of using that medication throughout the day, the lowest effective dose.    -Follow-up with counseling as planned, out of work for now with repeat in 9 days.  Will complete paperwork for her job.  -Other coping techniques including walking or some form of activity/exercise daily with her daughter which may be helpful.  No orders of the defined types were placed in this encounter.  Patient Instructions     Ok to try the full pill of hydroxyzine up to 3 times per day for sleep or anxiety.  Meet with  therapist today to discuss next steps, but I would continue the Zoloft 36m (1/2) for now.  Some form of exercise such as walking daily with Aria.  Continue to discuss plan with therapist and family to see if help needed or can be provided.  Follow up in 9 days - telemed or in office.  Return to the clinic or go to the nearest emergency room if any of your symptoms worsen or new symptoms occur.    If you have lab work done today you will be contacted with your lab results within the next 2 weeks.  If you have not heard from [place] please contact [place] The fastest way to get your results is to register for My Chart.   IF you received an x-ray today, you will receive an invoice from [facility]. Please contact [facility] at [phone] questions or concerns regarding your invoice.   IF you received labwork today, you will receive an invoice from [place] Please contact [facility] at [phone] questions or concerns regarding your invoice.   Our billing staff will not be able to assist you with questions regarding bills from these companies.  You will be contacted with the lab results as soon as they are available. The fastest way to get your results is to activate your My Chart account. Instructions are located on the last page of this paperwork. If you have not heard from [place] the results in 2 weeks, please contact this office.  Signed,   Merri Ray, MD [facility] [place].  02/07/19 12:38 PM

## 2019-02-06 NOTE — Patient Instructions (Addendum)
   Ok to try the full pill of hydroxyzine up to 3 times per day for sleep or anxiety.  Meet with therapist today to discuss next steps, but I would continue the Zoloft 50mg  (1/2) for now.  Some form of exercise such as walking daily with Aria.  Continue to discuss plan with therapist and family to see if help needed or can be provided.  Follow up in 9 days - telemed or in office.  Return to the clinic or go to the nearest emergency room if any of your symptoms worsen or new symptoms occur.    If you have lab work done today you will be contacted with your lab results within the next 2 weeks.  If you have not heard from [place] then please contact us. The fastest way to get your results is to register for My Chart.   IF you received an x-ray today, you will receive an invoice from [facility]. Please contact [facility] at [phone] with questions or concerns regarding your invoice.   IF you received labwork today, you will receive an invoice from [place]. Please contact [facility] at [phone] with questions or concerns regarding your invoice.   Our billing staff will not be able to assist you with questions regarding bills from these companies.  You will be contacted with the lab results as soon as they are available. The fastest way to get your results is to activate your My Chart account. Instructions are located on the last page of this paperwork. If you have not heard from [place] regarding the results in 2 weeks, please contact this office.

## 2019-02-07 ENCOUNTER — Encounter: Payer: Self-pay | Admitting: Family Medicine

## 2019-02-07 LAB — TSH: TSH: 1.1 u[IU]/mL (ref 0.450–4.500)

## 2019-02-09 ENCOUNTER — Telehealth: Payer: Self-pay | Admitting: Family Medicine

## 2019-02-09 NOTE — Telephone Encounter (Signed)
LVM to let patient know that her FMLA paperwork is ready for pick up.

## 2019-02-14 DIAGNOSIS — R69 Illness, unspecified: Secondary | ICD-10-CM | POA: Diagnosis not present

## 2019-02-15 ENCOUNTER — Telehealth: Payer: Self-pay | Admitting: Family Medicine

## 2019-02-15 ENCOUNTER — Telehealth (INDEPENDENT_AMBULATORY_CARE_PROVIDER_SITE_OTHER): Payer: 59 | Admitting: Family Medicine

## 2019-02-15 ENCOUNTER — Other Ambulatory Visit: Payer: Self-pay

## 2019-02-15 ENCOUNTER — Encounter: Payer: Self-pay | Admitting: Family Medicine

## 2019-02-15 DIAGNOSIS — F32A Depression, unspecified: Secondary | ICD-10-CM

## 2019-02-15 DIAGNOSIS — G4700 Insomnia, unspecified: Secondary | ICD-10-CM

## 2019-02-15 DIAGNOSIS — F418 Other specified anxiety disorders: Secondary | ICD-10-CM | POA: Diagnosis not present

## 2019-02-15 DIAGNOSIS — R69 Illness, unspecified: Secondary | ICD-10-CM | POA: Diagnosis not present

## 2019-02-15 DIAGNOSIS — F329 Major depressive disorder, single episode, unspecified: Secondary | ICD-10-CM

## 2019-02-15 MED ORDER — BUSPIRONE HCL 7.5 MG PO TABS: 7.5000 mg | ORAL_TABLET | Freq: Two times a day (BID) | ORAL | 1 refills | Status: DC

## 2019-02-15 MED ORDER — HYDROXYZINE HCL 10 MG PO TABS: 5.0000 mg | ORAL_TABLET | Freq: Every evening | ORAL | 1 refills | Status: DC | PRN

## 2019-02-15 NOTE — Progress Notes (Signed)
CC- 9 day f/u depression- Depression is getting a little better. Staying a little longer now. Just being to feel a little change with the medication. GAD7= 15 PHQ9=16

## 2019-02-15 NOTE — Telephone Encounter (Signed)
Lvmtcb and schedule telemed for depression and anxiety. 1 week f/u from 10/21

## 2019-02-15 NOTE — Progress Notes (Signed)
Virtual Visit via Telephone Note  I connected with Brenda Molina on 02/15/19 at 11:21 AM by telephone and verified that I am speaking with the correct person using two identifiers.   I discussed the limitations, risks, security and privacy concerns of performing an evaluation and management service by telephone and the availability of in person appointments. I also discussed with the patient that there may be a patient responsible charge related to this service. The patient expressed understanding and agreed to proceed, consent obtained  Chief complaint: Depression, anxiety  History of Present Illness: Brenda Molina is a 33 y.o. female  Depression  With anxiety: See previous visits, most recently discussed October 12.  Increased anxiety past few months, especially previous month.  Some difficulty with local support system as family in [place].  She has been off her Zoloft since April, and had not met with a therapist in the prior month.  We restarted Zoloft 50 mg daily with option of 100 mg initially, and hydroxyzine 10 mg 3 times daily as needed for insomnia and anxiety.  Also plan for restart of therapy.  Did not tolerate 100 mg dosing of Zoloft.  Only took few hydroxyzine when discussed last visit.  No SI/HI.  Planned meeting with therapist after last visit.  Due to significant depression anxiety symptoms she was taken out of work since October 5, significant anhedonia. Paperwork completed.   Recommended she increase the hydroxyzine up to 3 times per day, some form of exercise daily and spending time with her daughter recommended.  GAD 7: 15 PHQ9: 16  Has been trying to get outside more, more time spent with [place]. Spoke to her mom about trying to get help - not sure if she will be able to get off work.  No manic symptoms.  Some improved sleep - less wakening, but still some issues getting to sleep and waking up at times during the night - hard to brin. Not taking hydroxyzine  during day - trying to get schedule back on track. Still feeling anxious during the day, worried about sedation with hydroxyzine.  Overall some improvement in depression symptoms.  Still on zoloft 43m QD (1/2 of 1041m. No SI, HI.  S/p 2 visits with therapists. Planned on weekly for now, then every 2 weeks.  Plan to RTW tomorrow, but does not feel ready. Therapist worried about RTW tomorrow when discussed at their visit yesterday.      GAD 7 : Generalized Anxiety Score 02/06/2019 12/29/2017 12/09/2017  Nervous, Anxious, on Edge 2 0 3  Control/stop worrying _0 Worry too much - different things _1 Trouble relaxing _2 Restless _3 Easily annoyed or irritable _4 Afraid - awful might happen 2 0 2  Total GAD 7 Score _5 Anxiety Difficulty - Not difficult at all Very difficult       [facility] 02/06/2019 03/16/2018 12/29/2017 12/09/2017 12/08/2016  Decreased Interest 3 0 2 3 0  Down, Depressed, Hopeless 3 0 2 3 0  PHQ - 2 Score 6 0 4 6 0  Altered sleeping 3 - 2 3 -  Tired, decreased energy 3 - 2 3 -  Change in appetite 2 - 2 3 -  Feeling bad or failure about yourself  3 - 2 3 -  Trouble concentrating 3 - 2 3 -  Moving slowly or fidgety/restless 2 - 2 3 -  Suicidal thoughts 1 - 0 0 -  PHQ-9 Score 23 - 16 24 -  Difficult doing work/chores - - Somewhat difficult Extremely dIfficult -       Patient Active Problem List   Diagnosis Date Noted  . Perennial and seasonal allergic rhinitis 04/18/2018  . Cough, persistent 04/18/2018  . History of wheezing 04/18/2018  . 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 Normal vaginal delivery 06/06/2015  . RhD negative 06/05/2015  . Preterm premature rupture of membranes 06/05/2015   Past Medical History:  Diagnosis Date  . Allergy   . Anxiety   . PCOS (polycystic ovarian syndrome)    Past Surgical History:  Procedure Laterality Date  . WISDOM TOOTH EXTRACTION     No Known Allergies Prior to Admission  medications   Medication Sig Start Date End Date Taking? Authorizing Provider  albuterol (PROVENTIL HFA;VENTOLIN HFA) 108 (90 Base) MCG/ACT inhaler Inhale 2 puffs into the lungs every 6 (six) hours as needed (for cough). 03/16/18  Yes Forrest Moron, MD  albuterol (PROVENTIL HFA;VENTOLIN HFA) 108 (90 Base) MCG/ACT inhaler May use 1-2 spray every 4-6 hours as needed for cough,wheeze and shortness of breathe 04/18/18  Yes Bobbitt, Sedalia Muta, MD  fluticasone (FLONASE) 50 MCG/ACT nasal spray Place 2 sprays into both nostrils daily. 12/09/17  Yes Weber, Sarah L, PA-C  hydrOXYzine (ATARAX/VISTARIL) 10 MG tablet Take 0.5-1 tablets (5-10 mg total) by mouth 3 (three) times daily as needed for anxiety. 01/30/19  Yes Wendie Agreste, MD  omeprazole (PRILOSEC) 20 MG capsule Take 1 capsule (20 mg total) by mouth daily. 03/16/18  Yes Delia Chimes A, MD  sertraline (ZOLOFT) 100 MG tablet Take 1 tablet (100 mg total) by mouth daily. Start 1/2 pill for 1 week., then increase to full pill if tolerated. 01/30/19  Yes Wendie Agreste, MD   Social History   Socioeconomic History  . Marital status: Single    Spouse name: Not on file  . Number of children: 1  . Years of education: Not on file  . Highest education level: Not on file  Occupational History  . Not on file  Social Needs  . Financial resource strain: Not on file  . Food insecurity    Worry: Not on file    Inability: Not on file  . Transportation needs    Medical: Not on file    Non-medical: Not on file  Tobacco Use  . Smoking status: Never Smoker  . Smokeless tobacco: Never Used  Substance and Sexual Activity  . Alcohol use: Yes    Comment: occ  . Drug use: No  . Sexual activity: Yes    Partners: Male    Birth control/protection: Condom  Lifestyle  . Physical activity    Days per week: Not on file    Minutes per session: Not on file  . Stress: Not on file  Relationships  . Social Herbalist on phone: Not on file     Gets together: Not on file    Attends religious service: Not on file    Active member of club or organization: Not on file    Attends meetings of clubs or organizations: Not on file    Relationship status: Not on file  . Intimate partner violence    Fear of current or ex partner: Not on file    Emotionally abused: Not on file    Physically abused: Not on file    Forced sexual activity: Not on file  Other Topics Concern  . 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  Appropriate responses.  All questions answered.  There were no vitals filed for this visit.   Assessment and Plan: Depression with anxiety  Insomnia, unspecified type  Depression, unspecified depression type  Improving.  Still some significant symptoms, concern with return to work at this point.  Has ongoing care with therapist.   -Start BuSpar for anxiety symptoms, should be less sedation during the day than hydroxyzine.  7.5 mg twice daily to start.  -Continue Zoloft at 50 mg dosing for now  -Hydroxyzine still prescribed if needed at bedtime  -Continue stress management techniques, activity/exercise, follow-up with counselor, recheck 1 week.  Note provided for out of work for 1 more week.  Follow Up Instructions:    I discussed the assessment and treatment plan with the patient. The patient was provided an opportunity to ask questions and all were answered. The patient agreed with the plan and demonstrated an understanding of the instructions.   The patient was advised to call back or seek an in-person evaluation if the symptoms worsen or if the condition fails to improve as anticipated.  I provided 16 minutes of non-face-to-face time during this encounter.  Signed,   Merri Ray, MD [facility] [place].  02/15/19

## 2019-02-22 ENCOUNTER — Telehealth: Payer: 59 | Admitting: Family Medicine

## 2019-02-22 ENCOUNTER — Other Ambulatory Visit: Payer: Self-pay

## 2019-02-22 ENCOUNTER — Encounter: Payer: Self-pay | Admitting: Family Medicine

## 2019-02-22 ENCOUNTER — Ambulatory Visit (INDEPENDENT_AMBULATORY_CARE_PROVIDER_SITE_OTHER): Payer: 59 | Admitting: Family Medicine

## 2019-02-22 VITALS — BP 140/81 | HR 96 | Temp 98.4°F | Resp 14 | Wt 227.6 lb

## 2019-02-22 DIAGNOSIS — F418 Other specified anxiety disorders: Secondary | ICD-10-CM

## 2019-02-22 DIAGNOSIS — R69 Illness, unspecified: Secondary | ICD-10-CM | POA: Diagnosis not present

## 2019-02-22 NOTE — Progress Notes (Signed)
Subjective:    Patient ID: Brenda Molina, female    DOB: Oct 01, 1985, 33 y.o.   MRN: [id number]  HPI Brenda Molina is a 33 y.o. female Presents today for: Chief Complaint  Patient presents with  . Depression    f/u for anxiety and depression and to get flma form filled out. GAD=7 PHQ9=12. Patient stated she is just now starting to feel the medication working. She have an appt with the counsilor on 02/24/19.   Depression:  Depression screen [facility] 2/9 02/22/2019 02/06/2019 03/16/2018 12/29/2017 12/09/2017  Decreased Interest 2 3 0 2 3  Down, Depressed, Hopeless 2 3 0 2 3  PHQ - 2 Score 4 6 0 4 6  Altered sleeping 2 3 - 2 3  Tired, decreased energy 1 3 - 2 3  Change in appetite 2 2 - 2 3  Feeling bad or failure about yourself  1 3 - 2 3  Trouble concentrating 1 3 - 2 3  Moving slowly or fidgety/restless 0 2 - 2 3  Suicidal thoughts 1 1 - 0 0  PHQ-9 Score 12 23 - 16 24  Difficult doing work/chores - - - Somewhat difficult Extremely dIfficult   GAD 7 : Generalized Anxiety Score 02/22/2019 02/06/2019 12/29/2017 12/09/2017  Nervous, Anxious, on Edge 2 2 0 3  Control/stop worrying 2 3 1 3   Worry too much - different things 2 3 1 3   Trouble relaxing 2 3 2 3   Restless 1 3 2 3   Easily annoyed or irritable 1 3 3 3   Afraid - awful might happen 2 2 0 2  Total GAD 7 Score 12 19 9 20   Anxiety Difficulty - - Not difficult at all Very difficult    See previous visits, increasing anxiety past few months, difficulty with support system as in [place].  Had been off Zoloft since April, then restarted Zoloft 50 mg daily, did not tolerate 100 mg.  Hydroxyzine 10 mg 3 times daily given if needed for insomnia.  She was taken out of work since October 5 due to significant anxiety and anhedonia.  Paperwork was completed for work.  GAD-7 15 PHQ-9 16 last visit.  Had some improvement with meeting with therapist.  1 additional week out of work provided. Continued Zoloft 50 mg daily, BuSpar started at 7.5 mg  twice daily.  Hydroxyzine if needed at bedtime due to sedation.  Feels that meds are starting to work, specifically today. Taking Buspar BID. Still on zoloft 36m QD.  Has taken hydroxyzine few times only  - 2 nights ago at midnight, some sedation next am. Met with therapist last week and again this Friday. Has not met with her since last visit.  Feels like may be ready to RTW in next week.  Did get in contact with prior sitter - she may be able to help some.   Felt better with walking earlier this week - less anhedonia. Walking daily this week.    Patient Active Problem List   Diagnosis Date Noted  . Perennial and seasonal allergic rhinitis 04/18/2018  . Cough, persistent 04/18/2018  . History of wheezing 04/18/2018  . 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 Normal vaginal delivery 06/06/2015  . RhD negative 06/05/2015  . Preterm premature rupture of membranes 06/05/2015   Past Medical History:  Diagnosis Date  . Allergy   . Anxiety   . PCOS (polycystic ovarian syndrome)    Past Surgical History:  Procedure Laterality Date  . WISDOM TOOTH EXTRACTION  No Known Allergies Prior to Admission medications   Medication Sig Start Date End Date Taking? Authorizing Provider  albuterol (PROVENTIL HFA;VENTOLIN HFA) 108 (90 Base) MCG/ACT inhaler Inhale 2 puffs into the lungs every 6 (six) hours as needed (for cough). 03/16/18  Yes Forrest Moron, MD  albuterol (PROVENTIL HFA;VENTOLIN HFA) 108 (90 Base) MCG/ACT inhaler May use 1-2 spray every 4-6 hours as needed for cough,wheeze and shortness of breathe 04/18/18  Yes Bobbitt, Sedalia Muta, MD  busPIRone (BUSPAR) 7.5 MG tablet Take 1 tablet (7.5 mg total) by mouth 2 (two) times daily. 02/15/19  Yes Wendie Agreste, MD  fluticasone (FLONASE) 50 MCG/ACT nasal spray Place 2 sprays into both nostrils daily. 12/09/17  Yes Weber, Sarah L, PA-C  hydrOXYzine (ATARAX/VISTARIL) 10 MG tablet Take 0.5-1 tablets (5-10 mg total) by mouth at bedtime as  needed for anxiety (or sleep). 02/15/19  Yes Wendie Agreste, MD  omeprazole (PRILOSEC) 20 MG capsule Take 1 capsule (20 mg total) by mouth daily. 03/16/18  Yes Delia Chimes A, MD  sertraline (ZOLOFT) 100 MG tablet Take 1 tablet (100 mg total) by mouth daily. Start 1/2 pill for 1 week., then increase to full pill if tolerated. 01/30/19  Yes Wendie Agreste, MD   Social History   Socioeconomic History  . Marital status: Single    Spouse name: Not on file  . Number of children: 1  . Years of education: Not on file  . Highest education level: Not on file  Occupational History  . Not on file  Social Needs  . Financial resource strain: Not on file  . Food insecurity    Worry: Not on file    Inability: Not on file  . Transportation needs    Medical: Not on file    Non-medical: Not on file  Tobacco Use  . Smoking status: Never Smoker  . Smokeless tobacco: Never Used  Substance and Sexual Activity  . Alcohol use: Yes    Comment: occ  . Drug use: No  . Sexual activity: Yes    Partners: Male    Birth control/protection: Condom  Lifestyle  . Physical activity    Days per week: Not on file    Minutes per session: Not on file  . Stress: Not on file  Relationships  . Social Herbalist on phone: Not on file    Gets together: Not on file    Attends religious service: Not on file    Active member of club or organization: Not on file    Attends meetings of clubs or organizations: Not on file    Relationship status: Not on file  . Intimate partner violence    Fear of current or ex partner: Not on file    Emotionally abused: Not on file    Physically abused: Not on file    Forced sexual activity: Not on file  Other Topics Concern  . 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      Objective:    Physical Exam Constitutional:      General: She is not in acute distress.    Appearance: She is well-developed.  HENT:     Head: Normocephalic and atraumatic.  Cardiovascular:     Rate and Rhythm: Normal  rate.  Pulmonary:     Effort: Pulmonary effort is normal.  Neurological:     Mental Status: She is alert and oriented to person, place, and time.  Psychiatric:        Mood and Affect: Mood normal.        Behavior: Behavior normal.        Thought Content: Thought content normal.        Judgment: Judgment normal.    Vitals:   02/22/19 1638  BP: 140/81  Pulse: 96  Resp: 14  Temp: 98.4 F (36.9 C)  TempSrc: Oral  SpO2: 100%  Weight: 227 lb 9.6 oz (103.2 kg)      Assessment & Plan:  RAEYA MERRITTS is a 33 y.o. female Depression with anxiety  -Improving.  Tolerating current med regimen.  Earlier evening dosing discussed of hydroxyzine if needed, can try melatonin over-the-counter as well.  Continue BuSpar, Zoloft same doses.  Continue exercise.  Continue follow-up with therapist.  Plan for return to work in 1 week pending discussion with therapist this Friday.  Can adjust timing if needed.  No orders of the defined types were placed in this encounter.  Patient Instructions     Keep appt with therapist this week. No change in meds - take hydroxyzine earlier in the night if needed. Melatonin may also help with sleep. Keep up the good work with exercise/walking.   Plan on return to work next Wednesday, but discuss this plan with your therapist.  Please let me know if there are questions.    If you have lab work done today you will be contacted with your lab results within the next 2 weeks.  If you have not heard from [place] then please contact us. The fastest way to get your results is to register for My Chart.   IF you received an x-ray today, you will receive an invoice from [facility]. Please contact [facility] at [phone] with questions or  concerns regarding your invoice.   IF you received labwork today, you will receive an invoice from [facility]. Please contact [facility] at [phone] with questions or concerns regarding your invoice.   Our billing staff will not be able to assist you with questions regarding bills from these companies.  You will be contacted with the lab results as soon as they are available. The fastest way to get your results is to activate your My Chart account. Instructions are located on the last page of this paperwork. If you have not heard from [place] regarding the results in 2 weeks, please contact this office.       Signed,   Merri Ray, MD [facility] [place].  02/22/19 7:03 PM

## 2019-02-22 NOTE — Patient Instructions (Addendum)
   Keep appt with therapist this week. No change in meds - take hydroxyzine earlier in the night if needed. Melatonin may also help with sleep. Keep up the good work with exercise/walking.   Plan on return to work next Wednesday, but discuss this plan with your therapist.  Please let me know if there are questions.    If you have lab work done today you will be contacted with your lab results within the next 2 weeks.  If you have not heard from [place] then please contact us. The fastest way to get your results is to register for My Chart.   IF you received an x-ray today, you will receive an invoice from [facility]. Please contact [facility] at [phone] with questions or concerns regarding your invoice.   IF you received labwork today, you will receive an invoice from [facility]. Please contact [facility] at [phone] with questions or concerns regarding your invoice.   Our billing staff will not be able to assist you with questions regarding bills from these companies.  You will be contacted with the lab results as soon as they are available. The fastest way to get your results is to activate your My Chart account. Instructions are located on the last page of this paperwork. If you have not heard from [place] regarding the results in 2 weeks, please contact this office.

## 2019-02-24 DIAGNOSIS — R69 Illness, unspecified: Secondary | ICD-10-CM | POA: Diagnosis not present

## 2019-03-02 ENCOUNTER — Other Ambulatory Visit: Payer: Self-pay

## 2019-03-02 ENCOUNTER — Telehealth (INDEPENDENT_AMBULATORY_CARE_PROVIDER_SITE_OTHER): Payer: 59 | Admitting: Family Medicine

## 2019-03-02 DIAGNOSIS — F418 Other specified anxiety disorders: Secondary | ICD-10-CM

## 2019-03-02 DIAGNOSIS — R69 Illness, unspecified: Secondary | ICD-10-CM | POA: Diagnosis not present

## 2019-03-02 NOTE — Progress Notes (Signed)
Spoke with pt and she states she needs to follow-up from her last OV on 02/22/19 about medication management. She states the medication is really  helping her at this time with her depression. She also states she needs paper work faxed to her insurance office so I informed her that I would leave CMA a note and she would follow-up with her tomorrow about paper work. She verbalized understanding. She had no other concerns at this time.

## 2019-03-02 NOTE — Patient Instructions (Addendum)
Glad to hear things are going better. OK to take Buspar up to 3 times per day, continue Zoloft 100mg  per day. Hydroxyzine earlier in the evening if needed. Follow up in 2 weeks, but sooner if needed.

## 2019-03-02 NOTE — Progress Notes (Signed)
Virtual Visit via Telephone Note  I connected with Brenda Molina on 03/02/19 at 5:35 PM by telephone and verified that I am speaking with the correct person using two identifiers.   I discussed the limitations, risks, security and privacy concerns of performing an evaluation and management service by telephone and the availability of in person appointments. I also discussed with the patient that there may be a patient responsible charge related to this service. The patient expressed understanding and agreed to proceed, consent obtained  Chief complaint: Depression/anxiety  History of Present Illness: Brenda Molina is a 33 y.o. female  Depression with anxiety: See prior visits.  Increased anxiety past few months.  Initially restarted Zoloft 50 mg daily, option of 100 mg but did not tolerate that dose.  Remained on 50 mg, hydroxyzine 10 mg 3 times daily given if needed, primarily at bedtime if needed for sleep/sedation.  BuSpar started 7.5 mg twice daily at prior visit.  Improvement with therapist meetings at last visit we discussed October 28.  Hydroxyzine few nights only.  BuSpar twice per day was working well, I reached out to the previous sitter that may be able to help at home.  Walking/exercise also lessened the anhedonia.  Discussed return to work in 1 week pending discussion with therapist.  Since last visit: Doing good. Has been walking daily.  Increased zoloft to 165m 6 days ago - feels better on this dose, not feeling sick as in past.  Started back at work yesterday - working remotely. 8-4 with (2) 153m breaks. Able to take lunch if needed, can flex hours if needed.  Rode stationary bike before work, then walking afterward. Some fatigue, but felt like work went ok.  There was a lot to catch up on - chat yesterday, catching up in training. 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 Taking Buspar BID - one early and then at lunchtime.  Sertraline 508mn the morning.  Met with therapist last week and again on the 12th. Plan to take one day at a time.  No hydroxyzine needed in past 2 days. Nighttime wakening 3 times last night. Has not tried taking earlier in the evening  [facility] 03/02[facility]2019 02/22/2019 02/06/2019 03/16/2018 12/29/2017  Decreased Interest 0 2 3 0 2  Down, Depressed, Hopeless 0 2 3 0 2  PHQ - 2 Score 0 4 6 0 4  Altered sleeping - 2 3 - 2  Tired, decreased energy - 1 3 - 2  Change in appetite - 2 2 - 2  Feeling bad or failure about yourself  - 1 3 - 2  Trouble concentrating - 1 3 - 2  Moving slowly or fidgety/restless - 0 2 - 2  Suicidal thoughts - 1 1 - 0  PHQ-9 Score - 12 23 - 16  Difficult doing work/chores - - - - Somewhat difficult   GAD 7 : Generalized Anxiety Score 02/22/2019 02/06/2019 12/29/2017 12/09/2017  Nervous, Anxious, on Edge 2 2 0 3  Control/stop worrying _0 Worry too much - different things _1 Trouble relaxing _2 Restless _3 Easily annoyed or irritable _4 Afraid - awful might happen 2 2 0 2  Total GAD 7 Score _5 Anxiety Difficulty - - Not difficult at all Very difficult  Patient Active Problem List   Diagnosis Date Noted  . Perennial and seasonal allergic rhinitis 04/18/2018  . Cough, persistent 04/18/2018  . History of wheezing 04/18/2018  . 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 Normal vaginal delivery 06/06/2015  . RhD negative 06/05/2015  . Preterm premature rupture of membranes 06/05/2015   Past Medical History:  Diagnosis Date  . Allergy   . Anxiety   . PCOS (polycystic ovarian syndrome)    Past Surgical History:  Procedure Laterality Date  . WISDOM TOOTH EXTRACTION     No Known Allergies Prior to Admission medications   Medication Sig Start Date End Date Taking? Authorizing Provider  albuterol (PROVENTIL HFA;VENTOLIN HFA) 108 (90 Base) MCG/ACT inhaler Inhale 2 puffs into the lungs every 6 (six) hours as  needed (for cough). 03/16/18  Yes Forrest Moron, MD  albuterol (PROVENTIL HFA;VENTOLIN HFA) 108 (90 Base) MCG/ACT inhaler May use 1-2 spray every 4-6 hours as needed for cough,wheeze and shortness of breathe 04/18/18  Yes Bobbitt, Sedalia Muta, MD  busPIRone (BUSPAR) 7.5 MG tablet Take 1 tablet (7.5 mg total) by mouth 2 (two) times daily. 02/15/19  Yes Wendie Agreste, MD  Cholecalciferol (VITAMIN D3) 1.25 MG (50000 UT) CAPS Vitamin D3   Yes [provider]  fluticasone (FLONASE) 50 MCG/ACT nasal spray Place 2 sprays into both nostrils daily. 12/09/17  Yes Weber, Sarah L, PA-C  hydrOXYzine (ATARAX/VISTARIL) 10 MG tablet Take 0.5-1 tablets (5-10 mg total) by mouth at bedtime as needed for anxiety (or sleep). 02/15/19  Yes Wendie Agreste, MD  omeprazole (PRILOSEC) 20 MG capsule Take 1 capsule (20 mg total) by mouth daily. 03/16/18  Yes Forrest Moron, MD  Prenatal Vit-Fe Fumarate-FA (PRENATAL VITAMIN) 27-0.8 MG TABS Prenatal Vitamin   Yes [provider]  sertraline (ZOLOFT) 100 MG tablet Take 1 tablet (100 mg total) by mouth daily. Start 1/2 pill for 1 week., then increase to full pill if tolerated. 01/30/19  Yes Wendie Agreste, MD   Social History   Socioeconomic History  . Marital status: Single    Spouse name: Not on file  . Number of children: 1  . Years of education: Not on file  . Highest education level: Not on file  Occupational History  . Not on file  Social Needs  . Financial resource strain: Not on file  . Food insecurity    Worry: Not on file    Inability: Not on file  . Transportation needs    Medical: Not on file    Non-medical: Not on file  Tobacco Use  . Smoking status: Never Smoker  . Smokeless tobacco: Never Used  Substance and Sexual Activity  . Alcohol use: Yes    Comment: occ  . Drug use: No  . Sexual activity: Yes    Partners: Male    Birth control/protection: Condom  Lifestyle  . Physical activity    Days per week: Not on file     Minutes per session: Not on file  . Stress: Not on file  Relationships  . Social Herbalist on phone: Not on file    Gets together: Not on file    Attends religious service: Not on file    Active member of club or organization: Not on file    Attends meetings of clubs or organizations: Not on file    Relationship status: Not on file  . Intimate partner violence    Fear of current or ex partner: Not  on file    Emotionally abused: Not on file    Physically abused: Not on file    Forced sexual activity: Not on file  Other Topics Concern  . 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  No home vitals obtained. There were no vitals filed for this visit.   Assessment and Plan: Depression with anxiety Improving.  Tolerating higher dose of Zoloft 100 mg, anticipate continued improvement in symptoms as that starts to work.  Option of 3 times daily dosing of BuSpar was discussed.  Will also try earlier dosing of hydroxyzine if needed for insomnia.  Recheck 2 weeks, continue follow-up with therapist.  RTC precautions if worse sooner.   Follow Up Instructions: 2 weeks.   Patient Instructions  Glad to hear things are going better. OK to take Buspar up to 3 times per day, continue Zoloft 111m per day. Hydroxyzine earlier in the evening if needed. Follow up in 2 weeks, but sooner if needed.      I discussed the assessment and treatment plan with the patient. The patient was provided an opportunity to ask questions and all were answered. The patient agreed with the plan and demonstrated an understanding of the instructions.   The patient was advised to call back or seek an in-person evaluation if the symptoms worsen or if the condition fails to improve as anticipated.  I  provided 18 minutes of non-face-to-face time during this encounter.  Signed,   JMerri Ray MD [facility]  03/02/19

## 2019-03-09 DIAGNOSIS — R69 Illness, unspecified: Secondary | ICD-10-CM | POA: Diagnosis not present

## 2019-03-13 ENCOUNTER — Telehealth: Payer: Self-pay | Admitting: Family Medicine

## 2019-03-13 NOTE — Telephone Encounter (Signed)
Pt is needing her fmla forms completed.  She states some of the paperwork is missing. She is planning on bringing another form in on 03/14/19.

## 2019-03-16 NOTE — Telephone Encounter (Signed)
Spoke with pt and she informed me that she has not dropped off form but will be bring it to the office when she has a free day for it to be resent correctly. We will call the pt hen the forms has been resent and confirm confirmation. She verbalized understanding.

## 2019-03-17 ENCOUNTER — Telehealth: Payer: Self-pay | Admitting: Family Medicine

## 2019-03-17 ENCOUNTER — Telehealth: Payer: Self-pay

## 2019-03-17 NOTE — Telephone Encounter (Signed)
Pt dropped off additional paperwork to requesting additional records. Place pprwork in provider box at [place]

## 2019-03-17 NOTE — Telephone Encounter (Signed)
A fax came through from [place] on behalf of the pt. for what appears to be something like FMLA for Dr. Carlota Raspberry.it is a series of form to be filled out by the dr. And faxed back to [phone].Placed in Dr. Vonna Kotyk Box

## 2019-03-17 NOTE — Telephone Encounter (Signed)
Paper work received and in the process of being completed.

## 2019-03-30 DIAGNOSIS — R69 Illness, unspecified: Secondary | ICD-10-CM | POA: Diagnosis not present

## 2019-04-25 DIAGNOSIS — R69 Illness, unspecified: Secondary | ICD-10-CM | POA: Diagnosis not present

## 2019-05-15 ENCOUNTER — Other Ambulatory Visit: Payer: Self-pay | Admitting: Family Medicine

## 2019-05-15 DIAGNOSIS — F418 Other specified anxiety disorders: Secondary | ICD-10-CM

## 2019-05-23 DIAGNOSIS — R69 Illness, unspecified: Secondary | ICD-10-CM | POA: Diagnosis not present

## 2019-06-06 ENCOUNTER — Other Ambulatory Visit: Payer: Self-pay | Admitting: Allergy and Immunology

## 2019-07-03 ENCOUNTER — Ambulatory Visit: Payer: 59 | Attending: Internal Medicine

## 2019-07-03 DIAGNOSIS — Z20822 Contact with and (suspected) exposure to covid-19: Secondary | ICD-10-CM | POA: Diagnosis not present

## 2019-07-04 LAB — NOVEL CORONAVIRUS, NAA: SARS-CoV-2, NAA: NOT DETECTED

## 2019-07-11 DIAGNOSIS — R69 Illness, unspecified: Secondary | ICD-10-CM | POA: Diagnosis not present

## 2019-08-02 ENCOUNTER — Other Ambulatory Visit: Payer: Self-pay | Admitting: Family Medicine

## 2019-08-02 DIAGNOSIS — F32A Depression, unspecified: Secondary | ICD-10-CM

## 2019-08-02 DIAGNOSIS — F329 Major depressive disorder, single episode, unspecified: Secondary | ICD-10-CM

## 2019-08-02 DIAGNOSIS — F418 Other specified anxiety disorders: Secondary | ICD-10-CM

## 2019-08-02 NOTE — Telephone Encounter (Signed)
Requested Prescriptions  Pending Prescriptions Disp Refills  . sertraline (ZOLOFT) 100 MG tablet [Pharmacy Med Name: SERTRALINE HCL 100 MG TABLET] 90 tablet 0    Sig: TAKE 1 TABLET BY MOUTH DAILY. START 1/2 PILL FOR 1 WEEK., THEN INCREASE TO FULL PILL IF TOLERATED.     Psychiatry:  Antidepressants - SSRI Passed - 08/02/2019  1:08 AM      Passed - Completed PHQ-2 or PHQ-9 in the last 360 days.      Passed - Valid encounter within last 6 months    Recent Outpatient Visits          5 months ago Depression with anxiety   [facility], Asencion Partridge, MD   5 months ago Depression with anxiety   [facility], Asencion Partridge, MD   5 months ago Depression with anxiety   [facility], Asencion Partridge, MD   5 months ago Depression with anxiety   [facility], Asencion Partridge, MD   6 months ago Depression, unspecified depression type   [facility], Asencion Partridge, MD

## 2019-08-07 ENCOUNTER — Other Ambulatory Visit: Payer: Self-pay | Admitting: Family Medicine

## 2019-08-07 DIAGNOSIS — F418 Other specified anxiety disorders: Secondary | ICD-10-CM

## 2019-08-22 DIAGNOSIS — R69 Illness, unspecified: Secondary | ICD-10-CM | POA: Diagnosis not present

## 2019-09-20 DIAGNOSIS — R69 Illness, unspecified: Secondary | ICD-10-CM | POA: Diagnosis not present

## 2019-11-01 DIAGNOSIS — R69 Illness, unspecified: Secondary | ICD-10-CM | POA: Diagnosis not present

## 2019-11-03 ENCOUNTER — Other Ambulatory Visit: Payer: Self-pay | Admitting: Family Medicine

## 2019-11-03 DIAGNOSIS — F32A Depression, unspecified: Secondary | ICD-10-CM

## 2019-11-03 DIAGNOSIS — F418 Other specified anxiety disorders: Secondary | ICD-10-CM

## 2019-11-03 NOTE — Telephone Encounter (Signed)
Due for office visit. Temporary refill granted with instructions to call and schedule visit.

## 2019-11-03 NOTE — Telephone Encounter (Signed)
Requested  medications are  due for refill today yes  Requested medications are on the active medication list yes  Last refill 4/12  Last visit 03/02/2019  Future visit scheduled no  Notes to clinic Failed protocol due to no visit within 6 months

## 2019-11-20 DIAGNOSIS — Z6841 Body Mass Index (BMI) 40.0 and over, adult: Secondary | ICD-10-CM | POA: Diagnosis not present

## 2019-11-20 DIAGNOSIS — R69 Illness, unspecified: Secondary | ICD-10-CM | POA: Diagnosis not present

## 2019-11-20 DIAGNOSIS — Z113 Encounter for screening for infections with a predominantly sexual mode of transmission: Secondary | ICD-10-CM | POA: Diagnosis not present

## 2019-11-20 DIAGNOSIS — Z3040 Encounter for surveillance of contraceptives, unspecified: Secondary | ICD-10-CM | POA: Diagnosis not present

## 2019-11-20 DIAGNOSIS — N912 Amenorrhea, unspecified: Secondary | ICD-10-CM | POA: Diagnosis not present

## 2019-11-20 DIAGNOSIS — Z01419 Encounter for gynecological examination (general) (routine) without abnormal findings: Secondary | ICD-10-CM | POA: Diagnosis not present

## 2019-12-13 DIAGNOSIS — R69 Illness, unspecified: Secondary | ICD-10-CM | POA: Diagnosis not present

## 2019-12-14 DIAGNOSIS — Z20822 Contact with and (suspected) exposure to covid-19: Secondary | ICD-10-CM | POA: Diagnosis not present

## 2019-12-14 DIAGNOSIS — Z03818 Encounter for observation for suspected exposure to other biological agents ruled out: Secondary | ICD-10-CM | POA: Diagnosis not present

## 2019-12-20 DIAGNOSIS — Z20822 Contact with and (suspected) exposure to covid-19: Secondary | ICD-10-CM | POA: Diagnosis not present

## 2020-01-10 DIAGNOSIS — N912 Amenorrhea, unspecified: Secondary | ICD-10-CM | POA: Diagnosis not present

## 2020-01-18 DIAGNOSIS — R69 Illness, unspecified: Secondary | ICD-10-CM | POA: Diagnosis not present

## 2020-01-22 DIAGNOSIS — Z30430 Encounter for insertion of intrauterine contraceptive device: Secondary | ICD-10-CM | POA: Diagnosis not present

## 2020-01-22 DIAGNOSIS — N840 Polyp of corpus uteri: Secondary | ICD-10-CM | POA: Diagnosis not present

## 2020-01-22 DIAGNOSIS — N939 Abnormal uterine and vaginal bleeding, unspecified: Secondary | ICD-10-CM | POA: Diagnosis not present

## 2020-02-02 DIAGNOSIS — Z20822 Contact with and (suspected) exposure to covid-19: Secondary | ICD-10-CM | POA: Diagnosis not present

## 2020-02-05 DIAGNOSIS — Z30431 Encounter for routine checking of intrauterine contraceptive device: Secondary | ICD-10-CM | POA: Diagnosis not present

## 2020-02-07 DIAGNOSIS — H5213 Myopia, bilateral: Secondary | ICD-10-CM | POA: Diagnosis not present

## 2020-02-07 DIAGNOSIS — Z0100 Encounter for examination of eyes and vision without abnormal findings: Secondary | ICD-10-CM | POA: Diagnosis not present

## 2020-02-10 DIAGNOSIS — Z713 Dietary counseling and surveillance: Secondary | ICD-10-CM | POA: Diagnosis not present

## 2020-02-10 DIAGNOSIS — R030 Elevated blood-pressure reading, without diagnosis of hypertension: Secondary | ICD-10-CM | POA: Diagnosis not present

## 2020-02-10 DIAGNOSIS — Z131 Encounter for screening for diabetes mellitus: Secondary | ICD-10-CM | POA: Diagnosis not present

## 2020-02-10 DIAGNOSIS — H6980 Other specified disorders of Eustachian tube, unspecified ear: Secondary | ICD-10-CM | POA: Diagnosis not present

## 2020-02-10 DIAGNOSIS — Z13220 Encounter for screening for lipoid disorders: Secondary | ICD-10-CM | POA: Diagnosis not present

## 2020-02-10 DIAGNOSIS — Z20822 Contact with and (suspected) exposure to covid-19: Secondary | ICD-10-CM | POA: Diagnosis not present

## 2020-02-13 DIAGNOSIS — R69 Illness, unspecified: Secondary | ICD-10-CM | POA: Diagnosis not present

## 2020-02-15 ENCOUNTER — Encounter (HOSPITAL_COMMUNITY): Payer: Self-pay

## 2020-02-15 ENCOUNTER — Emergency Department (HOSPITAL_COMMUNITY): Payer: No Typology Code available for payment source

## 2020-02-15 ENCOUNTER — Emergency Department (HOSPITAL_COMMUNITY)
Admission: EM | Admit: 2020-02-15 | Discharge: 2020-02-15 | Disposition: A | Discharge status: Home / Self Care | Payer: No Typology Code available for payment source | Attending: Emergency Medicine | Admitting: Emergency Medicine

## 2020-02-15 ENCOUNTER — Other Ambulatory Visit: Payer: Self-pay

## 2020-02-15 DIAGNOSIS — G932 Benign intracranial hypertension: Secondary | ICD-10-CM | POA: Diagnosis not present

## 2020-02-15 DIAGNOSIS — R42 Dizziness and giddiness: Secondary | ICD-10-CM | POA: Insufficient documentation

## 2020-02-15 DIAGNOSIS — R519 Headache, unspecified: Secondary | ICD-10-CM | POA: Insufficient documentation

## 2020-02-15 DIAGNOSIS — Z79899 Other long term (current) drug therapy: Secondary | ICD-10-CM | POA: Insufficient documentation

## 2020-02-15 DIAGNOSIS — H4710 Unspecified papilledema: Secondary | ICD-10-CM | POA: Insufficient documentation

## 2020-02-15 LAB — CBC
HCT: 44 % (ref 36.0–46.0)
Hemoglobin: 14.4 g/dL (ref 12.0–15.0)
MCH: 24.8 pg — ABNORMAL LOW (ref 26.0–34.0)
MCHC: 32.7 g/dL (ref 30.0–36.0)
MCV: 75.7 fL — ABNORMAL LOW (ref 80.0–100.0)
Platelets: 310 10*3/uL (ref 150–400)
RBC: 5.81 MIL/uL — ABNORMAL HIGH (ref 3.87–5.11)
RDW: 15 % (ref 11.5–15.5)
WBC: 10.9 10*3/uL — ABNORMAL HIGH (ref 4.0–10.5)
nRBC: 0 % (ref 0.0–0.2)

## 2020-02-15 LAB — BASIC METABOLIC PANEL
Anion gap: 11 (ref 5–15)
BUN: 12 mg/dL (ref 6–20)
CO2: 26 mmol/L (ref 22–32)
Calcium: 9.3 mg/dL (ref 8.9–10.3)
Chloride: 101 mmol/L (ref 98–111)
Creatinine, Ser: 0.78 mg/dL (ref 0.44–1.00)
GFR, Estimated: >60 mL/min (ref >60)
Glucose, Bld: 103 mg/dL — ABNORMAL HIGH (ref 70–99)
Potassium: 3.4 mmol/L — ABNORMAL LOW (ref 3.5–5.1)
Sodium: 138 mmol/L (ref 135–145)

## 2020-02-15 IMAGING — MR MR HEAD WO/W CM
23 of 25 series · 44 of 48 positions shown · IV contrast (gadavist)
Comparison: None available.

CLINICAL DATA: Initial evaluation for possible optic neuritis, pain
behind both eyes, swelling.

EXAM:
MRI HEAD AND ORBITS WITHOUT AND WITH CONTRAST
MRV HEAD WITHOUT AND WITH CONTRAST
TECHNIQUE: Multiplanar, multiecho pulse sequences of the brain and surrounding
structures were obtained without and with intravenous contrast.
Multiplanar, multiecho pulse sequences of the orbits and surrounding
structures were obtained including fat saturation techniques, before
and after intravenous contrast administration. Angiographic images
of the major dural sinuses using the standard protocol was also
performed prior to and after the administration of intravenous
contrast.
CONTRAST:  10mL GADAVIST GADOBUTROL 1 MMOL/ML IV SOLN

[Series 5: DWI · axial · 3.0mm · 1.36mm/px · z∈[-46,+113]mm · 2 of 107 slices shown (1 of 4)]
[im 1/107]
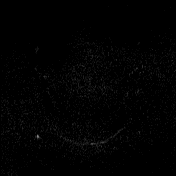
[im 107/107]
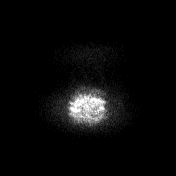

[Series 6: DWI · axial · 3.0mm · 1.36mm/px · 1 of 41 slices shown (2 of 4)]
[im 1/41]
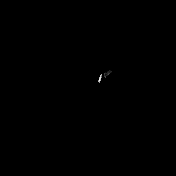

[Series 7: T1 · sagittal · 5.0mm · 0.75mm/px · 1 of 24 slices shown (1 of 4)]
[im 1/24]
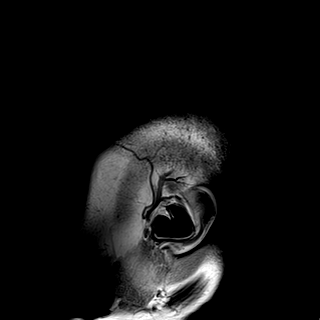

[Series 8: T2 · axial · 5.0mm · 0.60mm/px · 1 of 25 slices shown (1 of 2)]
[im 1/25]
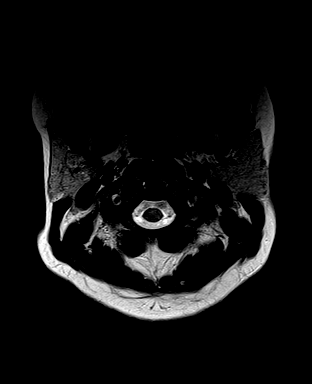

[Series 9: mip_images(sw) · axial · 24.0mm · 0.72mm/px · 1 of 49 slices shown]
[im 1/49]
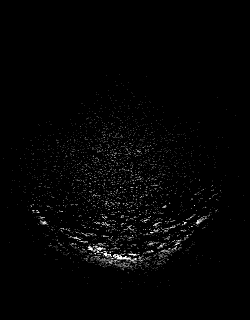

[Series 10: swi_images · axial · 3.0mm · 0.72mm/px · 1 of 56 slices shown]
[im 1/56]
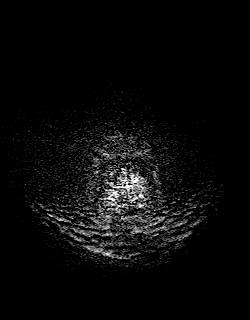

[Series 11: FLAIR · axial · 3.0mm · 0.72mm/px · z∈[-58,+100]mm · 2 of 54 slices shown]
[im 1/54]
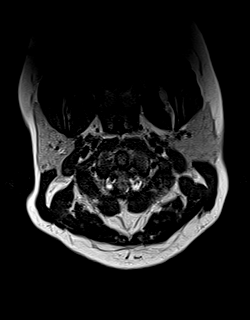
[im 54/54]
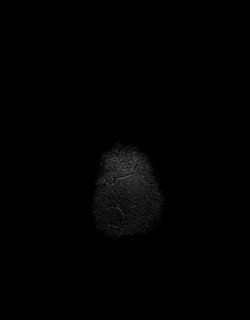

[Series 12: T1 · axial · 1.0mm · 0.90mm/px · z∈[-51,+107]mm · 5 of 160 slices shown (2 of 4)]
[im 1/160]
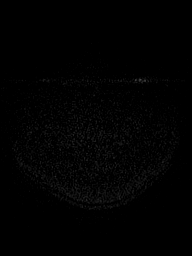
[im 40/160]
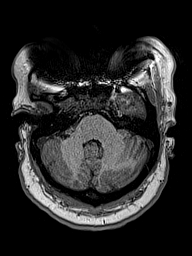
[im 80/160]
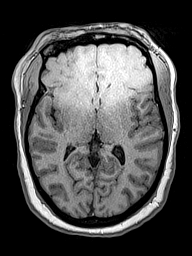
[im 120/160]
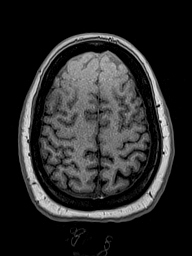
[im 160/160]
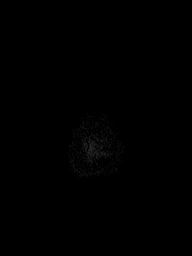

[Series 13: DWI · coronal · 5.0mm · 1.31mm/px · 2 of 75 slices shown (3 of 4)]
[im 1/75]
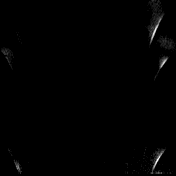
[im 75/75]
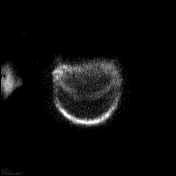

[Series 14: DWI · coronal · 5.0mm · 1.31mm/px · 1 of 30 slices shown (4 of 4)]
[im 1/30]
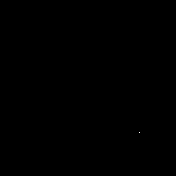

[Series 15: T2 · coronal · 5.0mm · 0.57mm/px · 1 of 29 slices shown (2 of 2)]
[im 1/29]
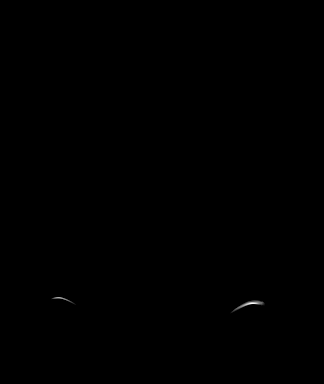

[Series 16: cor 2d · coronal · 2.5mm · 0.69mm/px · 3 of 116 slices shown]
[im 1/116]
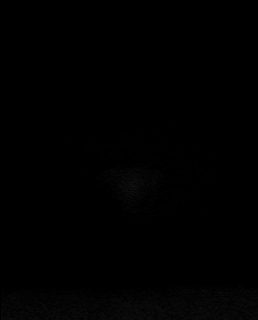
[im 58/116]
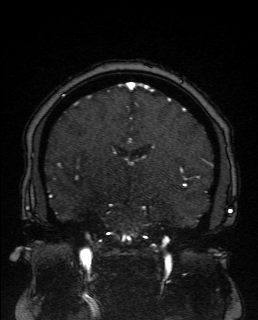
[im 116/116]
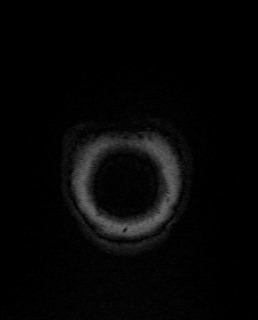

[Series 27: T2 fat-sat · axial · 3.0mm · 0.47mm/px · 1 of 17 slices shown (1 of 2)]
[im 1/17]
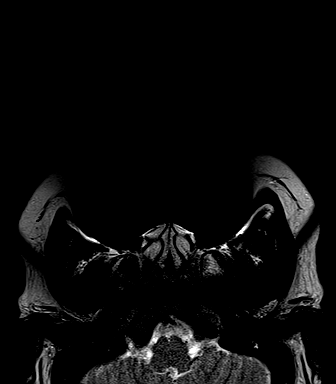

[Series 28: T1 · axial · 3.0mm · 0.56mm/px · 1 of 17 slices shown (3 of 4)]
[im 1/17]
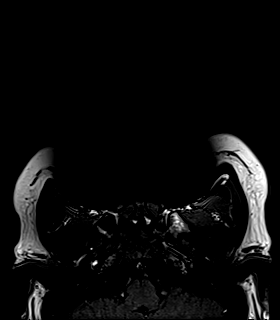

[Series 29: T2 fat-sat · coronal · 3.0mm · 0.47mm/px · 1 of 29 slices shown (2 of 2)]
[im 1/29]
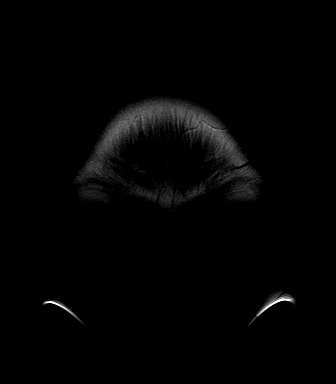

[Series 30: T1 · coronal · 3.0mm · 0.56mm/px · 1 of 29 slices shown (4 of 4)]
[im 1/29]
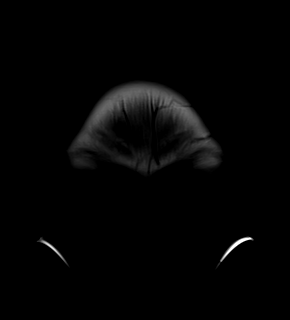

[Series 31: T1 post-contrast · axial · 1.0mm · 0.90mm/px · z∈[-51,+107]mm · 5 of 160 slices shown (1 of 3)]
[im 1/160]
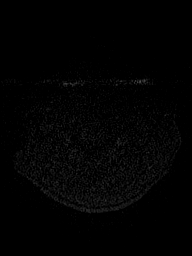
[im 40/160]
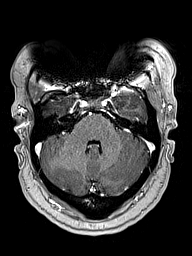
[im 80/160]
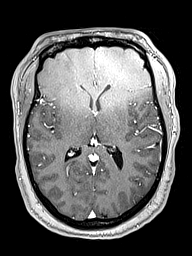
[im 120/160]
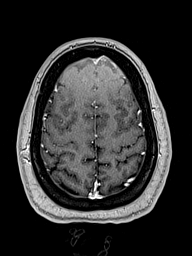
[im 160/160]
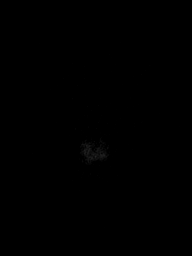

[Series 32: T1 fat-sat post-contrast · axial · 3.0mm · 0.56mm/px · 1 of 17 slices shown (1 of 2)]
[im 1/17]
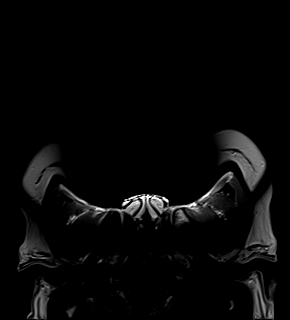

[Series 33: T1 fat-sat post-contrast · coronal · 3.0mm · 0.70mm/px · 1 of 29 slices shown (2 of 2)]
[im 1/29]
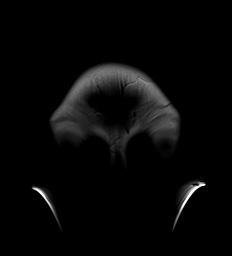

[Series 34: T1 post-contrast · coronal · 5.0mm · 0.43mm/px · 1 of 30 slices shown (2 of 3)]
[im 1/30]
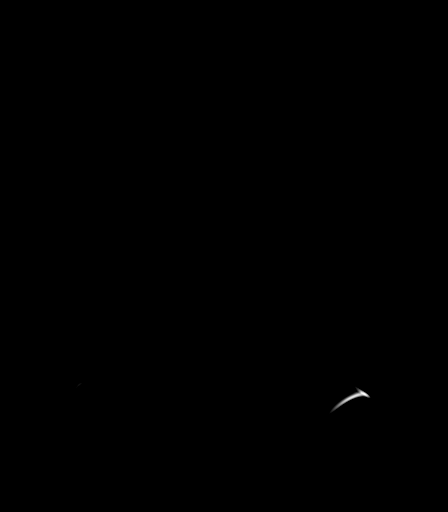

[Series 35: T1 post-contrast · sagittal · 5.0mm · 0.75mm/px · 1 of 24 slices shown (3 of 3)]
[im 1/24]
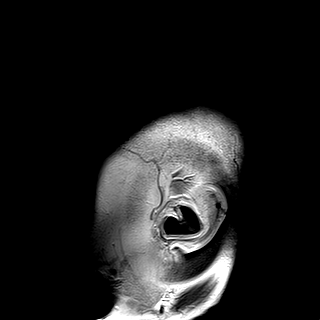

[Series 100: <mpr range> · axial · 1.0mm · 0.48mm/px · z∈[+27,+144]mm · 5 of 156 slices shown]
[im 1/156]
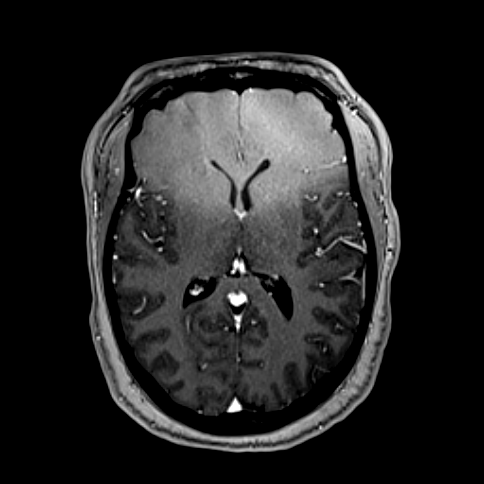
[im 39/156]
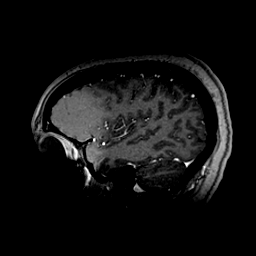
[im 78/156]
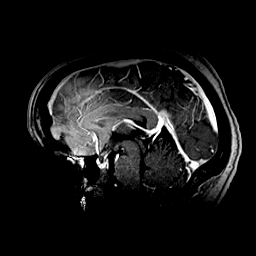
[im 117/156]
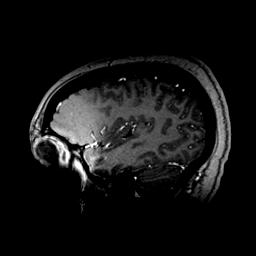
[im 156/156]
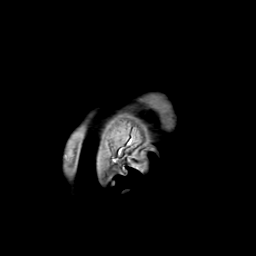

[Series 101: <mpr range(1)> · axial · 1.0mm · 0.48mm/px · z∈[+27,+167]mm · 5 of 161 slices shown]
[im 1/161]
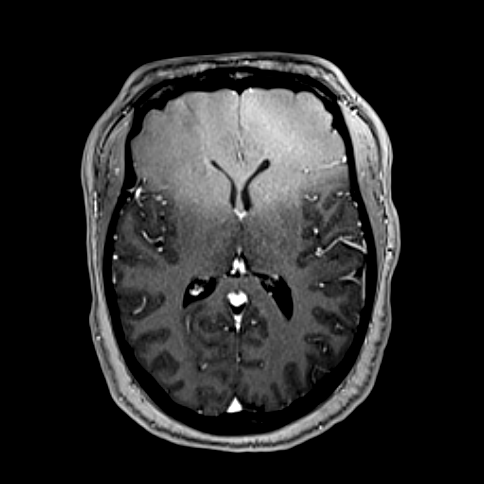
[im 41/161]
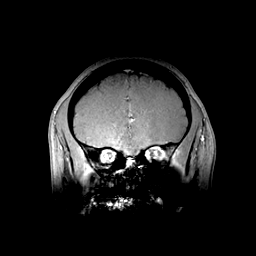
[im 81/161]
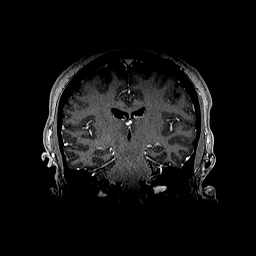
[im 121/161]
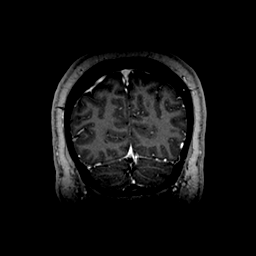
[im 161/161]
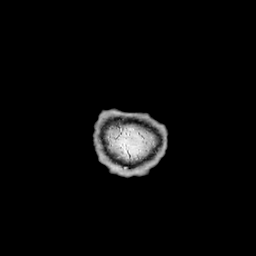

[44 of 48 positions shown; findings below may reference images not displayed]

FINDINGS: MRI HEAD FINDINGS

Brain: Examination technically limited by extensive susceptibility
artifact related to dental hardware.

Cerebral volume within normal limits. No focal parenchymal signal
abnormality. No definite abnormal foci of restricted diffusion to
suggest acute or subacute ischemia. Gray-white matter
differentiation maintained. No encephalomalacia to suggest chronic
cortical infarction. No visible foci of susceptibility artifact to
suggest acute or chronic intracranial hemorrhage.

No mass lesion, midline shift or mass effect. Ventricles normal size
without hydrocephalus. No extra-axial fluid collection.

Pituitary gland within normal limits. Midline structures intact. No
abnormal enhancement within the brain.

Vascular: Major intracranial vascular flow voids are maintained.

Skull and upper cervical spine: Craniocervical junction within
normal limits. Bone marrow signal intensity somewhat diffusely
decreased on T1 weighted imaging, nonspecific, but most commonly
related to anemia, smoking, or obesity. No focal marrow replacing
lesion. No scalp soft tissue abnormality.

Other: No mastoid effusion.  Inner ear structures grossly normal.

MRI ORBITS FINDINGS

Orbits: Evaluation of the globes and orbital soft tissues limited by
extensive susceptibility artifact. Globes fairly symmetric in size
with grossly normal morphology. Subtle bulging of the optic discs
seen at the posterior aspects of both globes, left more prominent
than right (series 27, image 9), consistent with papilledema.
Evaluation of the optic nerve sheaths limited by susceptibility
artifact. No definite intrinsic optic nerve edema or enhancement to
suggest acute optic neuritis. No appreciable abnormality along
either optic nerve sheath. Orbital apices within normal limits.
Optic chiasm normally situated within the suprasellar cistern. No
suprasellar mass or other structure abnormality. No visible
abnormality about the cavernous sinus.

Intraconal and extraconal fat well-maintained. Extra-ocular muscles
symmetric and normal. Lacrimal glands grossly within normal limits.
Superior orbital veins symmetric and within normal limits.

Visualized sinuses: Partially visualized sinuses are grossly clear,
although evaluation limited by susceptibility artifact.

Soft tissues: Unremarkable.

MRV HEAD FINDINGS

Normal flow related signal and enhancement seen throughout the
superior sagittal sinus to the level of the torcula. Torcula itself
appears patent. Transverse sinus is widely patent proximally.
Suggestion of focal stenoses involving the distal transverse sinuses
bilaterally. Sigmoid sinus is widely patent as are the visualized
proximal internal jugular veins. Left transverse sinus dominant.
Straight sinus, vein of TABI, internal cerebral veins, and basal
veins of TABI are patent. No evidence for dural sinus
thrombosis.
IMPRESSION: 1. Technically limited exam due to extensive susceptibility artifact
related to dental hardware.
2. Subtle bulging of the optic discs at the posterior aspects of
both globes, left more prominent than right, consistent with
papilledema. Probable focal stenoses involving the distal transverse
sinuses bilaterally. Constellation of findings can be seen in the
setting of idiopathic intracranial hypertension. Correlation with LP
and opening pressures could be performed for further evaluation as
warranted.
3. Otherwise normal MRI of the brain and orbits. No other acute
abnormality identified. No definite evidence for acute optic
neuritis.
4. Otherwise normal intracranial MRV. No evidence for dural sinus
thrombosis.

## 2020-02-15 MED ORDER — LORAZEPAM 2 MG/ML IJ SOLN
1.0000 mg | Freq: Once | INTRAMUSCULAR | Status: AC
  Administered 2020-02-15: 1 mg via INTRAVENOUS
  Filled 2020-02-15: qty 1

## 2020-02-15 MED ORDER — GADOBUTROL 1 MMOL/ML IV SOLN
10.0000 mL | Freq: Once | INTRAVENOUS | Status: AC | PRN
  Administered 2020-02-15: 10 mL via INTRAVENOUS

## 2020-02-15 MED ORDER — ACETAZOLAMIDE ER 500 MG PO CP12
500.0000 mg | ORAL_CAPSULE | Freq: Two times a day (BID) | ORAL | 1 refills | Status: DC
Start: 2020-02-15 — End: 2020-03-19

## 2020-02-15 MED ORDER — LIDOCAINE HCL (PF) 1 % IJ SOLN
5.0000 mL | Freq: Once | INTRAMUSCULAR | Status: AC
  Administered 2020-02-15: 5 mL
  Filled 2020-02-15: qty 30

## 2020-02-15 MED ORDER — ACETAZOLAMIDE 250 MG PO TABS
500.0000 mg | ORAL_TABLET | Freq: Once | ORAL | Status: AC
  Administered 2020-02-15: 500 mg via ORAL
  Filled 2020-02-15: qty 2

## 2020-02-15 NOTE — ED Provider Notes (Addendum)
Saw optho, B/L disk edema. Sent for MRI. H/A one month.  Physical Exam  BP (!) 139/91   Pulse 93   Temp 98.2 F (36.8 C) (Oral)   Resp 18   Ht 5\' 3"  (1.6 m)   Wt 111.1 kg   LMP 01/06/2020   SpO2 100%   BMI 43.40 kg/m   Physical Exam  ED Course/Procedures     .Lumbar Puncture  Date/Time: 02/15/2020 10:30 PM Performed by: 02/17/2020, MD Authorized by: Arby Barrette, MD   Consent:    Consent obtained:  Verbal   Consent given by:  Patient   Risks discussed:  Bleeding, infection, pain, repeat procedure, nerve damage and headache   Alternatives discussed:  Delayed treatment Pre-procedure details:    Procedure purpose:  Diagnostic   Preparation: Patient was prepped and draped in usual sterile fashion   Sedation:    Sedation type:  Anxiolysis Anesthesia (see MAR for exact dosages):    Anesthesia method:  Local infiltration   Local anesthetic:  Lidocaine 1% w/o epi Procedure details:    Lumbar space:  L4-L5 interspace   Patient position:  L lateral decubitus   Needle gauge:  22   Needle type:  Spinal needle - Quincke tip   Needle length (in):  5.0   Ultrasound guidance: no     Number of attempts:  2   Opening pressure (cm H2O):  42   Fluid appearance:  Blood-tinged then clearing   Tubes of fluid:  4   Total volume (ml):  5 Post-procedure:    Puncture site:  Adhesive bandage applied   Patient tolerance of procedure:  Tolerated well, no immediate complications    MDM   Consult: Reviewed with Dr. Arby Barrette.  Recommends proceeding with LP.  If pressure greater than Amada Jupiter, start acetazolamide 500 twice daily.  Patient can follow-up with neurology.  Recheck: 11: 10.  Patient has been resting quietly.  She reports headache is improved.  We reviewed the plan for acetazolamide and follow-up with neurology ASAP.  Return precautions reviewed.    , MD 02/15/20 02/17/20    [id number], MD 02/15/20 [id number]

## 2020-02-15 NOTE — ED Notes (Signed)
PT transported to MRI via stretcher.

## 2020-02-15 NOTE — ED Triage Notes (Signed)
Patient was at her ophthamologist today and was noted that she had right florid disc edema. Notes sent by patient suggested MRI Patient has had new onsets of headache, ear infection, prednisone use, and recent birth control

## 2020-02-15 NOTE — Discharge Instructions (Signed)
1.  Take acetazolamide twice daily as prescribed.  Take Tylenol every 6 hours as needed for headache. 2.  Call [facility] tomorrow morning to schedule appointment as soon as possible for idiopathic intracranial hypertension. 3.  Return to emergency department immediately if you have any worsening changes in your vision, increasing headache, imbalance in your gait, nausea and vomiting or other concerning symptoms.

## 2020-02-15 NOTE — ED Provider Notes (Signed)
[facility]-EMERGENCY DEPT Provider Note   CSN: [id number] Arrival date & time: 02/15/20  1226     History Chief Complaint  Patient presents with  . Eye Problem    Brenda Molina is a 34 y.o. female.  HPI Patient sent in from ophthalmology.  Has bilateral optic disc edema.  Over the last month she has had headaches.  Reportedly had an ear infection had been on some oral prednisone.  Also was on birth control.  Ophthalmologist concern for central venous sinus thrombosis versus mass versus idiopathic intracranial hypertension.  Recommended MRI of the brain with and without contrast MRI of the orbits with and without contrast and MRV.  Potentially would also need LP after this.  Patient states the headaches are dull.  Come and go.  Tend to be diffuse over the head.  At times has some blurred visions.  States she does not have difficulty walking with a times will feel lightheaded when she attempts to stand.  Also at times will see some floaters.    Past Medical History:  Diagnosis Date  . Allergy   . Anxiety   . PCOS (polycystic ovarian syndrome)     Patient Active Problem List   Diagnosis Date Noted  . Perennial and seasonal allergic rhinitis 04/18/2018  . Cough, persistent 04/18/2018  . History of wheezing 04/18/2018  . 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 Normal vaginal delivery 06/06/2015  . RhD negative 06/05/2015  . Preterm premature rupture of membranes 06/05/2015    Past Surgical History:  Procedure Laterality Date  . WISDOM TOOTH EXTRACTION       OB History    Gravida  1   Para  1   Term      Preterm  1   AB      Living  1     SAB      TAB      Ectopic      Multiple  0   Live Births  1           Family History  Problem Relation Age of Onset  . Asthma Father   . Hypertension Father     Social History   Tobacco Use  . Smoking status: Never Smoker  . Smokeless tobacco: Never Used  Vaping Use  . Vaping Use:  Never used  Substance Use Topics  . Alcohol use: Yes    Comment: occ  . Drug use: No    Home Medications Prior to Admission medications   Medication Sig Start Date End Date Taking? Authorizing Provider  acetaminophen (TYLENOL) 650 MG CR tablet Take 650 mg by mouth every 8 (eight) hours as needed for pain.   Yes [provider]  albuterol (PROVENTIL HFA;VENTOLIN HFA) 108 (90 Base) MCG/ACT inhaler Inhale 2 puffs into the lungs every 6 (six) hours as needed (for cough). Patient taking differently: Inhale 2 puffs into the lungs every 6 (six) hours as needed for wheezing or shortness of breath.  03/16/18  Yes Stallings, Zoe A, MD  carboxymethylcellulose (REFRESH PLUS) 0.5 % SOLN Place 1 drop into both eyes 3 (three) times daily as needed (dry eyes).   Yes [provider]  levonorgestrel (MIRENA, 52 MG,) 20 MCG/24HR IUD 1 each by Intrauterine route once.    Yes [provider]  predniSONE (DELTASONE) 10 MG tablet Take 10 mg by mouth in the morning, at noon, and at bedtime.    Yes [provider]  sertraline (ZOLOFT) 100  MG tablet TAKE 1 TABLET BY MOUTH DAILY. START 1/2 PILL FOR 1 WEEK., THEN INCREASE TO FULL PILL IF TOLERATED. Patient taking differently: Take 100 mg by mouth daily as needed (anxiety).  11/03/19  Yes Shade Flood, MD  albuterol (PROVENTIL HFA;VENTOLIN HFA) 108 [phone] Base) MCG/ACT inhaler May use 1-2 spray every 4-6 hours as needed for cough,wheeze and shortness of breathe Patient not taking: Reported on 02/15/2020 04/18/18   Bobbitt, Heywood Iles, MD  amoxicillin-clavulanate (AUGMENTIN) 875-125 MG tablet Take 1 tablet by mouth 2 (two) times daily. Start date :02/02/20    [provider]  busPIRone (BUSPAR) 7.5 MG tablet TAKE 1 TABLET BY MOUTH TWICE A DAY Patient taking differently: Take 7.5 mg by mouth daily as needed (for anxiety).  11/03/19   Shade Flood, MD  DYMISTA 137-50 MCG/ACT SUSP USE 1 SPRAY PER NOSTRIL TWICE DAILY AS  NEEDED Patient not taking: Reported on 02/15/2020 06/06/19   Jackquline Bosch, MD  fluticasone (FLONASE) 50 MCG/ACT nasal spray Place 2 sprays into both nostrils daily. 12/09/17   Valarie Cones, Dema Severin, PA-C  hydrOXYzine (ATARAX/VISTARIL) 10 MG tablet Take 0.5-1 tablets (5-10 mg total) by mouth at bedtime as needed for anxiety (or sleep). Patient not taking: Reported on 02/15/2020 02/15/19   Shade Flood, MD  omeprazole (PRILOSEC) 20 MG capsule Take 1 capsule (20 mg total) by mouth daily. Patient not taking: Reported on 02/15/2020 03/16/18   Doristine Bosworth, MD    Allergies    Patient has no known allergies.  Review of Systems   Review of Systems  Constitutional: Negative for appetite change.  Eyes: Positive for visual disturbance.  Respiratory: Negative for shortness of breath.   Gastrointestinal: Negative for abdominal pain.  Genitourinary: Negative for enuresis.  Musculoskeletal: Negative for back pain.  Skin: Negative for rash.  Neurological: Positive for dizziness and headaches. Negative for weakness.  Psychiatric/Behavioral: Negative for confusion.    Physical Exam Updated Vital Signs BP (!) 139/91   Pulse 93   Temp 98.2 F (36.8 C) (Oral)   Resp 18   Ht 5\' 3"  (1.6 m)   Wt 111.1 kg   LMP 01/06/2020   SpO2 100%   BMI 43.40 kg/m   Physical Exam Vitals and nursing note reviewed.  Constitutional:      Appearance: She is obese.  HENT:     Head: Normocephalic and atraumatic.  Eyes:     Extraocular Movements: Extraocular movements intact.     Pupils: Pupils are equal, round, and reactive to light.  Cardiovascular:     Rate and Rhythm: Regular rhythm.  Pulmonary:     Breath sounds: No wheezing or rhonchi.  Abdominal:     Palpations: Abdomen is soft.  Musculoskeletal:     Cervical back: Neck supple.     Right lower leg: No edema.     Left lower leg: No edema.  Skin:    General: Skin is warm.     Capillary Refill: Capillary refill takes less than 2 seconds.   Neurological:     Mental Status: She is alert and oriented to person, place, and time.     Comments: Awake and appropriate answers questions normally.  Appears to be moving all extremities.     ED Results / Procedures / Treatments   Labs (all labs ordered are listed, but only abnormal results are displayed) Labs Reviewed  BASIC METABOLIC PANEL - Abnormal; Notable for the following components:      Result Value  Potassium 3.4 (*)    Glucose, Bld 103 (*)    All other components within normal limits  CBC - Abnormal; Notable for the following components:   WBC 10.9 (*)    RBC 5.81 (*)    MCV 75.7 (*)    MCH 24.8 (*)    All other components within normal limits    EKG None  Radiology No results found.  Procedures Procedures (including critical care time)  Medications Ordered in ED Medications - No data to display  ED Course  I have reviewed the triage vital signs and the nursing notes.  Pertinent labs & imaging results that were available during my care of the patient were reviewed by me and considered in my medical decision making (see chart for details).    MDM Rules/Calculators/A&P                                      Patient with headache.  Sent in by ophthalmology.  Lab work reassuring.  MRI is ordered as per ophthalmology request.  But potentially would need lumbar puncture after MRIs but potentially could be done as an outpatient also.  Care turned over to Dr. Clarice Pole. Final Clinical Impression(s) / ED Diagnoses Final diagnoses:  None    Rx / DC Orders ED Discharge Orders    None       Benjiman Core, MD 02/15/20 1651

## 2020-02-16 LAB — CSF CELL COUNT WITH DIFFERENTIAL
RBC Count, CSF: 217 /mm3 — ABNORMAL HIGH
RBC Count, CSF: 973 /mm3 — ABNORMAL HIGH
Tube #: 1
Tube #: 4
WBC, CSF: 1 /mm3 (ref 0–5)
WBC, CSF: 7 /mm3 — ABNORMAL HIGH (ref 0–5)

## 2020-02-16 LAB — PROTEIN, CSF: Total  Protein, CSF: 18 mg/dL (ref 15–45)

## 2020-02-16 LAB — GLUCOSE, CSF: Glucose, CSF: 67 mg/dL (ref 40–70)

## 2020-02-20 ENCOUNTER — Encounter: Payer: Self-pay | Admitting: Neurology

## 2020-02-20 ENCOUNTER — Ambulatory Visit (INDEPENDENT_AMBULATORY_CARE_PROVIDER_SITE_OTHER): Payer: No Typology Code available for payment source | Admitting: Neurology

## 2020-02-20 VITALS — BP 131/88 | HR 96 | Ht 63.0 in | Wt 241.0 lb

## 2020-02-20 DIAGNOSIS — R0683 Snoring: Secondary | ICD-10-CM

## 2020-02-20 DIAGNOSIS — H4711 Papilledema associated with increased intracranial pressure: Secondary | ICD-10-CM | POA: Diagnosis not present

## 2020-02-20 DIAGNOSIS — G932 Benign intracranial hypertension: Secondary | ICD-10-CM | POA: Diagnosis not present

## 2020-02-20 DIAGNOSIS — R519 Headache, unspecified: Secondary | ICD-10-CM

## 2020-02-20 DIAGNOSIS — R5383 Other fatigue: Secondary | ICD-10-CM | POA: Diagnosis not present

## 2020-02-20 DIAGNOSIS — R112 Nausea with vomiting, unspecified: Secondary | ICD-10-CM

## 2020-02-20 MED ORDER — ONDANSETRON HCL 4 MG PO TABS: 4.0000 mg | ORAL_TABLET | Freq: Three times a day (TID) | ORAL | 1 refills | Status: DC | PRN

## 2020-02-20 NOTE — Progress Notes (Signed)
Subjective:    Patient ID: Brenda Molina is a 34 y.o. female.  HPI     Huston Foley, MD, PhD [facility] [address] [address] [place], [place] [address]  I saw patient, Brenda Molina, as a referral from the ED for further management of her recent diagnosis of idiopathic intracranial hypertension.  The patient is unaccompanied today.  Brenda Molina is a 34 year old right-handed woman with an underlying medical history of PCOS, anxiety, allergies, and severe obesity with a BMI of over 40, who was sent to the emergency room on 02/15/2020 after an eye examination.  She reported headaches and blurry vision.  Her ophthalmology exam revealed florid bilateral papilledema.  I reviewed emergency room records.  Ophthalmology records were also scanned in from her visit to [facility].  In the emergency room she had multiple scans including brain MRI with and without contrast, MR orbits with and without contrast and MRV head with and without contrast.  I reviewed the results: IMPRESSION: 1. Technically limited exam due to extensive susceptibility artifact related to dental hardware. 2. Subtle bulging of the optic discs at the posterior aspects of both globes, left more prominent than right, consistent with papilledema. 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 Constellation of findings can be seen in the setting of idiopathic intracranial hypertension. Correlation with LP and opening pressures could be performed for further evaluation as warranted. 3. Otherwise normal MRI of the brain and orbits. No other acute abnormality identified. No definite evidence for acute optic neuritis. 4. Otherwise normal intracranial MRV. No evidence for dural sinus thrombosis.  He also had a lumbar puncture in the emergency room with an opening pressure elevated at 42.  Routine laboratory results were obtained including routine tests on her CSF  which initially was blood-tinged with an RBC count of 973, WBC count of 7 with subsequent improvement in tube 4.  Protein in the CSF was 18, glucose was 67.  She was started on Diamox in the ER.  She reports that she has noticed nausea since starting the Diamox.  She is taking 500 mg twice daily.  She had occasional retching and brought up mucus.  She had improvement in her headache but now she has a pressure sensation and still has blurry vision and light sensitivity.  She has not noticed any new symptoms.  She has not made a follow-up appointment yet with her ophthalmologist, Dr. Georges Mouse.  She was told to follow-up in 1 month.  She does not have any medication for nausea. She reports some snoring and sleep disruption and feels tired during the day.  He has never had a sleep study.  She lives with her 35-year-old daughter.  Her Epworth sleepiness score is 2 out of 24, fatigue severity score is 51 out of 63.  She goes to bed around 830 and rise time is between 6:15 AM and 7 AM.  She went to urgent care in early October around the eighth and was told she had a sinus and ear infection was treated with antibiotic treatment.  She went back to urgent care about a week later and was given a prescription for prednisone.  Her Past Medical History Is Significant For: Past Medical History:  Diagnosis Date  . Allergy   . Anxiety   . PCOS (polycystic ovarian syndrome)     Her Past Surgical History Is Significant For: Past Surgical History:  Procedure Laterality Date  . WISDOM TOOTH EXTRACTION  Her Family History Is Significant For: Family History  Problem Relation Age of Onset  . Asthma Father   . Hypertension Father     Her Social History Is Significant For: Social History   Socioeconomic History  . Marital status: Single    Spouse name: Not on file  . Number of children: 1  . Years of education: Not on file  . Highest education level: Not on file  Occupational History  . Not  on file  Tobacco Use  . Smoking status: Never Smoker  . Smokeless tobacco: Never Used  Vaping Use  . Vaping Use: Never used  Substance and Sexual Activity  . Alcohol use: Yes    Comment: occ  . Drug use: No  . Sexual activity: Yes    Partners: Male    Birth control/protection: Condom  Other Topics Concern  . 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 Has no children. 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 Difficulty of Paying Living Expenses: Not on file  Food Insecurity:   . Worried About Programme researcher, broadcasting/film/video in the Last Year: Not on file  . Ran Out of Food in the Last Year: Not on file  Transportation Needs:   . Lack of Transportation (Medical): Not on file  . Lack of Transportation (Non-Medical): Not on file  Physical Activity:   . Days of Exercise per Week: Not on file  . Minutes of Exercise per Session: Not on file  Stress:   . Feeling of Stress : Not on file  Social Connections:   . Frequency of Communication with Friends and Family: Not on file  . Frequency of Social Gatherings with Friends and Family: Not on file  . Attends Religious Services: Not on file  . Active Member of Clubs or Organizations: Not on file  . Attends Banker Meetings: Not on file  . Marital Status: Not on file    Her Allergies Are:  No Known Allergies:   Her Current Medications Are:  Outpatient Encounter Medications as of 02/20/2020  Medication Sig  . acetaminophen (TYLENOL) 650 MG CR tablet Take 650 mg by mouth every 8 (eight) hours as needed for pain.  Marland Kitchen acetaZOLAMIDE (DIAMOX) 500 MG capsule Take 1 capsule (500 mg total) by mouth 2 (two) times daily.  . busPIRone (BUSPAR) 7.5 MG tablet TAKE 1 TABLET BY MOUTH TWICE A DAY (Patient taking differently: Take 7.5 mg by mouth daily as needed (for anxiety). )  .  carboxymethylcellulose (REFRESH PLUS) 0.5 % SOLN Place 1 drop into both eyes 3 (three) times daily as needed (dry eyes).  . fluticasone (FLONASE) 50 MCG/ACT nasal spray Place 2 sprays into both nostrils daily.  . hydrOXYzine (ATARAX/VISTARIL) 10 MG tablet Take 0.5-1 tablets (5-10 mg total) by mouth at bedtime as needed for anxiety (or sleep).  Marland Kitchen levonorgestrel (MIRENA, 52 MG,) 20 MCG/24HR IUD 1 each by Intrauterine route once.   Marland Kitchen omeprazole (PRILOSEC) 20 MG capsule Take 1 capsule (20 mg total) by mouth daily.  . sertraline (ZOLOFT) 100 MG tablet TAKE 1 TABLET BY MOUTH DAILY. START 1/2 PILL FOR 1 WEEK., THEN INCREASE TO FULL PILL IF TOLERATED. (Patient taking differently: Take 100 mg by mouth daily as needed (anxiety). )  . [DISCONTINUED] albuterol (PROVENTIL HFA;VENTOLIN HFA) 108 (90 Base) MCG/ACT  inhaler Inhale 2 puffs into the lungs every 6 (six) hours as needed (for cough). (Patient taking differently: Inhale 2 puffs into the lungs every 6 (six) hours as needed for wheezing or shortness of breath. )  . [DISCONTINUED] albuterol (PROVENTIL HFA;VENTOLIN HFA) 108 (90 Base) MCG/ACT inhaler May use 1-2 spray every 4-6 hours as needed for cough,wheeze and shortness of breathe (Patient not taking: Reported on 02/15/2020)  . [DISCONTINUED] amoxicillin-clavulanate (AUGMENTIN) 875-125 MG tablet Take 1 tablet by mouth 2 (two) times daily. Start date :02/02/20  . [DISCONTINUED] DYMISTA 137-50 MCG/ACT SUSP USE 1 SPRAY PER NOSTRIL TWICE DAILY AS NEEDED (Patient not taking: Reported on 02/15/2020)  . [DISCONTINUED] predniSONE (DELTASONE) 10 MG tablet Take 10 mg by mouth in the morning, at noon, and at bedtime.    No facility-administered encounter medications on file as of 02/20/2020.  :   Review of Systems:  Out of a complete 14 point review of systems, all are reviewed and negative with the exception of these symptoms as listed below:  Review of Systems  Neurological:       Pt presents today to discuss  her headaches. Over the past month her headaches have worsened. She has a constant headache with some light sensitivity and nausea. Pt believes diamox also may be making her feel nauseated. Pt has never had a sleep study. Pt does endorse occasional snoring.  Epworth Sleepiness Scale 0= would never doze 1= slight chance of dozing 2= moderate chance of dozing 3= high chance of dozing  Sitting and reading: 0 Watching TV: 1 Sitting inactive in a public place (ex. Theater or meeting): 0 As a passenger in a car for an hour without a break: 0 Lying down to rest in the afternoon: 1 Sitting and talking to someone: 0 Sitting quietly after lunch (no alcohol): 0 In a car, while stopped in traffic: 0 Total: 2     Objective:  Neurological Exam  Physical Exam Physical Examination:   Vitals:   02/20/20 1022  BP: 131/88  Pulse: 96    General Examination: The patient is a very pleasant 34 y.o. female in no acute distress. She appears well-developed and well-nourished and well groomed.   HEENT: Normocephalic, atraumatic, pupils are equal, round and reactive to light and accommodation. Funduscopic exam is normal with hazy disc margins, funduscopic exam is difficult as she does have photophobia and has frequent eye movements and blinking.  Extraocular tracking otherwise is well preserved, face is symmetric with normal facial animation and normal facial sensation, hearing grossly intact.   Speech is clear with no dysarthria noted. There is no hypophonia. There is no lip, neck/head, jaw or voice tremor. Neck is supple with full range of passive and active motion. There are no carotid bruits on auscultation. Oropharynx exam reveals: mild mouth dryness, good dental hygiene and moderate airway crowding, due to small airway entry, uvula and tonsils not fully visualized, Mallampati class III, neck circumference of 16 inches.  Tongue protrudes centrally in palate elevates symmetrically.  Chest: Clear to  auscultation without wheezing, rhonchi or crackles noted.  Heart: S1+S2+0, regular and normal without murmurs, rubs or gallops noted.   Abdomen: Soft, non-tender and non-distended with normal bowel sounds appreciated on auscultation.  Extremities: There is no pitting edema in the distal lower extremities bilaterally. Pedal pulses are intact.  Skin: Warm and dry without trophic changes noted.  Musculoskeletal: exam reveals no obvious joint deformities, tenderness or joint swelling or erythema.   Neurologically:  Mental status: The  patient is awake, alert and oriented in all 4 spheres. Her immediate and remote memory, attention, language skills and fund of knowledge are appropriate. There is no evidence of aphasia, agnosia, apraxia or anomia. Speech is clear with normal prosody and enunciation. Thought process is linear. Mood is normal and affect is normal.  Cranial nerves II - XII are as described above under HEENT exam. In addition: shoulder shrug is normal with equal shoulder height noted. Motor exam: Normal bulk, strength and tone is noted. There is no drift, tremor or rebound. Romberg is negative. Reflexes are 2+ throughout. Babinski: Toes are flexor bilaterally. Fine motor skills and coordination: intact with normal finger taps, normal hand movements, normal rapid alternating patting, normal foot taps and normal foot agility.  Cerebellar testing: No dysmetria or intention tremor on finger to nose testing. Heel to shin is unremarkable bilaterally. There is no truncal or gait ataxia.  Sensory exam: intact to light touch, vibration and temperature sense in the upper and lower extremities.  Gait, station and balance: She stands easily. No veering to one side is noted. No leaning to one side is noted. Posture is age-appropriate and stance is narrow based. Gait shows normal stride length and normal pace. No problems turning are noted. Tandem walk is unremarkable. Intact toe and heel stance is noted.                Assessment and Plan:   In summary, Brenda Molina is a very pleasant 34 y.o.-year old female  with an underlying medical history of PCOS, anxiety, allergies, and severe obesity with a BMI of over 40, who presents for evaluation of her pseudotumor cerebri, recent diagnosis of idiopathic intracranial hypertension after being diagnosed with bilateral florid papilledema by her ophthalmologist.  She went to the emergency room last week.  She had extensive imaging tests.  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  She had a lumbar puncture which showed an opening pressure at 42 cm.  She was started on Diamox and is encouraged to continue with this.  We talked about the importance of regular eye examinations and checkup for papilledema as papilledema can lead to permanent vision loss in patients with suboptimally treated pseudotumor cerebri.  I would like to proceed with sleep study to rule out underlying sleep disordered breathing.  If she has sleep apnea, I would like to proceed with treatment for this as well.  She has had nausea with the Diamox.  We may be able to add a secondary medication such as Topamax down the road but she recently just started the Diamox and is still adjusting to this medication.  She is encouraged to try Zofran as needed for nausea.  She is advised to follow-up in roughly a month for a recheck on her symptoms.  We will keep her posted as to her sleep study scheduling and results in the interim by phone call.  I answered all her questions today and she was in agreement with the plan.  Huston FoleySaima Dorathy Stallone, MD, PhD

## 2020-02-20 NOTE — Patient Instructions (Addendum)
You have a condition called pseudotumor cerebri, which means that there increased fluid pressure around your brain. 1.  You had appropriate testing including brain scans called MRI brain and MR venogram as well as MRI of the orbits, and you have had a lumbar puncture which did show increased pressure.   2. You will need ongoing eye exams on a regular basis. Please Follow up with your eye doctor to monitor improvement in your papilledema.  Please make an appointment with Dr. Georges Mouse. 3. We may request another LP with pressure testing and routine fluid testing, if needed down the road.  4. We may increase your diamox to help keep your spinal fluid pressure at bay. We will check the level in your blood from time to time.  We may also consider a secondary medication such as Topamax. 5. As you know, your vision and visual field can be affected. The most serious complication of having pseudotumor cerebri is loss of vision, which can be permanent. 6. In some rare cases we need to consider a shunt to save eye sight and permanently reduce the pressure behind the eyes.  7.  I would like to proceed with a sleep study to rule out underlying sleep apnea which can exacerbate your idiopathic intracranial hypertension or pseudotumor cerebri.  8. I will make a referral to interventional radiology for evaluation of narrowing of the blood and fluid drainage system in your brain (ie what is called stenosis of the transverse sinuses).  9.  I will prescribe Zofran for nausea and vomiting. 10.  Please follow-up in 1 month in our clinic.  Call us with any interim questions or concerns.

## 2020-02-22 ENCOUNTER — Emergency Department (HOSPITAL_COMMUNITY)
Admission: EM | Admit: 2020-02-22 | Discharge: 2020-02-22 | Disposition: A | Discharge status: Home / Self Care | Payer: No Typology Code available for payment source | Attending: Emergency Medicine | Admitting: Emergency Medicine

## 2020-02-22 ENCOUNTER — Encounter (HOSPITAL_COMMUNITY): Payer: Self-pay

## 2020-02-22 ENCOUNTER — Other Ambulatory Visit: Payer: Self-pay

## 2020-02-22 ENCOUNTER — Other Ambulatory Visit (HOSPITAL_COMMUNITY): Payer: Self-pay | Admitting: Neurology

## 2020-02-22 ENCOUNTER — Emergency Department (HOSPITAL_COMMUNITY): Payer: No Typology Code available for payment source

## 2020-02-22 ENCOUNTER — Telehealth (HOSPITAL_COMMUNITY): Payer: Self-pay | Admitting: Radiology

## 2020-02-22 ENCOUNTER — Telehealth: Payer: Self-pay

## 2020-02-22 DIAGNOSIS — G932 Benign intracranial hypertension: Secondary | ICD-10-CM

## 2020-02-22 DIAGNOSIS — R519 Headache, unspecified: Secondary | ICD-10-CM | POA: Insufficient documentation

## 2020-02-22 DIAGNOSIS — Z20822 Contact with and (suspected) exposure to covid-19: Secondary | ICD-10-CM | POA: Insufficient documentation

## 2020-02-22 LAB — RESPIRATORY PANEL BY RT PCR (FLU A&B, COVID)
Influenza A by PCR: NEGATIVE
Influenza B by PCR: NEGATIVE
SARS Coronavirus 2 by RT PCR: NEGATIVE

## 2020-02-22 IMAGING — RF DG FLUORO GUIDE SPINAL/SI JT INJ*L*
1 series · 1 of 1 positions shown · non-contrast
Comparison: none

CLINICAL DATA: Idiopathic intracranial hypertension.

[Series 1: cp_standard · 0.17mm/px · 1 of 1 slices shown]
[im 1/1]
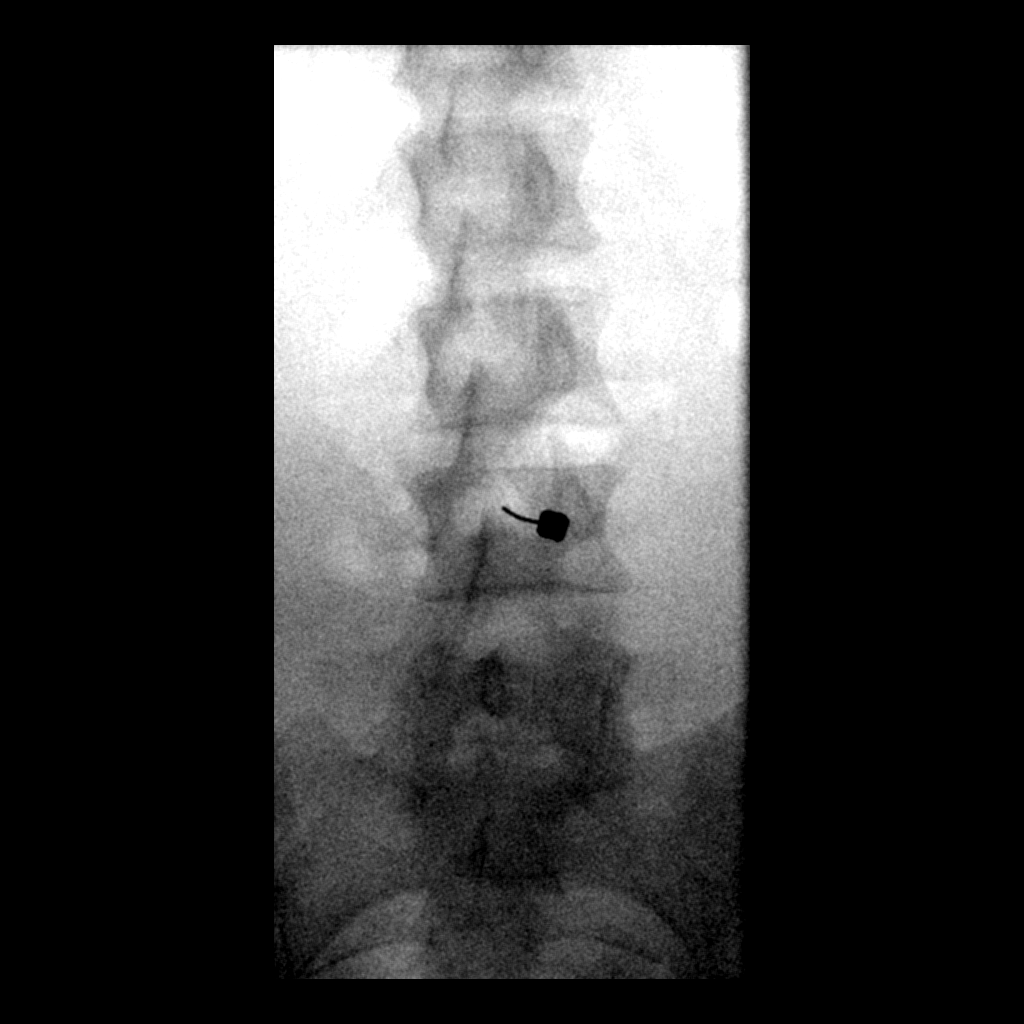

[1 of 1 positions shown; findings below may reference images not displayed]

EXAM:
DIAGNOSTIC LUMBAR PUNCTURE UNDER FLUOROSCOPIC GUIDANCE

FLUOROSCOPY TIME:  Fluoroscopy Time:  1 minutes 20 seconds

Radiation Exposure Index (if provided by the fluoroscopic device):
14.6 mGy

Number of Acquired Spot Images: 0

PROCEDURE:
Informed consent was obtained from the patient prior to the
procedure, including potential complications of headache, allergy,
and pain. With the patient prone, the lower back was prepped with
Betadine. 1% Lidocaine was used for local anesthesia. Lumbar
puncture was performed at the L3-L4 level using a 6 since 20 gauge
gauge needle with return of clear CSF with an opening pressure of 55
cm water. 11-15 ml of CSF were removed. There was some loss of fluid
after stylet removal during confirmation of needle placement due to
elevated pressures. No labs were requested. Closing pressure between
17 and 18 cm of water. The patient tolerated the procedure well and
there were no apparent complications.
IMPRESSION: 1. Large volume lumbar puncture for idiopathic intracranial
hypertension with opening and closing pressures as outlined above.

## 2020-02-22 MED ORDER — PROCHLORPERAZINE EDISYLATE 10 MG/2ML IJ SOLN
10.0000 mg | Freq: Once | INTRAMUSCULAR | Status: AC
  Administered 2020-02-22: 10 mg via INTRAVENOUS
  Filled 2020-02-22: qty 2

## 2020-02-22 MED ORDER — LORAZEPAM 2 MG/ML IJ SOLN
1.0000 mg | Freq: Once | INTRAMUSCULAR | Status: AC
  Administered 2020-02-22: 1 mg via INTRAVENOUS
  Filled 2020-02-22: qty 1

## 2020-02-22 MED ORDER — DIPHENHYDRAMINE HCL 50 MG/ML IJ SOLN
25.0000 mg | Freq: Once | INTRAMUSCULAR | Status: AC
  Administered 2020-02-22: 25 mg via INTRAVENOUS
  Filled 2020-02-22: qty 1

## 2020-02-22 MED ORDER — SODIUM CHLORIDE 0.9 % IV BOLUS
1000.0000 mL | Freq: Once | INTRAVENOUS | Status: AC
  Administered 2020-02-22: 1000 mL via INTRAVENOUS

## 2020-02-22 NOTE — Telephone Encounter (Signed)
LVM for pt to call me back to schedule sleep study  

## 2020-02-22 NOTE — Procedures (Signed)
Consent and time-out was performed.  The L3-L4 interspace was localized utilizing fluoroscopy and the site marked.  Sterile prep and drape was then performed.  A 6 inch 20 gauge spinal needle was utilized and under fluoroscopic guidance the L3-L4 interspace was accessed.  CSF flow was brisk, csf appeared clear.  Some CSF lost during assessment for access.  The patient was rolled to the lateral position, RIGHT lateral position as much as could be performed on the fluoroscopic table.  The manometer was connected via tubing to the needle.  Initial opening pressure in the range of 55 cm of water.  Approximately 8 cc of additional fluid was removed with recheck of pressure in the range of 20-22 cm of water.  Three additional cc of fluid was removed.  Recheck of pressure at 17-18 cm of water.  I suspect that a total of between 11 and 15 cc was removed as there was some fluid loss during brisk flow after stylette removal to check for placement of the needle.  The patient tolerated the procedure well and was returned to the emergency department with postprocedure orders.

## 2020-02-22 NOTE — Telephone Encounter (Signed)
Called pt, left VM for her to call to schedule consult with Deveshwar. JM

## 2020-02-22 NOTE — ED Provider Notes (Signed)
Care transferred to me.  Patient is feeling much better after LP by radiology.  She was instructed to follow-up with neurology and continue taking the Diamox.   Pricilla Loveless, MD 02/22/20 1016

## 2020-02-22 NOTE — ED Provider Notes (Signed)
[facility]-EMERGENCY DEPT Provider Note   CSN: [id number] Arrival date & time: 02/22/20  0434   History Chief complaint: Headache  Brenda Molina is a 34 y.o. female.  The history is provided by the patient.  She has history of polycystic ovarian syndrome and was in the ED 1 week ago and diagnosed with idiopathic intracranial hypertension and started on acetazolamide.  She has been having headaches for the last month and was noted to have papilledema had a routine eye exam and sent to the ED for evaluation at which time lumbar puncture showed elevated intracranial pressure.  She actually felt better when she left the hospital, but headache started to recur 4 days ago and has continued to get worse.  Headache is bitemporal and throbbing.  There is associated photophobia and phonophobia.  There has been some intermittent nausea and vomiting.  She has not noticed any change in her vision.  She has taken acetaminophen without relief.  She currently rates her headache at 10/10.  Past Medical History:  Diagnosis Date  . Allergy   . Anxiety   . PCOS (polycystic ovarian syndrome)     Patient Active Problem List   Diagnosis Date Noted  . Perennial and seasonal allergic rhinitis 04/18/2018  . Cough, persistent 04/18/2018  . History of wheezing 04/18/2018  . 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 Normal vaginal delivery 06/06/2015  . RhD negative 06/05/2015  . Preterm premature rupture of membranes 06/05/2015    Past Surgical History:  Procedure Laterality Date  . WISDOM TOOTH EXTRACTION       OB History    Gravida  1   Para  1   Term      Preterm  1   AB      Living  1     SAB      TAB      Ectopic      Multiple  0   Live Births  1           Family History  Problem Relation Age of Onset  . Asthma Father   . Hypertension Father     Social History   Tobacco Use  . Smoking status: Never Smoker  . Smokeless tobacco: Never Used   Vaping Use  . Vaping Use: Never used  Substance Use Topics  . Alcohol use: Yes    Comment: occ  . Drug use: No    Home Medications Prior to Admission medications   Medication Sig Start Date End Date Taking? Authorizing Provider  acetaminophen (TYLENOL) 650 MG CR tablet Take 650 mg by mouth every 8 (eight) hours as needed for pain.    [provider]  acetaZOLAMIDE (DIAMOX) 500 MG capsule Take 1 capsule (500 mg total) by mouth 2 (two) times daily. 02/15/20   Arby Barrette, MD  busPIRone (BUSPAR) 7.5 MG tablet TAKE 1 TABLET BY MOUTH TWICE A DAY Patient taking differently: Take 7.5 mg by mouth daily as needed (for anxiety).  11/03/19   Shade Flood, MD  carboxymethylcellulose (REFRESH PLUS) 0.5 % SOLN Place 1 drop into both eyes 3 (three) times daily as needed (dry eyes).    [provider]  fluticasone (FLONASE) 50 MCG/ACT nasal spray Place 2 sprays into both nostrils daily. 12/09/17   Valarie Cones, Dema Severin, PA-C  hydrOXYzine (ATARAX/VISTARIL) 10 MG tablet Take 0.5-1 tablets (5-10 mg total) by mouth at bedtime as needed for anxiety (or sleep). 02/15/19   Shade Flood, MD  levonorgestrel (MIRENA, 52 MG,) 20 MCG/24HR IUD 1 each by Intrauterine route once.     [provider]  omeprazole (PRILOSEC) 20 MG capsule Take 1 capsule (20 mg total) by mouth daily. 03/16/18   Doristine Bosworth, MD  ondansetron (ZOFRAN) 4 MG tablet Take 1 tablet (4 mg total) by mouth every 8 (eight) hours as needed for nausea or vomiting. 02/20/20   Huston Foley, MD  sertraline (ZOLOFT) 100 MG tablet TAKE 1 TABLET BY MOUTH DAILY. START 1/2 PILL FOR 1 WEEK., THEN INCREASE TO FULL PILL IF TOLERATED. Patient taking differently: Take 100 mg by mouth daily as needed (anxiety).  11/03/19   Shade Flood, MD    Allergies    Patient has no known allergies.  Review of Systems   Review of Systems  All other systems reviewed and are negative.   Physical Exam Updated Vital Signs BP (!) 137/95    Pulse 81   Temp 97.8 F (36.6 C) (Oral)   Resp 16   LMP 02/05/2020   SpO2 100%   Physical Exam Vitals and nursing note reviewed.   34 year old female, resting comfortably and in no acute distress. Vital signs are normal. Oxygen saturation is 100%, which is normal. Head is normocephalic and atraumatic. PERRLA, EOMI. Oropharynx is clear.  Mild photophobia is noted.  Fundi show increased optic disc cupping without overt papilledema. Neck is nontender and supple without adenopathy or JVD. Back is nontender and there is no CVA tenderness. Lungs are clear without rales, wheezes, or rhonchi. Chest is nontender. Heart has regular rate and rhythm without murmur. Abdomen is soft, flat, nontender without masses or hepatosplenomegaly and peristalsis is normoactive. Extremities have no cyanosis or edema, full range of motion is present. Skin is warm and dry without rash. Neurologic: Mental status is normal, cranial nerves are intact, there are no motor or sensory deficits.   ED Results / Procedures / Treatments   Labs  Procedures .Lumbar Puncture  Date/Time: 02/22/2020 6:22 AM Performed by: Dione Booze, MD Authorized by: Dione Booze, MD   Consent:    Consent obtained:  Verbal   Consent given by:  Patient   Risks discussed:  Bleeding, infection, pain and repeat procedure   Alternatives discussed:  Alternative treatment Pre-procedure details:    Procedure purpose:  Therapeutic   Preparation: Patient was prepped and draped in usual sterile fashion   Sedation:    Sedation type:  Anxiolysis Anesthesia (see MAR for exact dosages):    Anesthesia method:  Local infiltration   Local anesthetic:  Lidocaine 1% w/o epi Procedure details:    Lumbar space:  L3-L4 interspace   Patient position:  L lateral decubitus   Needle gauge:  22   Needle type:  Spinal needle - Quincke tip   Needle length (in):  5.0   Ultrasound guidance: no     Number of attempts:  2 Post-procedure:    Patient  tolerance of procedure:  Tolerated well, no immediate complications Comments:     Failed to get any spinal fluid - will send to interventional radiology    Medications Ordered in ED Medications  LORazepam (ATIVAN) injection 1 mg (has no administration in time range)    ED Course  I have reviewed the triage vital signs and the nursing notes.  MDM Rules/Calculators/A&P Headache which most likely is recurrence of headache from idiopathic intracranial hypertension.  Old records are reviewed confirming ED visit 1 week ago.  Lumbar puncture was done, but only  5 mL of CSF was removed with an opening pressure of 40 cm of water.  I suspect that she needs a significantly larger amount of fluid to be removed to give acetazolamide a fighting chance to be effective.  Patient was given option of trial of a migraine cocktail with lumbar puncture if that fails versus proceeding straight to lumbar puncture.  She has decided to proceed with lumbar puncture, but is requesting something for anxiety and she is given a dose of lorazepam.  She had been premedicated for her other lumbar puncture with the same medication.  I was unable to successfully do lumbar puncture.  Will send to radiology to do lumbar puncture under fluoroscopy.  In the meantime, she will be given a migraine cocktail of normal saline, prochlorperazine, diphenhydramine.  Case is signed out to Dr. Criss Alvine.  Final Clinical Impression(s) / ED Diagnoses Final diagnoses:  Bad headache  Idiopathic intracranial hypertension    Rx / DC Orders ED Discharge Orders    None       Dione Booze, MD 02/22/20 [phone]

## 2020-02-22 NOTE — ED Notes (Signed)
Discharge paperwork reviewed with pt.  Pt with no questions or concerns at time of discharge, ambulatory to ED exit.  °

## 2020-02-22 NOTE — ED Triage Notes (Signed)
Pt's ophthamologist sent pt here for further evaluation and from here she was sent to a neurologist to follow up with her headaches, pt was given diamox and nausea medicine and dx with IPH and told to follow up in a month Pt continues to complain of a severe headache with no relief from the meds Pt also had a lumber puncture done on the ED visit

## 2020-02-22 NOTE — Discharge Instructions (Addendum)
If you develop continued, recurrent, or worsening headache, fever, neck stiffness, vomiting, blurry or double vision, weakness or numbness in your arms or legs, trouble speaking, or any other new/concerning symptoms then return to the ER for evaluation.  

## 2020-02-27 ENCOUNTER — Ambulatory Visit (HOSPITAL_COMMUNITY)
Admission: RE | Admit: 2020-02-27 | Discharge: 2020-02-27 | Disposition: A | Discharge status: Home / Self Care | Payer: No Typology Code available for payment source | Source: Ambulatory Visit | Attending: Neurology | Admitting: Neurology

## 2020-02-27 ENCOUNTER — Other Ambulatory Visit: Payer: Self-pay

## 2020-02-27 DIAGNOSIS — G932 Benign intracranial hypertension: Secondary | ICD-10-CM

## 2020-02-28 DIAGNOSIS — R519 Headache, unspecified: Secondary | ICD-10-CM | POA: Diagnosis not present

## 2020-02-28 HISTORY — PX: IR RADIOLOGIST EVAL & MGMT: IMG5224

## 2020-03-01 ENCOUNTER — Other Ambulatory Visit (HOSPITAL_COMMUNITY): Payer: Self-pay | Admitting: Interventional Radiology

## 2020-03-01 DIAGNOSIS — R519 Headache, unspecified: Secondary | ICD-10-CM

## 2020-03-07 DIAGNOSIS — Z0100 Encounter for examination of eyes and vision without abnormal findings: Secondary | ICD-10-CM | POA: Diagnosis not present

## 2020-03-14 ENCOUNTER — Ambulatory Visit (HOSPITAL_COMMUNITY): Admission: RE | Admit: 2020-03-14 | Payer: No Typology Code available for payment source | Source: Ambulatory Visit

## 2020-03-18 ENCOUNTER — Ambulatory Visit (INDEPENDENT_AMBULATORY_CARE_PROVIDER_SITE_OTHER): Payer: No Typology Code available for payment source | Admitting: Neurology

## 2020-03-18 DIAGNOSIS — R5383 Other fatigue: Secondary | ICD-10-CM

## 2020-03-18 DIAGNOSIS — G4733 Obstructive sleep apnea (adult) (pediatric): Secondary | ICD-10-CM | POA: Diagnosis not present

## 2020-03-18 DIAGNOSIS — G932 Benign intracranial hypertension: Secondary | ICD-10-CM

## 2020-03-18 DIAGNOSIS — H4711 Papilledema associated with increased intracranial pressure: Secondary | ICD-10-CM

## 2020-03-18 DIAGNOSIS — R0683 Snoring: Secondary | ICD-10-CM

## 2020-03-18 DIAGNOSIS — R519 Headache, unspecified: Secondary | ICD-10-CM

## 2020-03-19 ENCOUNTER — Ambulatory Visit (INDEPENDENT_AMBULATORY_CARE_PROVIDER_SITE_OTHER): Payer: No Typology Code available for payment source | Admitting: Neurology

## 2020-03-19 ENCOUNTER — Encounter: Payer: Self-pay | Admitting: Neurology

## 2020-03-19 VITALS — BP 148/89 | HR 89 | Ht 63.0 in | Wt 243.0 lb

## 2020-03-19 DIAGNOSIS — G932 Benign intracranial hypertension: Secondary | ICD-10-CM | POA: Diagnosis not present

## 2020-03-19 DIAGNOSIS — H538 Other visual disturbances: Secondary | ICD-10-CM | POA: Diagnosis not present

## 2020-03-19 DIAGNOSIS — R69 Illness, unspecified: Secondary | ICD-10-CM | POA: Diagnosis not present

## 2020-03-19 DIAGNOSIS — R519 Headache, unspecified: Secondary | ICD-10-CM | POA: Diagnosis not present

## 2020-03-19 MED ORDER — ACETAZOLAMIDE ER 500 MG PO CP12
ORAL_CAPSULE | ORAL | 3 refills | Status: DC
Start: 2020-03-19 — End: 2020-04-04

## 2020-03-19 NOTE — Patient Instructions (Signed)
It is good to see you again today.  I am glad to hear that your headaches are a little bit better after your second lumbar puncture which confirmed significantly elevated spinal fluid pressure.    As discussed, I would like for you to increase your Diamox to 1 pill in the morning and 2 at night for a total of 3 pills daily from currently 2 pills daily.  We will call you with your test results on the home sleep test.  If you have obstructive sleep apnea, I will ask you to start treatment with a CPAP-like machine called AutoPap.    We will also await your angiogram results from December and please keep your appointment with your ophthalmologist also in early December.  I would like to see you back in a couple of months in this office.  Please continue to work on a healthy lifestyle and weight loss.  If you have obstructive sleep

## 2020-03-19 NOTE — Progress Notes (Signed)
Subjective:    Patient ID: Brenda Molina is a 34 y.o. female.  HPI     Interim history:   Brenda Molina is a 34 year old right-handed woman with an underlying medical history of PCOS, anxiety, allergies, and severe obesity with a BMI of over 87, who presents for follow-up consultation of Brenda Molina idiopathic intracranial hypertension. The patient is unaccompanied today. I first met Brenda Molina on 02/20/2020 as a referral from the emergency room. She had been sent to the ER after an abnormal eye examination which revealed bilateral significant papilledema. She had a elevated opening pressure during a lumbar puncture in the emergency room on 02/15/2020. She had also undergone MRI of the brain as well as MR orbits and MRV. She was advised to continue with Diamox, she reported headaches. She was also advised to seek consultation with interventional radiology to investigate bilateral distal transverse sinus stenoses.  She presented to the emergency room again on 02/22/2020 with severe headache. A lumbar puncture in the ER was unsuccessful and she had a fluoroscopic guided lumbar puncture on 02/22/2020 which showed an opening pressure of 55, large-volume tap was done and she had a closing pressure of 18. She felt better. She had an interim evaluation with interventional radiology. She is scheduled early next month for a diagnostic arteriogram to allow evaluation of Brenda Molina venous drainage system and clarification of the suspected distal transverse sinus stenoses.  Today, 03/19/2020: She reports that Brenda Molina headaches improved after Brenda Molina lumbar puncture but it was difficult to go through 2 field LPs and then have it done under radiology.  She still has blurry vision, Brenda Molina eye examination was pushed out until after Brenda Molina angiogram.  She has an eye appointment on 04/04/2020.  She has not had any new symptoms, still has intermittent headaches.  Completed Brenda Molina home sleep test last night.  She is able to tolerate the Diamox, takes 1 pill  twice daily, nausea is a little bit better and she did not have to take Zofran on a regular basis.  The patient's allergies, current medications, family history, past medical history, past social history, past surgical history and problem list were reviewed and updated as appropriate.  Previously:   02/20/20: (She) was sent to the emergency room on 02/15/2020 after an eye examination.  She reported headaches and blurry vision.  Brenda Molina ophthalmology exam revealed florid bilateral papilledema.  I reviewed emergency room records.  Ophthalmology records were also scanned in from Brenda Molina visit to [place] [facility].  In the emergency room she had multiple scans including brain MRI with and without contrast, MR orbits with and without contrast and MRV head with and without contrast.  I reviewed the results: IMPRESSION: 1. Technically limited exam due to extensive susceptibility artifact related to dental hardware. 2. Subtle bulging of the optic discs at the posterior aspects of both globes, left more prominent than right, consistent with papilledema. 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 Constellation of findings can be seen in the setting of idiopathic intracranial hypertension. Correlation with LP and opening pressures could be performed for further evaluation as warranted. 3. Otherwise normal MRI of the brain and orbits. No other acute abnormality identified. No definite evidence for acute optic neuritis. 4. Otherwise normal intracranial MRV. No evidence for dural sinus thrombosis.   He also had a lumbar puncture in the emergency room with an opening pressure elevated at 42.  Routine laboratory results were obtained including routine tests on Brenda Molina CSF which initially was blood-tinged with an  RBC count of 973, WBC count of 7 with subsequent improvement in tube 4.  Protein in the CSF was 18, glucose was 67.   She was started on Diamox in the ER.   She reports that  she has noticed nausea since starting the Diamox.  She is taking 500 mg twice daily.  She had occasional retching and brought up mucus.  She had improvement in Brenda Molina headache but now she has a pressure sensation and still has blurry vision and light sensitivity.  She has not noticed any new symptoms.  She has not made a follow-up appointment yet with Brenda Molina ophthalmologist, Dr. Tama High.  She was told to follow-up in 1 month.  She does not have any medication for nausea. She reports some snoring and sleep disruption and feels tired during the day.  He has never had a sleep study.  She lives with Brenda Molina 44-year-old daughter.  Brenda Molina Epworth sleepiness score is 2 out of 24, fatigue severity score is 51 out of 63.  She goes to bed around 830 and rise time is between 6:15 AM and 7 AM.   She went to urgent care in early October around the eighth and was told she had a sinus and ear infection was treated with antibiotic treatment.  She went back to urgent care about a week later and was given a prescription for prednisone.   Brenda Molina Past Medical History Is Significant For: Past Medical History:  Diagnosis Date  . Allergy   . Anxiety   . PCOS (polycystic ovarian syndrome)     Brenda Molina Past Surgical History Is Significant For: Past Surgical History:  Procedure Laterality Date  . IR RADIOLOGIST EVAL & MGMT  02/28/2020  . WISDOM TOOTH EXTRACTION      Brenda Molina Family History Is Significant For: Family History  Problem Relation Age of Onset  . Asthma Father   . Hypertension Father     Brenda Molina Social History Is Significant For: Social History   Socioeconomic History  . Marital status: Single    Spouse name: Not on file  . Number of children: 1  . Years of education: Not on file  . Highest education level: Not on file  Occupational History  . Not on file  Tobacco Use  . Smoking status: Never Smoker  . Smokeless tobacco: Never Used  Vaping Use  . Vaping Use: Never used  Substance and Sexual Activity  .  Alcohol use: Yes    Comment: occ  . Drug use: No  . Sexual activity: Yes    Partners: Male    Birth control/protection: Condom  Other Topics Concern  . 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 She is getting ready to go to school at [employer] for CNA   Social Determinants of Health   Financial Resource Strain:   . Difficulty of Paying Living Expenses: Not on file  Food Insecurity:   . Worried About Charity fundraiser in the Last Year: Not on file  . Ran Out of Food in the Last Year: Not on file  Transportation Needs:   . Lack of Transportation (Medical): Not on file  . Lack of Transportation (Non-Medical): Not on file  Physical Activity:   . Days of Exercise per Week: Not on file  . Minutes of Exercise per Session: Not on file  Stress:   . Feeling of Stress : Not on file  Social Connections:   . Frequency of Communication with Friends and Family: Not on file  . Frequency of Social Gatherings with Friends and Family: Not on file  . Attends Religious Services: Not on file  . Active Member of Clubs or Organizations: Not on file  . Attends Archivist Meetings: Not on file  . Marital Status: Not on file    Brenda Molina Allergies Are:  No Known Allergies:   Brenda Molina Current Medications Are:  Outpatient Encounter Medications as of 03/19/2020  Medication Sig  . acetaminophen (TYLENOL) 650 MG CR tablet Take 650-1,300 mg by mouth every 8 (eight) hours as needed for pain.   Marland Kitchen acetaZOLAMIDE (DIAMOX) 500 MG capsule Take 1 pill in AM and 2 pills at night.  . Aspirin-Acetaminophen-Caffeine (GOODY HEADACHE PO) Take 1 packet by mouth as needed (headache/pain).  . busPIRone (BUSPAR) 7.5 MG tablet TAKE 1 TABLET BY MOUTH TWICE A DAY  . fluticasone (FLONASE) 50 MCG/ACT nasal spray Place 2 sprays into both nostrils daily. (Patient taking differently: Place 2 sprays into both nostrils daily as needed for  allergies. )  . levonorgestrel (MIRENA, 52 MG,) 20 MCG/24HR IUD 1 each by Intrauterine route once.   . ondansetron (ZOFRAN) 4 MG tablet Take 1 tablet (4 mg total) by mouth every 8 (eight) hours as needed for nausea or vomiting.  . sertraline (ZOLOFT) 100 MG tablet TAKE 1 TABLET BY MOUTH DAILY. START 1/2 PILL FOR 1 WEEK., THEN INCREASE TO FULL PILL IF TOLERATED.  . [DISCONTINUED] acetaZOLAMIDE (DIAMOX) 500 MG capsule Take 1 capsule (500 mg total) by mouth 2 (two) times daily.   No facility-administered encounter medications on file as of 03/19/2020.  :  Review of Systems:  Out of a complete 14 point review of systems, all are reviewed and negative with the exception of these symptoms as listed below: Review of Systems  Neurological:       Pt presents today to discuss Brenda Molina headaches. Pt reports that Brenda Molina headaches have been better. Pt completed Brenda Molina HST last night.    Objective:  Neurological Exam  Physical Exam Physical Examination:   Vitals:   03/19/20 1127  BP: (!) 148/89  Pulse: 89    General Examination: The patient is a very pleasant 34 y.o. female in no acute distress. She appears well-developed and well-nourished and well groomed.   HEENT: Normocephalic, atraumatic, pupils are equal, round and reactive to light and accommodation. She has frequent eye movements and blinking, but overall less than last time.  Extraocular tracking otherwise is well preserved, face is symmetric with normal facial animation and normal facial sensation, hearing grossly intact.   Speech is clear with no dysarthria noted. There is no hypophonia. There is no lip, neck/head, jaw or voice tremor. Neck is supple with full range of passive and active motion. There are no carotid bruits on auscultation. Oropharynx exam reveals: mild mouth dryness, good dental hygiene and moderate airway crowding.  Tongue protrudes centrally in palate elevates symmetrically.  Chest: Clear to auscultation without wheezing,  rhonchi or crackles noted.  Heart: S1+S2+0, regular and normal without murmurs, rubs or gallops noted.   Abdomen: Soft, non-tender and non-distended with normal bowel sounds appreciated on auscultation.  Extremities: There is no pitting edema in the distal lower extremities bilaterally.   Skin: Warm and dry without trophic changes noted.  Musculoskeletal: exam reveals no obvious joint deformities, tenderness or joint swelling or erythema.   Neurologically:  Mental status: The patient is awake, alert and oriented in  all 4 spheres. Brenda Molina immediate and remote memory, attention, language skills and fund of knowledge are appropriate. There is no evidence of aphasia, agnosia, apraxia or anomia. Speech is clear with normal prosody and enunciation. Thought process is linear. Mood is normal and affect is normal.  Cranial nerves II - XII are as described above under HEENT exam. In addition: shoulder shrug is normal with equal shoulder height noted. Motor exam: Normal bulk, strength and tone is noted. There is no drift, tremor or rebound. Romberg is negative. Reflexes are 1-2+ throughout. Fine motor skills and coordination: intact with normal finger taps, normal hand movements, normal rapid alternating patting, normal foot taps and normal foot agility.  Cerebellar testing: No dysmetria or intention tremor on finger to nose testing. Heel to shin is unremarkable bilaterally. There is no truncal or gait ataxia.  Sensory exam: intact to light touch in the upper and lower extremities.  Gait, station and balance: She stands easily. No veering to one side is noted. No leaning to one side is noted. Posture is age-appropriate and stance is narrow based. Gait shows normal stride length and normal pace. No problems turning are noted. Tandem walk is unremarkable.   Assessment and Plan:   In summary, ZOEJANE GAULIN is a very pleasant 34 y.o.-year old female  with an underlying medical history of PCOS, anxiety,  allergies, and severe obesity with a BMI of over 40, who presents for follow-up consultation of Brenda Molina idiopathic intracranial hypertension after being diagnosed with bilateral florid papilledema by Brenda Molina ophthalmologist in October 2021.  She went back to the ER on 02/22/2020 and had a large-volume tap with an opening pressure highly elevated at 55 cm.  She felt better after the lumbar puncture.  She has been on Diamox 500 mg twice daily since October 2021.  She is advised to increase it to 1 pill in the morning and 2 at night at this juncture.  We will call Brenda Molina with Brenda Molina home sleep test results soon.  If she has sleep apnea, she is advised to start AutoPap therapy.  She has a pending cerebral angiogram in early December, on 04/02/2020 and a follow-up with Brenda Molina ophthalmologist pending for 04/04/2020.  I would like to see Brenda Molina back in about 2 months.  At the time of Brenda Molina first lumbar puncture in October 2021, Brenda Molina opening pressure was 42 cm.   She had several imaging tests through the ER at the time including MRI brain and orbits and MRV of the head.  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  She has been seen by interventional radiology for this and further evaluation is pending.  She is agreeable to increasing the Diamox at this time.  I provided new instructions and a new prescription we also talked about VP shunt in more detail again today.  I plan to see Brenda Molina back soon.  I answered all Brenda Molina questions today and she was in agreement.  I spent 30 minutes in total face-to-face time and in reviewing records during pre-charting, more than 50% of which was spent in counseling and coordination of care, reviewing test results, reviewing medications and treatment regimen and/or in discussing or reviewing the diagnosis of IIH, the prognosis and treatment options. Pertinent laboratory and imaging test results that were available during this visit with the patient were reviewed by me and considered in my  medical decision making (see chart for details).

## 2020-03-29 ENCOUNTER — Telehealth: Payer: Self-pay | Admitting: Neurology

## 2020-03-29 NOTE — Telephone Encounter (Signed)
-----   Message from Huston Foley, MD sent at 03/29/2020  9:17 AM EST ----- Patient referred by ER for IIH, last seen by me on 03/19/20, HST on 03/18/20.    Please call and notify the patient that the recent home sleep test showed obstructive sleep apnea. OSA is overall mild, but worth treating to see if she feels better after treatment, especially with regards to her headaches and to be able to lose weight a little better. To that end, I recommend treatment in the form of autoPAP, which means, that we don't have to bring her in for a sleep study with CPAP, but will let her try an autoPAP machine at home, through a DME company (of her choice, or as per insurance requirement). The DME representative will educate her on how to use the machine, how to put the mask on, etc. I have placed an order in the chart. Please send referral, talk to patient, send report to referring MD. We will need a FU in sleep clinic for 10 weeks post-PAP set up, please arrange that with me or one of our NPs. Thanks,   Huston Foley, MD, PhD [facility])

## 2020-03-29 NOTE — Progress Notes (Signed)
Patient referred by ER for IIH, last seen by me on 03/19/20, HST on 03/18/20.    Please call and notify the patient that the recent home sleep test showed obstructive sleep apnea. OSA is overall mild, but worth treating to see if she feels better after treatment, especially with regards to her headaches and to be able to lose weight a little better. To that end, I recommend treatment in the form of autoPAP, which means, that we don't have to bring her in for a sleep study with CPAP, but will let her try an autoPAP machine at home, through a DME company (of her choice, or as per insurance requirement). The DME representative will educate her on how to use the machine, how to put the mask on, etc. I have placed an order in the chart. Please send referral, talk to patient, send report to referring MD. We will need a FU in sleep clinic for 10 weeks post-PAP set up, please arrange that with me or one of our NPs. Thanks,   Huston Foley, MD, PhD [facility])

## 2020-03-29 NOTE — Procedures (Signed)
   [facility]  HOME SLEEP TEST (Watch PAT)  STUDY DATE: 03/18/20  DOB: November 14, 1985  MRN: [id number]  ORDERING CLINICIAN: Huston Foley, MD, PhD   REFERRING CLINICIAN: Shade Flood, MD   CLINICAL INFORMATION/HISTORY: 34 year old woman with a history of PCOS, anxiety, allergies, and severe obesity with a BMI of over 40, who has a history of idiopathic intracranial hypertension. She is at risk for OSA.   BMI: 43.05 kg/m  FINDINGS:   Total Record Time (hours, min): 6 H 26 min  Total Sleep Time (hours, min):  5 H 26 min   Percent REM (%):    31.42 %   Calculated pAHI (per hour):  7.3       REM pAHI:    19.1     NREM pAHI: 3.0   Oxygen Saturation (%) Mean:  96  Minimum oxygen saturation (%):        84   O2 Saturation Range (%): 58-104  O2Saturation (minutes) <=88%: .2 min   Pulse Mean (bpm):    74  Pulse Range (58-104)   IMPRESSION: OSA (obstructive sleep apnea), mild  RECOMMENDATION:  This home sleep test demonstrates overall mild obstructive sleep apnea with a total AHI of 7.3/hour and O2 nadir of 84%.  Mild intermittent snoring was noted.  Given the patient's medical history and sleep related complaints, treatment with positive airway pressure is recommended. This can be achieved in the form of autoPAP trial/titration at home. A full night CPAP titration study will help with proper treatment settings and mask fitting if needed. Alternative treatments include weight loss along with avoidance of the supine sleep position, or an oral appliance in appropriate candidates.   Please note that untreated obstructive sleep apnea may carry additional perioperative morbidity. Patients with significant obstructive sleep apnea should receive perioperative PAP therapy and the surgeons and particularly the anesthesiologist should be informed of the diagnosis and the severity of the sleep disordered breathing. The patient should be cautioned not to drive, work at heights, or  operate dangerous or heavy equipment when tired or sleepy. Review and reiteration of good sleep hygiene measures should be pursued with any patient. Other causes of the patient's symptoms, including circadian rhythm disturbances, an underlying mood disorder, medication effect and/or an underlying medical problem cannot be ruled out based on this test. Clinical correlation is recommended.  The patient will be seen in follow up in sleep clinic at [facility].  I certify that I have reviewed the raw data recording prior to the issuance of this report in accordance with the standards of the American Academy of Sleep Medicine (AASM).  INTERPRETING PHYSICIAN:    Huston Foley, MD, PhD  Board Certified in Neurology and Sleep Medicine  [facility] [address], [address] [place], [place] [address] [phone]

## 2020-03-29 NOTE — Addendum Note (Signed)
Addended by: Huston Foley on: 03/29/2020 09:17 AM   Modules accepted: Orders

## 2020-03-29 NOTE — Telephone Encounter (Signed)
I called Brenda Molina. I advised Brenda Molina that Dr. Frances Furbish reviewed their sleep study results and found that Brenda Molina has mild sleep apnea. Dr. Frances Furbish recommends that Brenda Molina starts auto CPAP. I reviewed PAP compliance expectations with the Brenda Molina. Brenda Molina is agreeable to starting a CPAP. I advised Brenda Molina that an order will be sent to a DME, [facility]  and [facility] will call the Brenda Molina within about one week after they file with the Brenda Molina's insurance. [facility]) will show the Brenda Molina how to use the machine, fit for masks, and troubleshoot the CPAP if needed. A follow up appt will need to be made for insurance purposes with Dr. Frances Furbish or NP. Brenda Molina verbalized understanding to call our office to schedule a initial CPAP 31-90 days from the date they pick up the machine. A letter with all of this information in it will be sent to the Brenda Molina as a reminder through mychart. Brenda Molina verbalized understanding of results. Brenda Molina had no questions at this time but was encouraged to call back if questions arise. I have sent the order to [facility]) and have received confirmation that they have received the order.

## 2020-04-01 ENCOUNTER — Other Ambulatory Visit: Payer: Self-pay | Admitting: Radiology

## 2020-04-02 ENCOUNTER — Other Ambulatory Visit: Payer: Self-pay

## 2020-04-02 ENCOUNTER — Encounter (HOSPITAL_COMMUNITY): Payer: Self-pay

## 2020-04-02 ENCOUNTER — Ambulatory Visit (HOSPITAL_COMMUNITY)
Admission: RE | Admit: 2020-04-02 | Discharge: 2020-04-02 | Disposition: A | Discharge status: Home / Self Care | Payer: No Typology Code available for payment source | Source: Ambulatory Visit | Attending: Interventional Radiology | Admitting: Interventional Radiology

## 2020-04-02 DIAGNOSIS — G932 Benign intracranial hypertension: Secondary | ICD-10-CM | POA: Insufficient documentation

## 2020-04-02 DIAGNOSIS — R519 Headache, unspecified: Secondary | ICD-10-CM

## 2020-04-02 DIAGNOSIS — H4711 Papilledema associated with increased intracranial pressure: Secondary | ICD-10-CM | POA: Diagnosis not present

## 2020-04-02 HISTORY — PX: IR ANGIO VERTEBRAL SEL VERTEBRAL BILAT MOD SED: IMG5369

## 2020-04-02 HISTORY — PX: IR ANGIO INTRA EXTRACRAN SEL COM CAROTID INNOMINATE BILAT MOD SED: IMG5360

## 2020-04-02 LAB — PROTIME-INR
INR: 1 (ref 0.8–1.2)
Prothrombin Time: 12.8 seconds (ref 11.4–15.2)

## 2020-04-02 LAB — CBC WITH DIFFERENTIAL/PLATELET
Abs Immature Granulocytes: 0.02 10*3/uL (ref 0.00–0.07)
Basophils Absolute: 0 10*3/uL (ref 0.0–0.1)
Basophils Relative: 1 %
Eosinophils Absolute: 0.3 10*3/uL (ref 0.0–0.5)
Eosinophils Relative: 4 %
HCT: 41.3 % (ref 36.0–46.0)
Hemoglobin: 13.6 g/dL (ref 12.0–15.0)
Immature Granulocytes: 0 %
Lymphocytes Relative: 23 %
Lymphs Abs: 1.7 10*3/uL (ref 0.7–4.0)
MCH: 24.9 pg — ABNORMAL LOW (ref 26.0–34.0)
MCHC: 32.9 g/dL (ref 30.0–36.0)
MCV: 75.6 fL — ABNORMAL LOW (ref 80.0–100.0)
Monocytes Absolute: 0.5 10*3/uL (ref 0.1–1.0)
Monocytes Relative: 7 %
Neutro Abs: 4.9 10*3/uL (ref 1.7–7.7)
Neutrophils Relative %: 65 %
Platelets: 283 10*3/uL (ref 150–400)
RBC: 5.46 MIL/uL — ABNORMAL HIGH (ref 3.87–5.11)
RDW: 15.9 % — ABNORMAL HIGH (ref 11.5–15.5)
WBC: 7.5 10*3/uL (ref 4.0–10.5)
nRBC: 0 % (ref 0.0–0.2)

## 2020-04-02 LAB — POCT I-STAT, CHEM 8
BUN: 9 mg/dL (ref 6–20)
Calcium, Ion: 1.35 mmol/L (ref 1.15–1.40)
Chloride: 107 mmol/L (ref 98–111)
Creatinine, Ser: 1 mg/dL (ref 0.44–1.00)
Glucose, Bld: 102 mg/dL — ABNORMAL HIGH (ref 70–99)
HCT: 42 % (ref 36.0–46.0)
Hemoglobin: 14.3 g/dL (ref 12.0–15.0)
Potassium: 3.7 mmol/L (ref 3.5–5.1)
Sodium: 143 mmol/L (ref 135–145)
TCO2: 22 mmol/L (ref 22–32)

## 2020-04-02 LAB — PREGNANCY, URINE: Preg Test, Ur: NEGATIVE

## 2020-04-02 IMAGING — XA IR ANGIO INTRA EXTRACRAN SEL COM CAROTID INNOMINATE BILAT MOD SE
1 of 2 series · 11 of 24 positions shown · IV contrast (IODINE)
Comparison: MRV of the brain [DATE].

CLINICAL DATA: History of worsening headaches, blurred vision and
right-sided pulsatile tinnitus. History of idiopathic intracranial
hypertension. Abnormal MRV of the brain.

EXAM:
BILATERAL COMMON CAROTID AND INNOMINATE ANGIOGRAPHY
TECHNIQUE: Informed written consent was obtained from the patient after a
thorough discussion of the procedural risks, benefits and
alternatives. All questions were addressed. Maximal Sterile Barrier
Technique was utilized including caps, mask, sterile gowns, sterile
gloves, sterile drape, hand hygiene and skin antiseptic. A timeout
was performed prior to the initiation of the procedure.

[Series 300: dr. (person_name) · 11 of 295 slices shown]
[im 1/295]
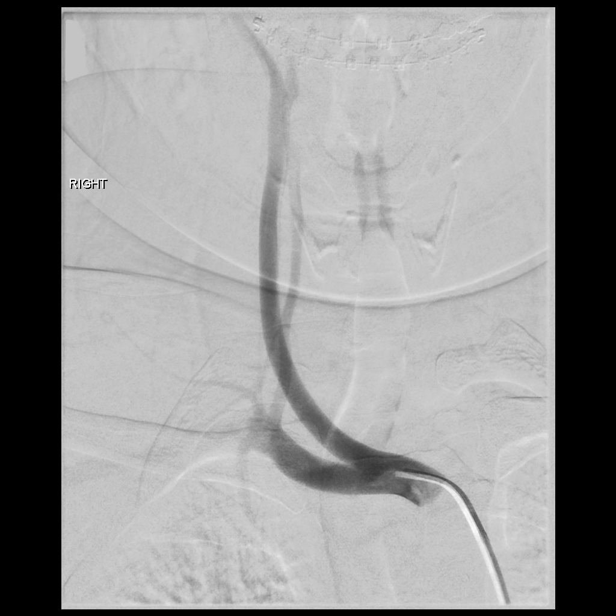
[im 27/295]
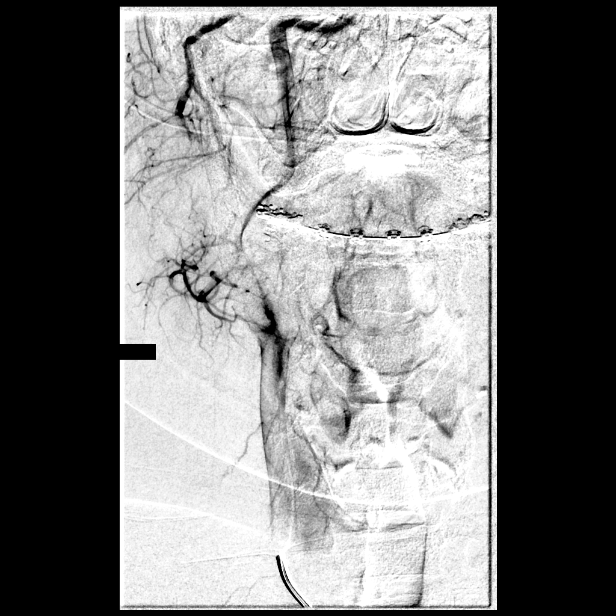
[im 54/295]
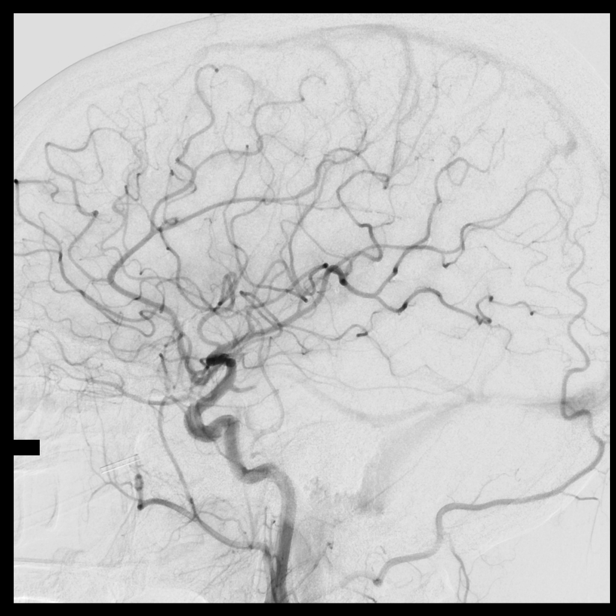
[im 81/295]
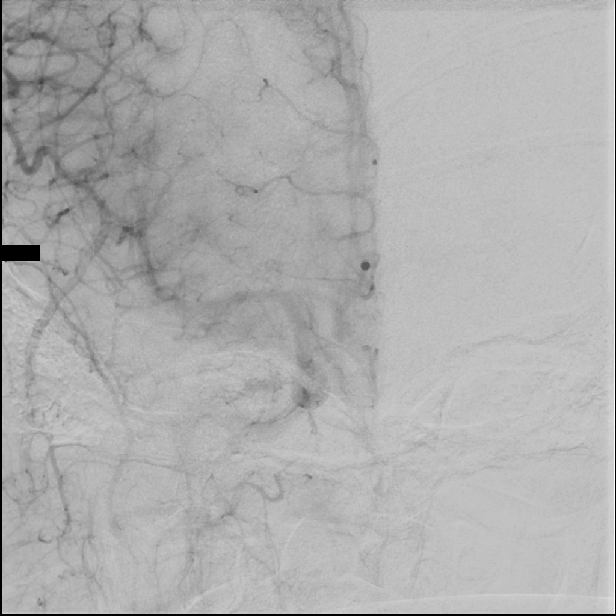
[im 107/295]
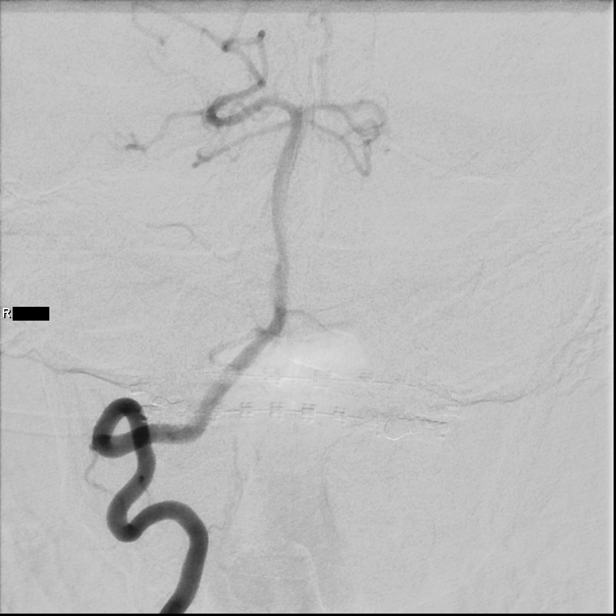
[im 148/295]
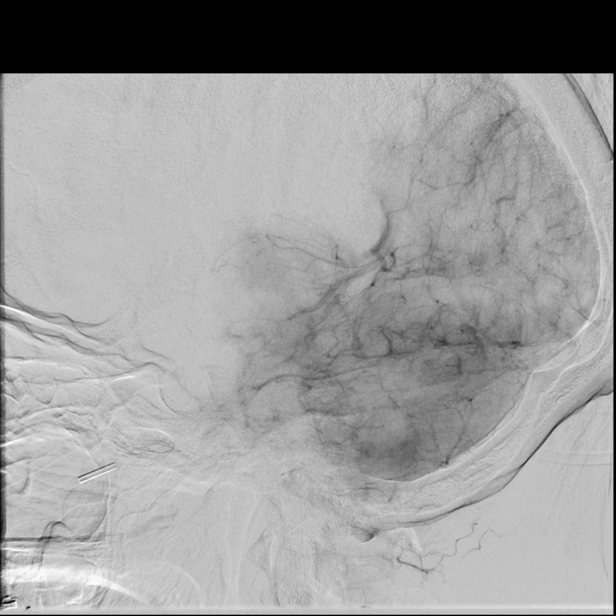
[im 174/295]
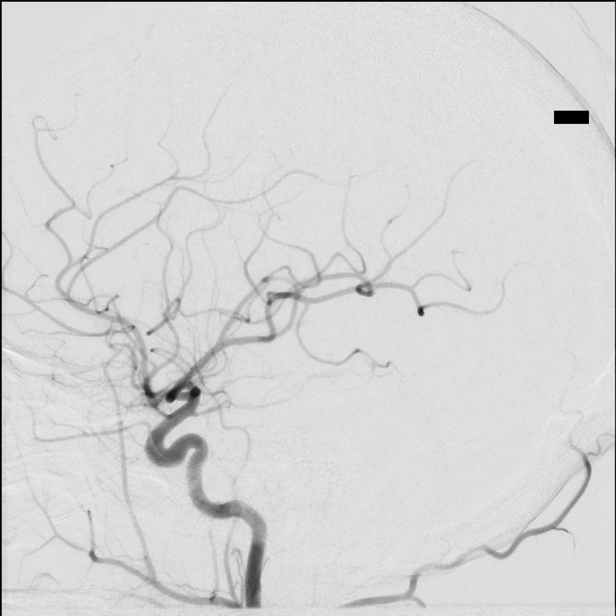
[im 201/295]
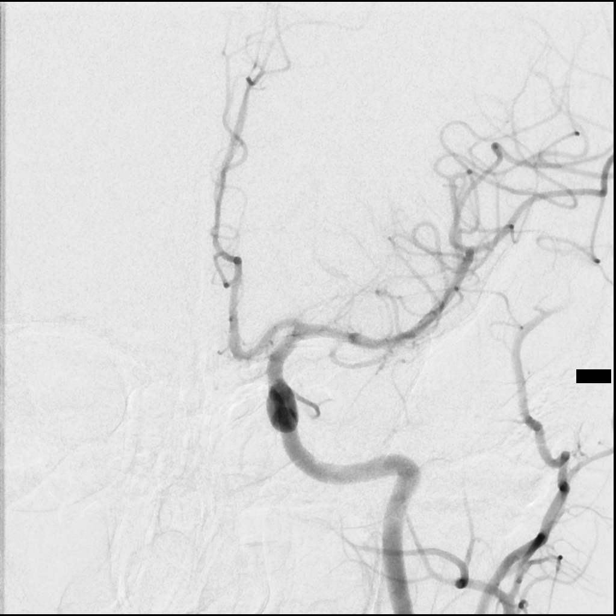
[im 228/295]
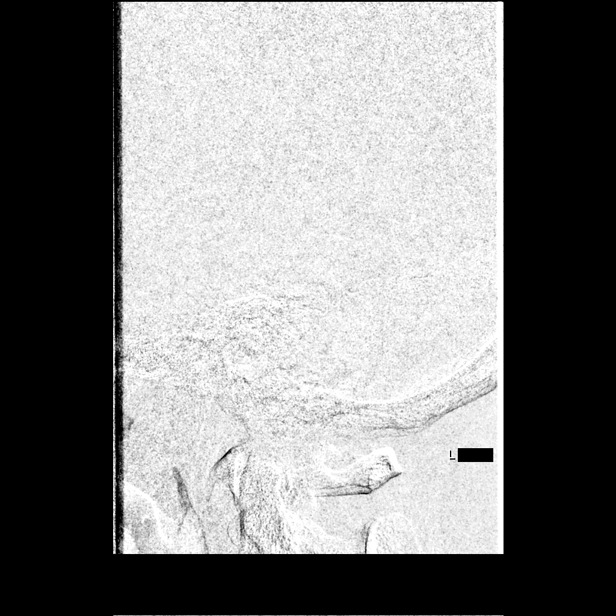
[im 254/295]
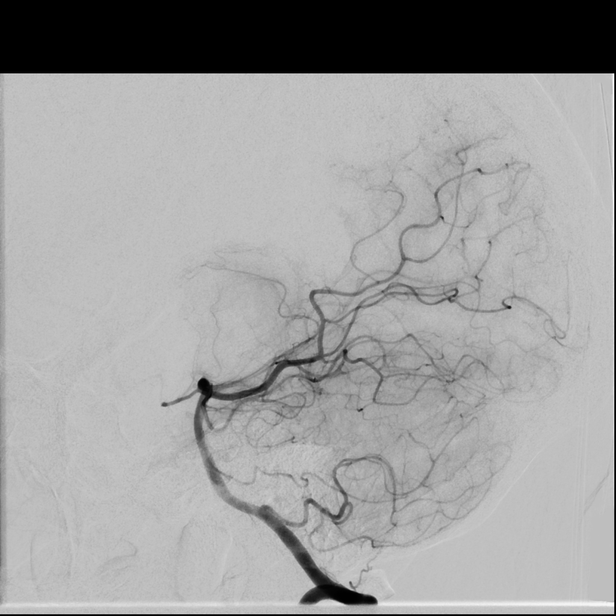
[im 281/295]
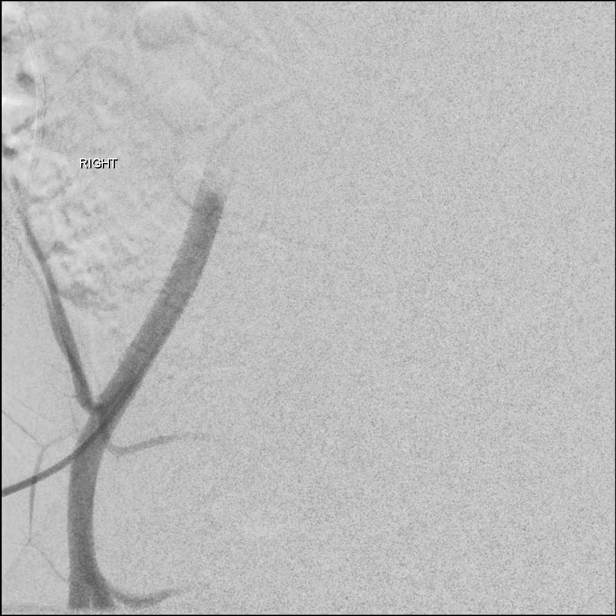

[11 of 24 positions shown; findings below may reference images not displayed]

MEDICATIONS:
Heparin [XW] units IV. No antibiotic was administered within 1 hour
of the procedure.

ANESTHESIA/SEDATION:
Versed 1.5 mg IV; Fentanyl a 37.5 mcg IV

Moderate Sedation Time:  57 minutes

The patient was continuously monitored during the procedure by the
interventional radiology nurse under my direct supervision.

CONTRAST:  [XW] approximately 70 mL

FLUOROSCOPY TIME:  Fluoroscopy Time: 8 minutes 6 seconds ([XW] mGy).

COMPLICATIONS:
None immediate.
The right groin was prepped and draped in the usual sterile fashion.
Using ultrasound guidance, access into the right common femoral
artery was obtained using the modified Seldinger technique with a
micropuncture set. Over a 0.035 inch guidewire, a 5 French Pinnacle
sheath was inserted. Through this, and also over 0.035 inch
guidewire, a 5 French JB 1 catheter was advanced to the aortic arch
region and selectively positioned in the right common carotid
artery, the right vertebral artery, the left common carotid artery
and the left vertebral artery.
FINDINGS: The innominate arteriogram demonstrates the origins of the right
subclavian artery and the right common carotid artery to be widely
patent.

The right common carotid arteriogram demonstrates the right external
carotid artery and its major branches to be widely patent.

The right internal carotid artery at the bulb to the cranial skull
base is also widely patent.

The petrous, cavernous and supraclinoid segments are widely patent.

The right middle cerebral artery and the right anterior cerebral
artery opacify into the capillary and venous phases.

The venous phase demonstrates predominant drainage via the right
transverse sinus, the right sigmoid sinus and the right internal
jugular vein.

Severe decrease in the caliber of the right transverse sinus in the
mid to distal segment and proximal sigmoid sinus is noted.

The right vertebral artery origin is widely patent.

The vessel opacifies to the cranial skull base. Patency is
maintained of the right vertebrobasilar junction and the right
posterior-inferior cerebellar artery.

The basilar artery, the posterior cerebral arteries, the superior
cerebellar arteries and the anterior-inferior cerebellar arteries
opacify into the capillary and venous phases. Unopacified blood is
seen in the basilar artery from the contralateral vertebral artery.

The venous phase demonstrates severe decrease in the caliber of the
distal right transverse sinus extending into the proximal sigmoid
sinus.

Similar finding is evident of the distal left transverse sinus with
the decrease in caliber extending into the proximal left sigmoid
sinus.

Left common carotid arteriogram demonstrates the left external
carotid artery and its major branches to be widely patent. The left
internal carotid artery at the bulb to the cranial skull base is
widely patent.

The petrous, the cavernous and the supraclinoid segments are widely
patent.

A short segment left posterior communicating artery is seen.

The left middle cerebral artery and the left anterior cerebral
artery opacify into the capillary and venous phases. The venous
phase again demonstrates a high-grade stenosis of the distal right
transverse sinus extending into the proximal sigmoid sinus with
similar finding in the left transverse sinus/left sigmoid sinus
junction.

The left vertebral origin is widely patent. The vessel opacifies to
the cranial skull base. Patency is seen of the left vertebrobasilar
junction and the left posterior-inferior cerebellar artery.

The basilar artery, the posterior cerebral arteries, the superior
cerebellar arteries and the anterior-inferior cerebellar arteries
opacify into the capillary and venous phases.

Again demonstrated is the high-grade decrease in caliber of the
transverse sinus/sigmoid sinus junctions bilaterally, with the right
being slightly more dominant.
IMPRESSION: Bilateral severe decrease in caliber of the distal right transverse
sinus/sigmoid sinus junction, and of the left distal transverse
sinus/sigmoid sinus junction suggestive of high-grade stenosis.

PLAN:
Findings reviewed with patient.

Brief consultation regarding this with her primary referring
neurologist.

Given the patient's previous history of idiopathic intracranial
hypertension, and the neuro imaging findings and the LP findings,
option of endovascular revascularization of the slightly more
dominant right transverse sinus/sigmoid sinus junction was
discussed. It is probable that the patient's symptoms are related to
increased venous hypertension related to the bilateral transverse
sinus/sigmoid sinus severe stenosis.

By improving of the outflow, it would likely decrease increased
venous hypertension, and probably provide relief of symptoms related
to the increased venous pressure. This was also decrease the chance
of retrograde venous hemorrhages, and venous strokes, and also
decrease the threat of blindness.

Briefly the endovascular procedure was reviewed with patient. The
low risks of intracranial hemorrhage, subarachnoid hemorrhage, and
subdural hemorrhage, and remotely of death were reviewed.

Patient has expressed her desire to proceed with the endovascular
treatment as described above of the right transverse sinus/sigmoid
sinus severe stenosis.

The endovascular treatment will be scheduled in the very near
future. Should the patient's symptoms worsen, such as blurred
vision, double vision and blindness, the patient was asked to call
[phone].

## 2020-04-02 MED ORDER — HEPARIN SODIUM (PORCINE) 1000 UNIT/ML IJ SOLN
INTRAMUSCULAR | Status: AC
  Filled 2020-04-02: qty 1

## 2020-04-02 MED ORDER — SODIUM CHLORIDE 0.9 % IV SOLN: INTRAVENOUS | Status: DC

## 2020-04-02 MED ORDER — LIDOCAINE HCL (PF) 1 % IJ SOLN
INTRAMUSCULAR | Status: AC | PRN
  Administered 2020-04-02: 20 mL

## 2020-04-02 MED ORDER — IOHEXOL 300 MG/ML  SOLN
150.0000 mL | Freq: Once | INTRAMUSCULAR | Status: AC | PRN
  Administered 2020-04-02: 78 mL via INTRA_ARTERIAL

## 2020-04-02 MED ORDER — VERAPAMIL HCL 2.5 MG/ML IV SOLN
INTRAVENOUS | Status: AC
  Filled 2020-04-02: qty 2

## 2020-04-02 MED ORDER — IOHEXOL 300 MG/ML  SOLN: 50.0000 mL | Freq: Once | INTRAMUSCULAR | Status: DC | PRN

## 2020-04-02 MED ORDER — HEPARIN SODIUM (PORCINE) 1000 UNIT/ML IJ SOLN
INTRAMUSCULAR | Status: AC | PRN
  Administered 2020-04-02 (×2): 500 [IU] via INTRAVENOUS

## 2020-04-02 MED ORDER — MIDAZOLAM HCL 2 MG/2ML IJ SOLN
INTRAMUSCULAR | Status: AC | PRN
  Administered 2020-04-02: 1 mg via INTRAVENOUS
  Administered 2020-04-02: 0.5 mg via INTRAVENOUS

## 2020-04-02 MED ORDER — MIDAZOLAM HCL 2 MG/2ML IJ SOLN
INTRAMUSCULAR | Status: AC
  Filled 2020-04-02: qty 2

## 2020-04-02 MED ORDER — FENTANYL CITRATE (PF) 100 MCG/2ML IJ SOLN
INTRAMUSCULAR | Status: AC
  Filled 2020-04-02: qty 2

## 2020-04-02 MED ORDER — FENTANYL CITRATE (PF) 100 MCG/2ML IJ SOLN
INTRAMUSCULAR | Status: AC | PRN
Start: 2020-04-02 — End: 2020-04-02
  Administered 2020-04-02: 12.5 ug via INTRAVENOUS
  Administered 2020-04-02: 25 ug via INTRAVENOUS

## 2020-04-02 MED ORDER — NITROGLYCERIN 1 MG/10 ML FOR IR/CATH LAB
INTRA_ARTERIAL | Status: AC
  Filled 2020-04-02: qty 10

## 2020-04-02 MED ORDER — SODIUM CHLORIDE 0.9 % IV SOLN: INTRAVENOUS | Status: AC

## 2020-04-02 MED ORDER — LIDOCAINE HCL 1 % IJ SOLN
INTRAMUSCULAR | Status: AC
  Filled 2020-04-02: qty 20

## 2020-04-02 NOTE — Discharge Instructions (Signed)
Angiogram, Care After This sheet gives you information about how to care for yourself after your procedure. Your health care provider may also give you more specific instructions. If you have problems or questions, contact your health care provider. What can I expect after the procedure? After the procedure, it is common to have bruising and tenderness at the catheter insertion area. Follow these instructions at home: Insertion site care  Follow instructions from your health care provider about how to take care of your insertion site. Make sure you: ? Wash your hands with soap and water before you change your bandage (dressing). If soap and water are not available, use hand sanitizer. ? Change your dressing as told by your health care provider. ? Leave stitches (sutures), skin glue, or adhesive strips in place. These skin closures may need to stay in place for 2 weeks or longer. If adhesive strip edges start to loosen and curl up, you may trim the loose edges. Do not remove adhesive strips completely unless your health care provider tells you to do that.  Do not take baths, swim, or use a hot tub until your health care provider approves.  You may shower 24-48 hours after the procedure or as told by your health care provider. ? Gently wash the site with plain soap and water. ? Pat the area dry with a clean towel. ? Do not rub the site. This may cause bleeding.  Do not apply powder or lotion to the site. Keep the site clean and dry.  Check your insertion site every day for signs of infection. Check for: ? Redness, swelling, or pain. ? Fluid or blood. ? Warmth. ? Pus or a bad smell. Activity  Rest as told by your health care provider, usually for 1-2 days.  Do not lift anything that is heavier than 10 lbs. (4.5 kg) or as told by your health care provider.  Do not drive for 24 hours if you were given a medicine to help you relax (sedative).  Do not drive or use heavy machinery while  taking prescription pain medicine. General instructions   Return to your normal activities as told by your health care provider, usually in about a week. Ask your health care provider what activities are safe for you.  If the catheter site starts bleeding, lie flat and put pressure on the site. If the bleeding does not stop, get help right away. This is a medical emergency.  Drink enough fluid to keep your urine clear or pale yellow. This helps flush the contrast dye from your body.  Take over-the-counter and prescription medicines only as told by your health care provider.  Keep all follow-up visits as told by your health care provider. This is important. Contact a health care provider if:  You have a fever or chills.  You have redness, swelling, or pain around your insertion site.  You have fluid or blood coming from your insertion site.  The insertion site feels warm to the touch.  You have pus or a bad smell coming from your insertion site.  You have bruising around the insertion site.  You notice blood collecting in the tissue around the catheter site (hematoma). The hematoma may be painful to the touch. Get help right away if:  You have severe pain at the catheter insertion area.  The catheter insertion area swells very fast.  The catheter insertion area is bleeding, and the bleeding does not stop when you hold steady pressure on the area.    The area near or just beyond the catheter insertion site becomes pale, cool, tingly, or numb. These symptoms may represent a serious problem that is an emergency. Do not wait to see if the symptoms will go away. Get medical help right away. Call your local emergency services (911 in the [place] Do not drive yourself to the hospital. Summary  After the procedure, it is common to have bruising and tenderness at the catheter insertion area.  After the procedure, it is important to rest and drink plenty of fluids.  Do not take baths,  swim, or use a hot tub until your health care provider says it is okay to do so. You may shower 24-48 hours after the procedure or as told by your health care provider.  If the catheter site starts bleeding, lie flat and put pressure on the site. If the bleeding does not stop, get help right away. This is a medical emergency. This information is not intended to replace advice given to you by your health care provider. Make sure you discuss any questions you have with your health care provider. Document Revised: 03/26/2017 Document Reviewed: 03/18/2016 [employer] Patient Education  2020 [employer]  

## 2020-04-02 NOTE — Sedation Documentation (Signed)
Co2 detector removed per Dr. Corliss Skains for imaging purposes.

## 2020-04-02 NOTE — Sedation Documentation (Signed)
Patient transported to short stay. Alvino Chapel RN at the bedside to assess groin site. Clean, dry and intact. No bleeding or drainage noted from dressing. Soft to palpation. Intact distal pulses +2 pedals and doppler PT pulses

## 2020-04-02 NOTE — H&P (Signed)
Chief Complaint: Patient was seen in consultation today for cerebral angiogram at the request of Dr Huston Foley   Supervising Physician: Julieanne Cotton  Patient Status: [facility] - Out-pt  History of Present Illness: Brenda Molina is a 34 y.o. female   Headaches x 3-4 months Daily headaches; can be debilitating Was seen with Dr Frances Furbish- Neurology Dx: idiopathic intracranial hypertension Was seen in ED and previously by Ophthalmologist-- + B Papilledema MRI/MRV 02/22/20: IMPRESSION: 1. Technically limited exam due to extensive susceptibility artifact related to dental hardware. 2. Subtle bulging of the optic discs at the posterior aspects of both globes, left more prominent than right, consistent with papilledema. 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 Constellation of findings can be seen in the setting of idiopathic intracranial hypertension. Correlation with LP and opening pressures could be performed for further evaluation as warranted. 3. Otherwise normal MRI of the brain and orbits. No other acute abnormality identified. No definite evidence for acute optic neuritis. 4. Otherwise normal intracranial MRV. No evidence for dural sinus thrombosis.  LP 10/28: opening pressure 55 Lowered to teens after removal 15 cc clear CSF. Previous LP had opening pressure 44-- also relieved after gradual CSF removal. 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 Recommendation of cerebral angiogram for evaluation asap Scheduled today for same.     Past Medical History:  Diagnosis Date  . Allergy   . Anxiety   . PCOS (polycystic ovarian syndrome)     Past Surgical History:  Procedure Laterality Date  . IR RADIOLOGIST EVAL & MGMT  02/28/2020  . WISDOM TOOTH EXTRACTION      Allergies: Patient has no  known allergies.  Medications: Prior to Admission medications   Medication Sig Start Date End Date Taking? Authorizing Provider  acetaminophen (TYLENOL) 650 MG CR tablet Take 650-1,300 mg by mouth every 8 (eight) hours as needed for pain.    Yes [provider]  acetaZOLAMIDE (DIAMOX) 500 MG capsule Take 1 pill in AM and 2 pills at night. Patient taking differently: Take 500-1,000 mg by mouth See admin instructions. 500 mg in the morning, 1000 mg in the evening 03/19/20  Yes Huston Foley, MD  ascorbic acid (VITAMIN C) 500 MG tablet Take 500 mg by mouth daily.   Yes [provider]  b complex vitamins capsule Take 1 capsule by mouth daily.   Yes [provider]  cetirizine (ZYRTEC) 10 MG tablet Take 10 mg by mouth daily as needed for allergies.   Yes [provider]  levonorgestrel (MIRENA, 52 MG,) 20 MCG/24HR IUD 1 each by Intrauterine route once.    Yes [provider]  busPIRone (BUSPAR) 7.5 MG tablet TAKE 1 TABLET BY MOUTH TWICE A DAY Patient not taking: Reported on 03/28/2020 11/03/19   Shade Flood, MD  fluticasone [facility]) 50 MCG/ACT nasal spray Place 2 sprays into both nostrils daily. Patient taking differently: Place 2 sprays into both nostrils daily as needed for allergies.  12/09/17   Weber, Sarah L, PA-C  ondansetron (ZOFRAN) 4 MG tablet Take 1 tablet (4 mg total) by mouth every 8 (eight) hours as needed for nausea or vomiting. Patient not taking: Reported on 03/28/2020 02/20/20   Huston Foley, MD  sertraline (ZOLOFT) 100 MG tablet TAKE 1 TABLET BY MOUTH DAILY. START 1/2 PILL FOR 1 WEEK., THEN INCREASE TO FULL PILL IF  TOLERATED. Patient not taking: Reported on 03/28/2020 11/03/19   Shade Flood, MD     Family History  Problem Relation Age of Onset  . Asthma Father   . Hypertension Father     Social History   Socioeconomic History  . Marital status: Single    Spouse name: Not on file  . Number of children: 1  . Years of  education: Not on file  . Highest education level: Not on file  Occupational History  . Not on file  Tobacco Use  . Smoking status: Never Smoker  . Smokeless tobacco: Never Used  Vaping Use  . Vaping Use: Never used  Substance and Sexual Activity  . Alcohol use: Yes    Comment: occ  . Drug use: No  . Sexual activity: Yes    Partners: Male    Birth control/protection: Condom  Other Topics Concern  . 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 Has no children. 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 Difficulty of Paying Living Expenses: Not on file  Food Insecurity:   . Worried About Programme researcher, broadcasting/film/video in the Last Year: Not on file  . Ran Out of Food in the Last Year: Not on file  Transportation Needs:   . Lack of Transportation (Medical): Not on file  . Lack of Transportation (Non-Medical): Not on file  Physical Activity:   . Days of Exercise per Week: Not on file  . Minutes of Exercise per Session: Not on file  Stress:   . Feeling of Stress : Not on file  Social Connections:   . Frequency of Communication with Friends and Family: Not on file  . Frequency of Social Gatherings with Friends and Family: Not on file  . Attends Religious Services: Not on file  . Active Member of Clubs or Organizations: Not on file  . Attends Banker Meetings: Not on file  . Marital Status: Not on file    Review of Systems: A 12 point ROS discussed and pertinent positives are indicated in the HPI above.  All other systems are negative.  Review of Systems  Constitutional: Negative for activity change, fatigue, fever and unexpected weight change.  HENT: Negative for tinnitus, trouble swallowing and voice change.   Eyes: Negative for visual disturbance.  Respiratory: Negative for cough and shortness of breath.   Cardiovascular:  Negative for chest pain.  Gastrointestinal: Negative for abdominal pain, nausea and vomiting.  Musculoskeletal: Negative for back pain, gait problem, neck pain and neck stiffness.  Neurological: Positive for headaches. Negative for dizziness, tremors, seizures, syncope, facial asymmetry, speech difficulty, weakness, light-headedness and numbness.  Psychiatric/Behavioral: Negative for behavioral problems and confusion.    Vital Signs: BP 120/79   Pulse 81   Temp 98.2 F (36.8 C) (Oral)   Resp 14   Ht 5\' 3"  (1.6 m)   Wt 245 lb (111.1 kg)   SpO2 99%   BMI 43.40 kg/m   Physical Exam Vitals reviewed.  HENT:     Head: Atraumatic.     Mouth/Throat:     Mouth: Mucous membranes are moist.  Eyes:     Extraocular Movements: Extraocular movements intact.  Cardiovascular:     Rate and Rhythm: Normal rate and regular rhythm.     Heart sounds: Normal heart  sounds.  Pulmonary:     Effort: Pulmonary effort is normal.     Breath sounds: Normal breath sounds.  Abdominal:     Palpations: Abdomen is soft.     Tenderness: There is no abdominal tenderness.  Musculoskeletal:        General: Normal range of motion.     Cervical back: Normal range of motion.     Right lower leg: No edema.     Left lower leg: No edema.  Skin:    General: Skin is warm.  Neurological:     Mental Status: She is oriented to person, place, and time.  Psychiatric:        Behavior: Behavior normal.        Thought Content: Thought content normal.        Judgment: Judgment normal.     Imaging: Home sleep test  Result Date: 03/18/2020 Huston Foley, MD     03/29/2020  9:14 AM  [facility] HOME SLEEP TEST (Watch PAT) STUDY DATE: 03/18/20 DOB: [birth year] MRN: [id number] ORDERING CLINICIAN: Huston Foley, MD, PhD  REFERRING CLINICIAN: Shade Flood, MD CLINICAL INFORMATION/HISTORY: 34 year old woman with a history of PCOS, anxiety, allergies, and severe obesity with a BMI of over 40, who has a  history of idiopathic intracranial hypertension. She is at risk for OSA. BMI: 43.05 kg/m FINDINGS: Total Record Time (hours, min): 6 H 26 min Total Sleep Time (hours, min):  5 H 26 min Percent REM (%):    31.42 % Calculated pAHI (per hour):  7.3       REM pAHI:    19.1     NREM pAHI: 3.0  Oxygen Saturation (%) Mean:  96  Minimum oxygen saturation (%):        84 O2 Saturation Range (%): 58-104  O2Saturation (minutes) <=88%: .2 min Pulse Mean (bpm):    74  Pulse Range (58-104) IMPRESSION: OSA (obstructive sleep apnea), mild RECOMMENDATION: This home sleep test demonstrates overall mild obstructive sleep apnea with a total AHI of 7.3/hour and O2 nadir of 84%.  Mild intermittent snoring was noted.  Given the patient's medical history and sleep related complaints, treatment with positive airway pressure is recommended. This can be achieved in the form of autoPAP trial/titration at home. A full night CPAP titration study will help with proper treatment settings and mask fitting if needed. Alternative treatments include weight loss along with avoidance of the supine sleep position, or an oral appliance in appropriate candidates.  Please note that untreated obstructive sleep apnea may carry additional perioperative morbidity. Patients with significant obstructive sleep apnea should receive perioperative PAP therapy and the surgeons and particularly the anesthesiologist should be informed of the diagnosis and the severity of the sleep disordered breathing. The patient should be cautioned not to drive, work at heights, or operate dangerous or heavy equipment when tired or sleepy. Review and reiteration of good sleep hygiene measures should be pursued with any patient. Other causes of the patient's symptoms, including circadian rhythm disturbances, an underlying mood disorder, medication effect and/or an underlying medical problem cannot be ruled out based on this test. Clinical correlation is recommended. The patient will be  seen in follow up in sleep clinic at [facility]. I certify that I have reviewed the raw data recording prior to the issuance of this report in accordance with the standards of the American Academy of Sleep Medicine (AASM). INTERPRETING PHYSICIAN: Huston Foley, MD, PhD Board Certified in Neurology and Sleep Medicine [facility]  [address], [address] [place], [place] [address] [phone]    Labs:  CBC: Recent Labs    02/15/20 1313  WBC 10.9*  HGB 14.4  HCT 44.0  PLT 310    COAGS: No results for input(s): INR, APTT in the last 8760 hours.  BMP: Recent Labs    02/15/20 1313  NA 138  K 3.4*  CL 101  CO2 26  GLUCOSE 103*  BUN 12  CALCIUM 9.3  CREATININE 0.78  GFRNONAA >60    LIVER FUNCTION TESTS: No results for input(s): BILITOT, AST, ALT, ALKPHOS, PROT, ALBUMIN in the last 8760 hours.  TUMOR MARKERS: No results for input(s): AFPTM, CEA, CA199, CHROMGRNA in the last 8760 hours.  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  This interventional procedure involves the use of X-rays and because of the nature of the planned procedure, it is possible that we will have prolonged use of X-ray fluoroscopy.  Potential radiation risks to you include (but are not limited to) the following: - A slightly elevated risk for cancer  several years later in life. This risk is typically less than 0.5% percent. This risk is low in comparison to the normal incidence of human cancer, which is 33% for women and 50% for men according to the American Cancer Society. - Radiation induced injury can include skin redness, resembling a  rash, tissue breakdown / ulcers and hair loss (which can be temporary or permanent).   The likelihood of either of these occurring depends on the difficulty of the procedure and whether you are sensitive to radiation due to previous procedures, disease, or genetic conditions.   IF your procedure requires a prolonged use of radiation, you will be notified and given written instructions for further action.  It is your responsibility to monitor the irradiated area for the 2 weeks following the procedure and to notify your physician if you are concerned that you have suffered a radiation induced injury.    All of the patient's questions were answered, patient is agreeable to proceed.  Consent signed and in chart.  Thank you for this interesting consult.  I greatly enjoyed meeting Brenda Molina and look forward to participating in their care.  A copy of this report was sent to the requesting provider on this date.  Electronically Signed: Robet LeuPamela A Koree Staheli, PA-C 04/02/2020, 9:54 AM   I spent a total of  30 Minutes   in face to face in clinical consultation, greater than 50% of which was counseling/coordinating care for cerebral angiogram

## 2020-04-02 NOTE — Procedures (Signed)
S/P 4 vessel cerebral arteriogram RT CFA approach. Findings. 1.Bilateral severe TS/SS stenosis. S.Creedence Heiss MD

## 2020-04-03 ENCOUNTER — Other Ambulatory Visit (HOSPITAL_COMMUNITY): Payer: Self-pay | Admitting: Interventional Radiology

## 2020-04-03 DIAGNOSIS — R519 Headache, unspecified: Secondary | ICD-10-CM

## 2020-04-04 ENCOUNTER — Telehealth: Payer: Self-pay | Admitting: Neurology

## 2020-04-04 ENCOUNTER — Encounter (HOSPITAL_COMMUNITY): Payer: Self-pay

## 2020-04-04 DIAGNOSIS — G932 Benign intracranial hypertension: Secondary | ICD-10-CM | POA: Diagnosis not present

## 2020-04-04 MED ORDER — ACETAZOLAMIDE ER 500 MG PO CP12
1000.0000 mg | ORAL_CAPSULE | Freq: Two times a day (BID) | ORAL | 3 refills | Status: DC
Start: 2020-04-04 — End: 2020-10-07

## 2020-04-04 NOTE — Telephone Encounter (Signed)
Spoke with patient and advised her of Dr Teofilo Pod detailed message. She asked if she can continue to use her 500 mg tabs and take two twice a day. I advised she may, but to continue with new Rx when she runs out of 500 mg tabs. Advised she start Auto Pap; she stated she is waiting for a call back to get her fitted, verbalized agreement, understanding, appreciation.

## 2020-04-04 NOTE — Telephone Encounter (Signed)
Please call patient and advise you that you got a note from ophthalmologist about worsening swelling of her eye nerve called papilledema and worsening of her vision field.  She had an angiogram yesterday.  I would like to be aggressive with her medical management for now and increase her Diamox to 1000 mg twice daily which is pretty much the maximum dose.  I changed her prescription.  Again, I would also like for her to start AutoPap therapy for her sleep apnea.

## 2020-04-08 ENCOUNTER — Encounter (HOSPITAL_COMMUNITY): Payer: Self-pay

## 2020-04-08 ENCOUNTER — Other Ambulatory Visit (HOSPITAL_COMMUNITY): Payer: Self-pay | Admitting: Interventional Radiology

## 2020-04-08 DIAGNOSIS — G932 Benign intracranial hypertension: Secondary | ICD-10-CM

## 2020-04-10 DIAGNOSIS — R69 Illness, unspecified: Secondary | ICD-10-CM | POA: Diagnosis not present

## 2020-04-25 ENCOUNTER — Other Ambulatory Visit (HOSPITAL_COMMUNITY): Payer: Self-pay | Admitting: Student

## 2020-04-25 DIAGNOSIS — G932 Benign intracranial hypertension: Secondary | ICD-10-CM

## 2020-04-25 MED ORDER — CLOPIDOGREL BISULFATE 75 MG PO TABS: 75.0000 mg | ORAL_TABLET | Freq: Every day | ORAL | 0 refills | Status: DC

## 2020-04-25 NOTE — Progress Notes (Signed)
Patient to start Plavix prior to scheduled procedure 05/02/19 with Dr. Corliss Skains.   Per Dr. Corliss Skains, patient instructed to take 300 mg Plavix today, then 75 mg Plavix daily including the day of her procedure.  Patient also to start 325 mg aspirin today and continue through the day of procedure.   Prescription called into [facility] [address].   Patient verbalizes understanding.  Also discussed plans for Covid testing and P2y12 lab draw Monday.   Loyce Dys, MS RD PA-C

## 2020-04-29 ENCOUNTER — Other Ambulatory Visit (HOSPITAL_COMMUNITY): Payer: Self-pay

## 2020-04-29 ENCOUNTER — Encounter (HOSPITAL_COMMUNITY): Payer: Self-pay | Admitting: Interventional Radiology

## 2020-04-29 ENCOUNTER — Other Ambulatory Visit (HOSPITAL_COMMUNITY): Payer: Self-pay | Admitting: Student

## 2020-04-29 ENCOUNTER — Telehealth (HOSPITAL_COMMUNITY): Payer: Self-pay | Admitting: Student

## 2020-04-29 ENCOUNTER — Other Ambulatory Visit (HOSPITAL_COMMUNITY)
Admission: RE | Admit: 2020-04-29 | Discharge: 2020-04-29 | Disposition: A | Discharge status: Home / Self Care | Payer: No Typology Code available for payment source | Source: Ambulatory Visit | Attending: Interventional Radiology | Admitting: Interventional Radiology

## 2020-04-29 DIAGNOSIS — Z01812 Encounter for preprocedural laboratory examination: Secondary | ICD-10-CM | POA: Insufficient documentation

## 2020-04-29 DIAGNOSIS — Z20822 Contact with and (suspected) exposure to covid-19: Secondary | ICD-10-CM | POA: Insufficient documentation

## 2020-04-29 DIAGNOSIS — G932 Benign intracranial hypertension: Secondary | ICD-10-CM

## 2020-04-29 LAB — PLATELET INHIBITION P2Y12: Platelet Function  P2Y12: 136 [PRU] — ABNORMAL LOW (ref 182–335)

## 2020-04-29 MED ORDER — CLOPIDOGREL BISULFATE 75 MG PO TABS: 75.0000 mg | ORAL_TABLET | Freq: Every day | ORAL | 0 refills | Status: DC

## 2020-04-29 NOTE — Telephone Encounter (Signed)
Patient with scheduled Neuro IR procedure with Dr. Corliss Skains 05/01/20. P2Y12 level checked today is 136. Per Dr. Corliss Skains, patient needs to take an extra plavix today, take two plavix tablets tomorrow (150 mg total) and take one plavix Wednesday, the morning of the procedure.   This information was shared with Brenda Molina who verbalized understanding of these instructions. A prescription for more plavix was [facility]prescribed to the [facility] [address]. Brenda Molina has the number to the IR APP office at [facility] and she knows she can call us with any questions prior to her procedure.  Brenda Molina, [place] [phone] 04/29/2020, 3:20 PM

## 2020-04-29 NOTE — Progress Notes (Signed)
PCP:  Meredith Staggers, MD Cardiologist:  Denies  EKG:  N/A CXR:  06/09/15 ECHO:  06/09/15 Stress Test:  Denies Cardiac Cath:  Denies  Fasting Blood Sugar-  N/A Checks Blood Sugar_N/A__ times a day  OSA: Yes, recently diagnosed. CPAP:  Awaiting fitting  ASA/Blood Thinners:  Per Dr. Titus Dubin, take Plavix and ASA DOS.  Covid test 04/29/20  Anesthesia Review:  NO  Patient denies shortness of breath, fever, cough, and chest pain at PAT appointment.  Patient verbalized understanding of instructions provided today at the PAT appointment.  Patient asked to review instructions at home and day of surgery.

## 2020-04-30 ENCOUNTER — Other Ambulatory Visit: Payer: Self-pay | Admitting: Student

## 2020-04-30 LAB — SARS CORONAVIRUS 2 (TAT 6-24 HRS): SARS Coronavirus 2: NEGATIVE

## 2020-04-30 NOTE — H&P (Signed)
Chief Complaint: Patient was seen in consultation today for right transverse sinus/sigmoid sinus junction stenosis   Referring Physician(s): Deveshwar, Simonne Maffucci  Supervising Physician: Julieanne Cotton  Patient Status: [facility] - Out-pt  History of Present Illness: Brenda Molina is a 35 y.o. female with a medical history significant for anxiety, PCOS and headaches. Her headaches have become more severe and accompanied by blurred vision and right-sided pulsatile tinnitus. She presented to IR 04/03/20 for a diagnostic cerebral angiogram with Dr. Corliss Skains with findings positive for "bilateral severe decrease in caliber of the distal right transverse sinus/sigmoid sinus junction, and of the left distal transverse sinus/sigmoid sinus junction suggestive of high-grade stenosis".  Post-procedure Dr. Corliss Skains discussed his findings and recommended treatment options including revascularization. Brenda Molina was in agreement to proceed and she presents today for an image-guided cerebral angiogram with revascularization/angioplasty/stent of right transverse sinus/sigmoid sinus junction stenosis.   Past Medical History:  Diagnosis Date  . Allergy   . Anxiety   . GERD (gastroesophageal reflux disease)   . PCOS (polycystic ovarian syndrome)   . Sleep apnea     Past Surgical History:  Procedure Laterality Date  . IR ANGIO INTRA EXTRACRAN SEL COM CAROTID INNOMINATE BILAT MOD SED  04/02/2020  . IR ANGIO VERTEBRAL SEL VERTEBRAL BILAT MOD SED  04/02/2020  . IR RADIOLOGIST EVAL & MGMT  02/28/2020  . WISDOM TOOTH EXTRACTION      Allergies: Patient has no known allergies.  Medications: Prior to Admission medications   Medication Sig Start Date End Date Taking? Authorizing Provider  acetaminophen (TYLENOL) 650 MG CR tablet Take 650-1,300 mg by mouth every 8 (eight) hours as needed for pain.     [provider]  acetaZOLAMIDE (DIAMOX) 500 MG capsule Take 2 capsules (1,000 mg total) by mouth  2 (two) times daily. 04/04/20   Huston Foley, MD  ascorbic acid (VITAMIN C) 500 MG tablet Take 500 mg by mouth daily.    [provider]  b complex vitamins capsule Take 1 capsule by mouth daily.    [provider]  busPIRone (BUSPAR) 7.5 MG tablet TAKE 1 TABLET BY MOUTH TWICE A DAY Patient not taking: Reported on 04/23/2020 11/03/19   Shade Flood, MD  cetirizine (ZYRTEC) 10 MG tablet Take 10 mg by mouth daily as needed for allergies.    [provider]  clopidogrel (PLAVIX) 75 MG tablet Take 1 tablet (75 mg total) by mouth daily. 04/29/20   Mickie Kay, NP  fluticasone (FLONASE) 50 MCG/ACT nasal spray Place 2 sprays into both nostrils daily. Patient taking differently: Place 2 sprays into both nostrils daily as needed for allergies. 12/09/17   Weber, Dema Severin, PA-C  levonorgestrel (MIRENA, 52 MG,) 20 MCG/24HR IUD 1 each by Intrauterine route once.     [provider]  ondansetron (ZOFRAN) 4 MG tablet Take 1 tablet (4 mg total) by mouth every 8 (eight) hours as needed for nausea or vomiting. Patient not taking: No sig reported 02/20/20   Huston Foley, MD  sertraline (ZOLOFT) 100 MG tablet TAKE 1 TABLET BY MOUTH DAILY. START 1/2 PILL FOR 1 WEEK., THEN INCREASE TO FULL PILL IF TOLERATED. Patient not taking: Reported on 04/23/2020 11/03/19   Shade Flood, MD     Family History  Problem Relation Age of Onset  . Asthma Father   . Hypertension Father     Social History   Socioeconomic History  . Marital status: Single    Spouse name: Not on file  .  Number of children: 1  . Years of education: Not on file  . Highest education level: Not on file  Occupational History  . Not on file  Tobacco Use  . Smoking status: Never Smoker  . Smokeless tobacco: Never Used  Vaping Use  . Vaping Use: Never used  Substance and Sexual Activity  . Alcohol use: Yes    Comment: occ  . Drug use: No  . Sexual activity: Yes    Partners: Male    Birth  control/protection: Condom  Other Topics Concern  . 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 Has no children. 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  All other systems are negative.  Review of Systems  Constitutional: Negative for appetite change and fatigue.  Eyes: Positive for visual disturbance.       Occasional blurred vision  Respiratory: Negative for cough and shortness of breath.   Cardiovascular: Negative for chest pain and leg swelling.  Gastrointestinal: Negative for abdominal pain, diarrhea, nausea and vomiting.  Musculoskeletal: Negative for back pain.  Neurological: Positive for dizziness and numbness. Negative for headaches.       Dizziness with moderate activity; left leg numbness at times.   Psychiatric/Behavioral: Negative for confusion.    Vital Signs: Temp: 98.3; BP 145/68; HR 80; RR 17; 99% O2 on Room Air  Physical Exam Constitutional:      General: She is not in acute distress. HENT:     Mouth/Throat:     Mouth: Mucous membranes are moist.     Pharynx: Oropharynx is clear.  Cardiovascular:     Rate and Rhythm: Normal rate and regular rhythm.     Pulses: Normal pulses.     Heart sounds: Normal heart sounds.  Pulmonary:     Effort: Pulmonary effort is normal.     Breath sounds: Normal breath sounds.  Abdominal:     General: Bowel sounds are normal.     Palpations: Abdomen is soft.     Tenderness: There is no abdominal tenderness.  Musculoskeletal:        General: Normal range of motion.     Cervical back: Normal range of motion.   Skin:    General: Skin is warm and dry.  Neurological:     Mental Status: She is alert and oriented to person, place, and time.  Psychiatric:        Mood and Affect: Mood normal.        Behavior: Behavior normal.        Thought Content: Thought content normal.        Judgment: Judgment normal.     Imaging: IR ANGIO INTRA EXTRACRAN SEL COM CAROTID INNOMINATE BILAT MOD SED  Result Date: 04/03/2020 CLINICAL DATA:  History of worsening headaches, blurred vision and right-sided pulsatile tinnitus. History of idiopathic intracranial hypertension. Abnormal MRV of the brain. EXAM: BILATERAL COMMON CAROTID AND INNOMINATE ANGIOGRAPHY COMPARISON:  MRV of the brain of February 15, 2020. MEDICATIONS: Heparin 1000 units IV. No antibiotic was administered within 1  hour of the procedure. ANESTHESIA/SEDATION: Versed 1.5 mg IV; Fentanyl a 37.5 mcg IV Moderate Sedation Time:  57 minutes The patient was continuously monitored during the procedure by the interventional radiology nurse under my direct supervision. CONTRAST:  Isovue-300 approximately 70 mL FLUOROSCOPY TIME:  Fluoroscopy Time: 8 minutes 6 seconds (1319 mGy). COMPLICATIONS: None immediate. TECHNIQUE: Informed written consent was obtained from the patient after a thorough discussion of the procedural risks, benefits and alternatives. All questions were addressed. Maximal Sterile Barrier Technique was utilized including caps, mask, sterile gowns, sterile gloves, sterile drape, hand hygiene and skin antiseptic. A timeout was performed prior to the initiation of the procedure. The right groin was prepped and draped in the usual sterile fashion. Using ultrasound guidance, access into the right common femoral artery was obtained using the modified Seldinger technique with a micropuncture set. Over a 0.035 inch guidewire, a 5 French Pinnacle sheath was inserted. Through this, and also over 0.035 inch guidewire, a 5 [place] JB 1 catheter was advanced to the aortic  arch region and selectively positioned in the right common carotid artery, the right vertebral artery, the left common carotid artery and the left vertebral artery. FINDINGS: The innominate arteriogram demonstrates the origins of the right subclavian artery and the right common carotid artery to be widely patent. The right common carotid arteriogram demonstrates the right external carotid artery and its major branches to be widely patent. The right internal carotid artery at the bulb to the cranial skull base is also widely patent. The petrous, cavernous and supraclinoid segments are widely patent. The right middle cerebral artery and the right anterior cerebral artery opacify into the capillary and venous phases. The venous phase demonstrates predominant drainage via the right transverse sinus, the right sigmoid sinus and the right internal jugular vein. Severe decrease in the caliber of the right transverse sinus in the mid to distal segment and proximal sigmoid sinus is noted. The right vertebral artery origin is widely patent. The vessel opacifies to the cranial skull base. Patency is maintained of the right vertebrobasilar junction and the right posterior-inferior cerebellar artery. The basilar artery, the posterior cerebral arteries, the superior cerebellar arteries and the anterior-inferior cerebellar arteries opacify into the capillary and venous phases. Unopacified blood is seen in the basilar artery from the contralateral vertebral artery. The venous phase demonstrates severe decrease in the caliber of the distal right transverse sinus extending into the proximal sigmoid sinus. Similar finding is evident of the distal left transverse sinus with the decrease in caliber extending into the proximal left sigmoid sinus. Left common carotid arteriogram demonstrates the left external carotid artery and its major branches to be widely patent. The left internal carotid artery at the bulb to the cranial skull base  is widely patent. The petrous, the cavernous and the supraclinoid segments are widely patent. A short segment left posterior communicating artery is seen. The left middle cerebral artery and the left anterior cerebral artery opacify into the capillary and venous phases. The venous phase again demonstrates a high-grade stenosis of the distal right transverse sinus extending into the proximal sigmoid sinus with similar finding in the left transverse sinus/left sigmoid sinus junction. The left vertebral origin is widely patent. The vessel opacifies to the cranial skull base. Patency is seen of the left vertebrobasilar junction and the left posterior-inferior cerebellar artery. The basilar artery, the posterior cerebral arteries, the superior cerebellar arteries and the anterior-inferior cerebellar arteries opacify into the capillary and venous phases. Again demonstrated is the high-grade  decrease in caliber of the transverse sinus/sigmoid sinus junctions bilaterally, with the right being slightly more dominant. IMPRESSION: Bilateral severe decrease in caliber of the distal right transverse sinus/sigmoid sinus junction, and of the left distal transverse sinus/sigmoid sinus junction suggestive of high-grade stenosis. PLAN: Findings reviewed with patient. Brief consultation regarding this with her primary referring neurologist. Given the patient's previous history of idiopathic intracranial hypertension, and the neuro imaging findings and the LP findings, option of endovascular revascularization of the slightly more dominant right transverse sinus/sigmoid sinus junction was discussed. It is probable that the patient's symptoms are related to increased venous hypertension related to the bilateral transverse sinus/sigmoid sinus severe stenosis. By improving of the outflow, it would likely decrease increased venous hypertension, and probably provide relief of symptoms related to the increased venous pressure. This was also  decrease the chance of retrograde venous hemorrhages, and venous strokes, and also decrease the threat of blindness. Briefly the endovascular procedure was reviewed with patient. The low risks of intracranial hemorrhage, subarachnoid hemorrhage, and subdural hemorrhage, and remotely of death were reviewed. Patient has expressed her desire to proceed with the endovascular treatment as described above of the right transverse sinus/sigmoid sinus severe stenosis. The endovascular treatment will be scheduled in the very near future. Should the patient's symptoms worsen, such as blurred vision, double vision and blindness, the patient was asked to call [phone]. Electronically Signed   By: Julieanne Cotton M.D.   On: 04/02/2020 14:45   IR ANGIO VERTEBRAL SEL VERTEBRAL BILAT MOD SED  Result Date: 04/03/2020 Narrative & Impression CLINICAL DATA:  History of worsening headaches, blurred vision and right-sided pulsatile tinnitus. History of idiopathic intracranial hypertension. Abnormal MRV of the brain.  EXAM: BILATERAL COMMON CAROTID AND INNOMINATE ANGIOGRAPHY  COMPARISON:  MRV of the brain of February 15, 2020.  MEDICATIONS: Heparin 1000 units IV. No antibiotic was administered within 1 hour of the procedure.  ANESTHESIA/SEDATION: Versed 1.5 mg IV; Fentanyl a 37.5 mcg IV  Moderate Sedation Time:  57 minutes  The patient was continuously monitored during the procedure by the interventional radiology nurse under my direct supervision.  CONTRAST:  Isovue-300 approximately 70 mL  FLUOROSCOPY TIME:  Fluoroscopy Time: 8 minutes 6 seconds (1319 mGy).  COMPLICATIONS: None immediate.  TECHNIQUE: Informed written consent was obtained from the patient after a thorough discussion of the procedural risks, benefits and alternatives. All questions were addressed. Maximal Sterile Barrier Technique was utilized including caps, mask, sterile gowns, sterile gloves, sterile drape, hand hygiene and skin antiseptic. A timeout was  performed prior to the initiation of the procedure.  The right groin was prepped and draped in the usual sterile fashion. Using ultrasound guidance, access into the right common femoral artery was obtained using the modified Seldinger technique with a micropuncture set. Over a 0.035 inch guidewire, a 5 French Pinnacle sheath was inserted. Through this, and also over 0.035 inch guidewire, a 5 [place] JB 1 catheter was advanced to the aortic arch region and selectively positioned in the right common carotid artery, the right vertebral artery, the left common carotid artery and the left vertebral artery.  FINDINGS: The innominate arteriogram demonstrates the origins of the right subclavian artery and the right common carotid artery to be widely patent.  The right common carotid arteriogram demonstrates the right external carotid artery and its major branches to be widely patent.  The right internal carotid artery at the bulb to the cranial skull base is also widely patent.  The petrous, cavernous and supraclinoid segments  are widely patent.  The right middle cerebral artery and the right anterior cerebral artery opacify into the capillary and venous phases.  The venous phase demonstrates predominant drainage via the right transverse sinus, the right sigmoid sinus and the right internal jugular vein.  Severe decrease in the caliber of the right transverse sinus in the mid to distal segment and proximal sigmoid sinus is noted.  The right vertebral artery origin is widely patent.  The vessel opacifies to the cranial skull base. Patency is maintained of the right vertebrobasilar junction and the right posterior-inferior cerebellar artery.  The basilar artery, the posterior cerebral arteries, the superior cerebellar arteries and the anterior-inferior cerebellar arteries opacify into the capillary and venous phases. Unopacified blood is seen in the basilar artery from the contralateral vertebral artery.  The  venous phase demonstrates severe decrease in the caliber of the distal right transverse sinus extending into the proximal sigmoid sinus.  Similar finding is evident of the distal left transverse sinus with the decrease in caliber extending into the proximal left sigmoid sinus.  Left common carotid arteriogram demonstrates the left external carotid artery and its major branches to be widely patent. The left internal carotid artery at the bulb to the cranial skull base is widely patent.  The petrous, the cavernous and the supraclinoid segments are widely patent.  A short segment left posterior communicating artery is seen.  The left middle cerebral artery and the left anterior cerebral artery opacify into the capillary and venous phases. The venous phase again demonstrates a high-grade stenosis of the distal right transverse sinus extending into the proximal sigmoid sinus with similar finding in the left transverse sinus/left sigmoid sinus junction.  The left vertebral origin is widely patent. The vessel opacifies to the cranial skull base. Patency is seen of the left vertebrobasilar junction and the left posterior-inferior cerebellar artery.  The basilar artery, the posterior cerebral arteries, the superior cerebellar arteries and the anterior-inferior cerebellar arteries opacify into the capillary and venous phases.  Again demonstrated is the high-grade decrease in caliber of the transverse sinus/sigmoid sinus junctions bilaterally, with the right being slightly more dominant.  IMPRESSION: Bilateral severe decrease in caliber of the distal right transverse sinus/sigmoid sinus junction, and of the left distal transverse sinus/sigmoid sinus junction suggestive of high-grade stenosis.  PLAN: Findings reviewed with patient.  Brief consultation regarding this with her primary referring neurologist.  Given the patient's previous history of idiopathic intracranial hypertension, and the neuro imaging findings  and the LP findings, option of endovascular revascularization of the slightly more dominant right transverse sinus/sigmoid sinus junction was discussed. It is probable that the patient's symptoms are related to increased venous hypertension related to the bilateral transverse sinus/sigmoid sinus severe stenosis.  By improving of the outflow, it would likely decrease increased venous hypertension, and probably provide relief of symptoms related to the increased venous pressure. This was also decrease the chance of retrograde venous hemorrhages, and venous strokes, and also decrease the threat of blindness.  Briefly the endovascular procedure was reviewed with patient. The low risks of intracranial hemorrhage, subarachnoid hemorrhage, and subdural hemorrhage, and remotely of death were reviewed.  Patient has expressed her desire to proceed with the endovascular treatment as described above of the right transverse sinus/sigmoid sinus severe stenosis.  The endovascular treatment will be scheduled in the very near future. Should the patient's symptoms worsen, such as blurred vision, double vision and blindness, the patient was asked to call [phone].   Electronically Signed  By: Luanne Bras M.D.   On: 04/02/2020 14:45    Labs:  CBC: Recent Labs    02/15/20 1313 04/02/20 0930 04/02/20 1055 05/01/20 0642  WBC 10.9* 7.5  --  8.9  HGB 14.4 13.6 14.3 12.2  HCT 44.0 41.3 42.0 38.6  PLT 310 283  --  258    COAGS: Recent Labs    04/02/20 1050 05/01/20 0642  INR 1.0 1.1  APTT  --  30    BMP: Recent Labs    02/15/20 1313 04/02/20 1055 05/01/20 0642  NA 138 143 139  K 3.4* 3.7 3.8  CL 101 107 110  CO2 26  --  20*  GLUCOSE 103* 102* 121*  BUN 12 9 13   CALCIUM 9.3  --  9.1  CREATININE 0.78 1.00 0.98  GFRNONAA >60  --  >60    LIVER FUNCTION TESTS: No results for input(s): BILITOT, AST, ALT, ALKPHOS, PROT, ALBUMIN in the last 8760 hours.  TUMOR MARKERS: No results for input(s):  AFPTM, CEA, CA199, CHROMGRNA in the last 8760 hours.  Assessment and Plan:  Bilateral transverse sinus/sigmoid sinus severe stenosis; headaches and blurred vision: [name], 35 year old female, presents today to the [facility] Neuro Interventional Radiology department for an image-guided cerebral angiogram with revascularization/angioplasty/stent of right transverse sinus/sigmoid sinus junction stenosis. This procedure will be done under general anesthesia and she will stay for overnight observation.  Risks and benefits of this procedure were discussed with the patient including, but not limited to bleeding, infection, vascular injury or contrast induced renal failure.  This interventional procedure involves the use of X-rays and because of the nature of the planned procedure, it is possible that we will have prolonged use of X-ray fluoroscopy.  Potential radiation risks to you include (but are not limited to) the following: - A slightly elevated risk for cancer  several years later in life. This risk is typically less than 0.5% percent. This risk is low in comparison to the normal incidence of human cancer, which is 33% for women and 50% for men according to the [place]. - Radiation induced injury can include skin redness, resembling a rash, tissue breakdown / ulcers and hair loss (which can be temporary or permanent).   The likelihood of either of these occurring depends on the difficulty of the procedure and whether you are sensitive to radiation due to previous procedures, disease, or genetic conditions.   IF your procedure requires a prolonged use of radiation, you will be notified and given written instructions for further action.  It is your responsibility to monitor the irradiated area for the 2 weeks following the procedure and to notify your physician if you are concerned that you have suffered a radiation induced injury.    All of the patient's questions were  answered, patient is agreeable to proceed. She has been NPO. Labs and vitals have been reviewed. She has taken plavix and aspirin as instructed.  Consent signed and in chart.  Thank you for this interesting consult.  I greatly enjoyed meeting Brenda Molina and look forward to participating in their care.  A copy of this report was sent to the requesting provider on this date.  Electronically Signed: Soyla Dryer, AGACNP-BC [phone] 05/01/2020, 8:34 AM   I spent a total of 20 Minutes    in face to face in clinical consultation, greater than 50% of which was counseling/coordinating care for image-guided cerebral angiogram with revascularization/angioplasty/stent of right transverse sinus/sigmoid sinus junction  stenosis.

## 2020-05-01 ENCOUNTER — Encounter (HOSPITAL_COMMUNITY): Payer: Self-pay | Admitting: Interventional Radiology

## 2020-05-01 ENCOUNTER — Ambulatory Visit (HOSPITAL_COMMUNITY)
Admission: RE | Admit: 2020-05-01 | Discharge: 2020-05-01 | Disposition: A | Discharge status: Home / Self Care | Payer: No Typology Code available for payment source | Attending: Interventional Radiology | Admitting: Interventional Radiology

## 2020-05-01 ENCOUNTER — Ambulatory Visit (HOSPITAL_COMMUNITY): Payer: No Typology Code available for payment source | Admitting: Certified Registered"

## 2020-05-01 ENCOUNTER — Encounter (HOSPITAL_COMMUNITY)
Admission: RE | Disposition: A | Discharge status: Home / Self Care | Payer: Self-pay | Source: Home / Self Care | Attending: Interventional Radiology

## 2020-05-01 ENCOUNTER — Encounter (HOSPITAL_COMMUNITY): Payer: Self-pay

## 2020-05-01 ENCOUNTER — Ambulatory Visit (HOSPITAL_COMMUNITY)
Admission: RE | Admit: 2020-05-01 | Discharge: 2020-05-01 | Disposition: A | Discharge status: Home / Self Care | Payer: No Typology Code available for payment source | Source: Ambulatory Visit | Attending: Interventional Radiology | Admitting: Interventional Radiology

## 2020-05-01 ENCOUNTER — Other Ambulatory Visit: Payer: Self-pay

## 2020-05-01 DIAGNOSIS — L02411 Cutaneous abscess of right axilla: Secondary | ICD-10-CM | POA: Diagnosis not present

## 2020-05-01 DIAGNOSIS — Z793 Long term (current) use of hormonal contraceptives: Secondary | ICD-10-CM | POA: Insufficient documentation

## 2020-05-01 DIAGNOSIS — G932 Benign intracranial hypertension: Secondary | ICD-10-CM | POA: Diagnosis not present

## 2020-05-01 DIAGNOSIS — Z7902 Long term (current) use of antithrombotics/antiplatelets: Secondary | ICD-10-CM | POA: Diagnosis not present

## 2020-05-01 DIAGNOSIS — Z538 Procedure and treatment not carried out for other reasons: Secondary | ICD-10-CM | POA: Insufficient documentation

## 2020-05-01 DIAGNOSIS — Z01818 Encounter for other preprocedural examination: Secondary | ICD-10-CM | POA: Insufficient documentation

## 2020-05-01 DIAGNOSIS — Z79899 Other long term (current) drug therapy: Secondary | ICD-10-CM | POA: Diagnosis not present

## 2020-05-01 DIAGNOSIS — Z8249 Family history of ischemic heart disease and other diseases of the circulatory system: Secondary | ICD-10-CM | POA: Diagnosis not present

## 2020-05-01 DIAGNOSIS — I676 Nonpyogenic thrombosis of intracranial venous system: Secondary | ICD-10-CM | POA: Diagnosis not present

## 2020-05-01 HISTORY — DX: Sleep apnea, unspecified: G47.30

## 2020-05-01 HISTORY — PX: RADIOLOGY WITH ANESTHESIA: SHX6223

## 2020-05-01 HISTORY — DX: Gastro-esophageal reflux disease without esophagitis: K21.9

## 2020-05-01 LAB — CBC WITH DIFFERENTIAL/PLATELET
Abs Immature Granulocytes: 0.04 10*3/uL (ref 0.00–0.07)
Basophils Absolute: 0 10*3/uL (ref 0.0–0.1)
Basophils Relative: 0 %
Eosinophils Absolute: 0.3 10*3/uL (ref 0.0–0.5)
Eosinophils Relative: 4 %
HCT: 38.6 % (ref 36.0–46.0)
Hemoglobin: 12.2 g/dL (ref 12.0–15.0)
Immature Granulocytes: 1 %
Lymphocytes Relative: 20 %
Lymphs Abs: 1.7 10*3/uL (ref 0.7–4.0)
MCH: 24.4 pg — ABNORMAL LOW (ref 26.0–34.0)
MCHC: 31.6 g/dL (ref 30.0–36.0)
MCV: 77.2 fL — ABNORMAL LOW (ref 80.0–100.0)
Monocytes Absolute: 0.8 10*3/uL (ref 0.1–1.0)
Monocytes Relative: 9 %
Neutro Abs: 6 10*3/uL (ref 1.7–7.7)
Neutrophils Relative %: 66 %
Platelets: 258 10*3/uL (ref 150–400)
RBC: 5 MIL/uL (ref 3.87–5.11)
RDW: 15.2 % (ref 11.5–15.5)
WBC: 8.9 10*3/uL (ref 4.0–10.5)
nRBC: 0 % (ref 0.0–0.2)

## 2020-05-01 LAB — BASIC METABOLIC PANEL
Anion gap: 9 (ref 5–15)
BUN: 13 mg/dL (ref 6–20)
CO2: 20 mmol/L — ABNORMAL LOW (ref 22–32)
Calcium: 9.1 mg/dL (ref 8.9–10.3)
Chloride: 110 mmol/L (ref 98–111)
Creatinine, Ser: 0.98 mg/dL (ref 0.44–1.00)
GFR, Estimated: >60 mL/min (ref >60)
Glucose, Bld: 121 mg/dL — ABNORMAL HIGH (ref 70–99)
Potassium: 3.8 mmol/L (ref 3.5–5.1)
Sodium: 139 mmol/L (ref 135–145)

## 2020-05-01 LAB — POCT PREGNANCY, URINE: Preg Test, Ur: NEGATIVE

## 2020-05-01 LAB — PLATELET INHIBITION P2Y12: Platelet Function  P2Y12: 98 [PRU] — ABNORMAL LOW (ref 182–335)

## 2020-05-01 LAB — PROTIME-INR
INR: 1.1 (ref 0.8–1.2)
Prothrombin Time: 13.4 seconds (ref 11.4–15.2)

## 2020-05-01 LAB — APTT: aPTT: 30 seconds (ref 24–36)

## 2020-05-01 IMAGING — XA IR TRANSCATH PLC STENT  INITIAL VEIN  INC ANGIOPLASTY
2 series · 10 of 24 positions shown · IV contrast (IODINE)
Comparison: Diagnostic catheter arteriogram [DATE],
and MRV [DATE].

CLINICAL DATA: History of severe headaches, papilledema on MRI scan
of the orbits and debilitating right-sided pulsatile tinnitus.
Symptoms persistent despite being on Diamox.

Angiographically noted to have bilateral transverse sinus/sigmoid
sinus stenosis with the right side being dominant.
EXAM:
IR TRANSCATH [facility]JABU STENT INITIAL VEIN  INC ANGIOPLASTY
TECHNIQUE: Informed written consent was obtained from the patient after a
thorough discussion of the procedural risks, benefits and
alternatives. All questions were addressed. Maximal Sterile Barrier
Technique was utilized including caps, mask, sterile gowns, sterile
gloves, sterile drape, hand hygiene and skin antiseptic. A timeout
was performed prior to the initiation of the procedure.

[Series 13: <mpr range> · axial · 5.0mm · 0.37mm/px · 1 of 35 slices shown]
[im 18/35]
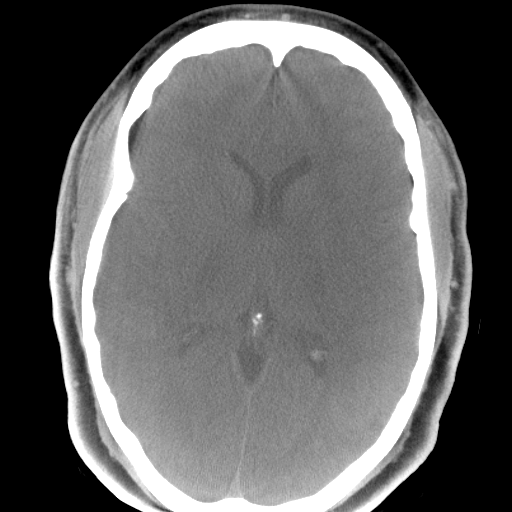

[Series 300: dr. (person_name) · 9 of 246 slices shown]
[im 1/246]
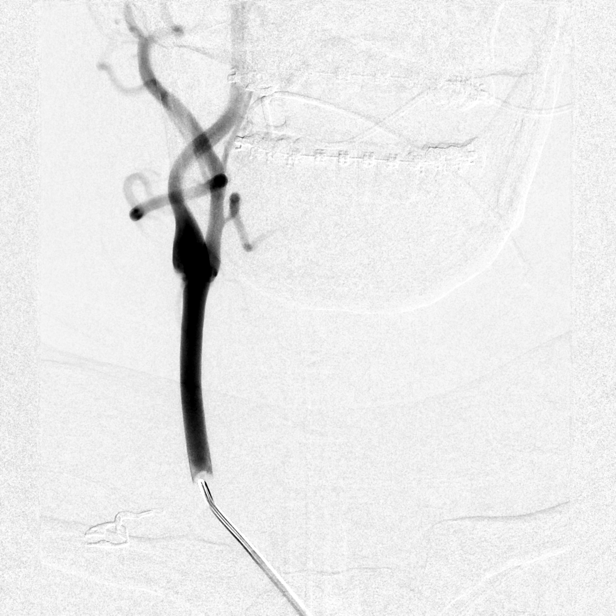
[im 37/246]
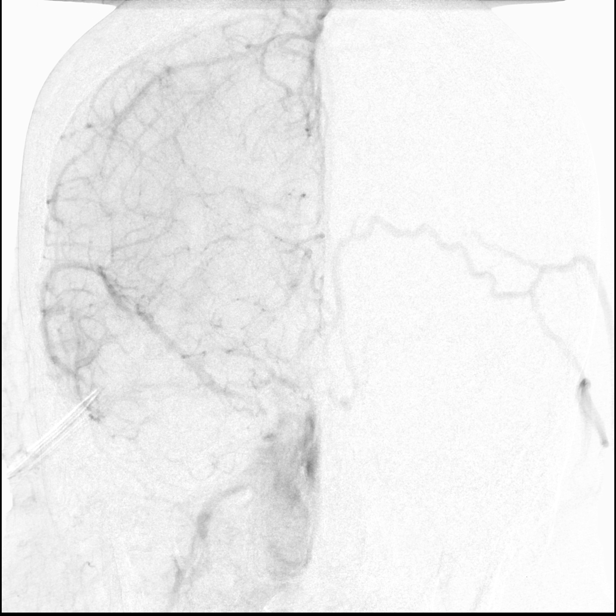
[im 62/246]
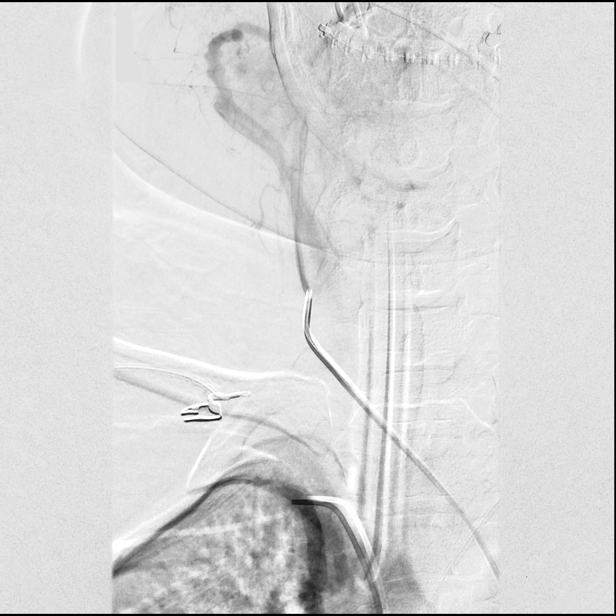
[im 86/246]
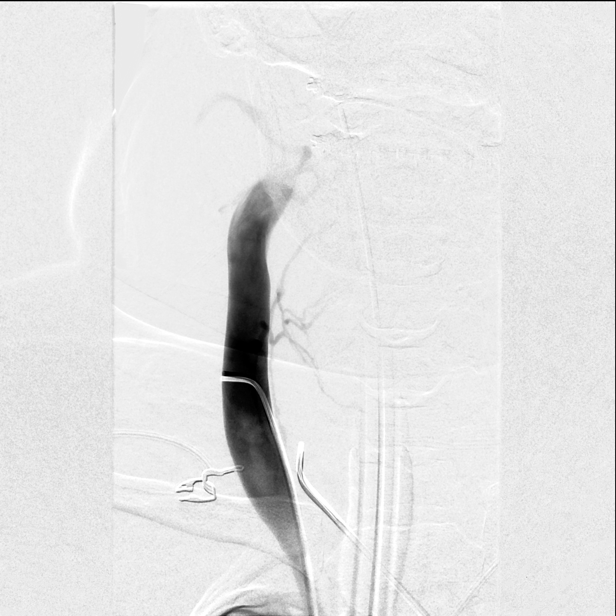
[im 123/246]
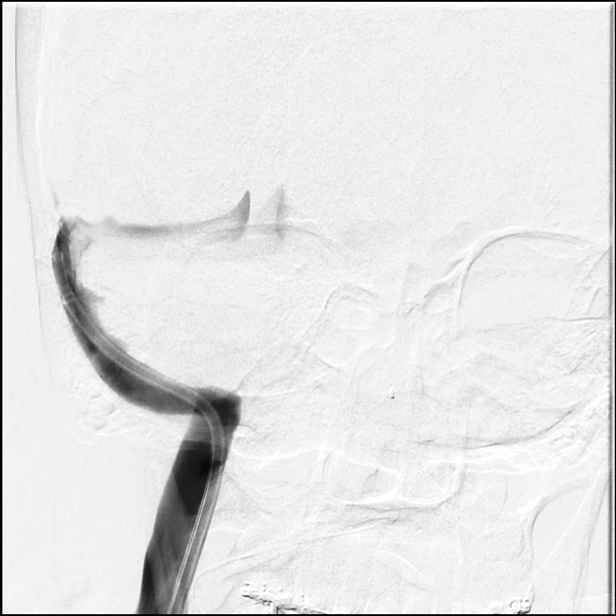
[im 148/246]
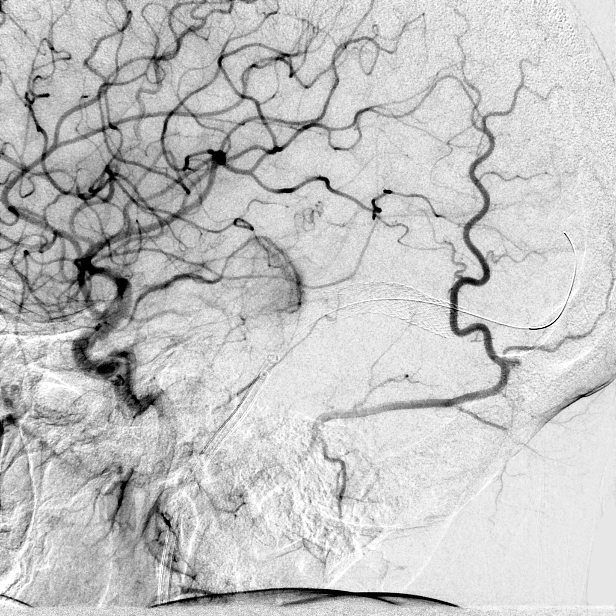
[im 184/246]
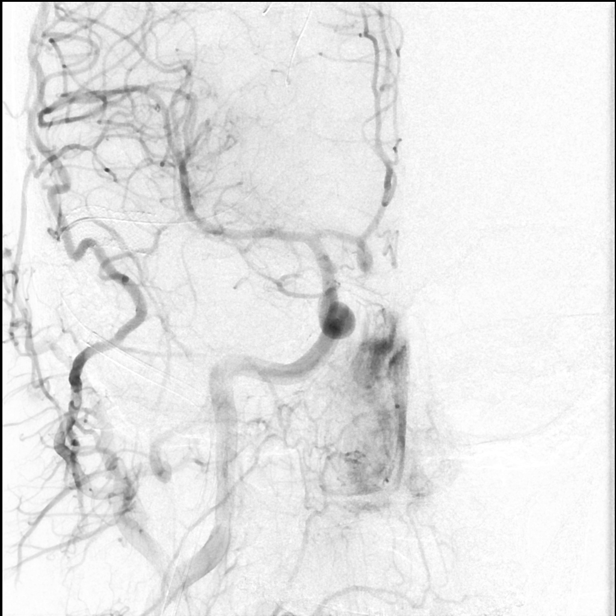
[im 209/246]
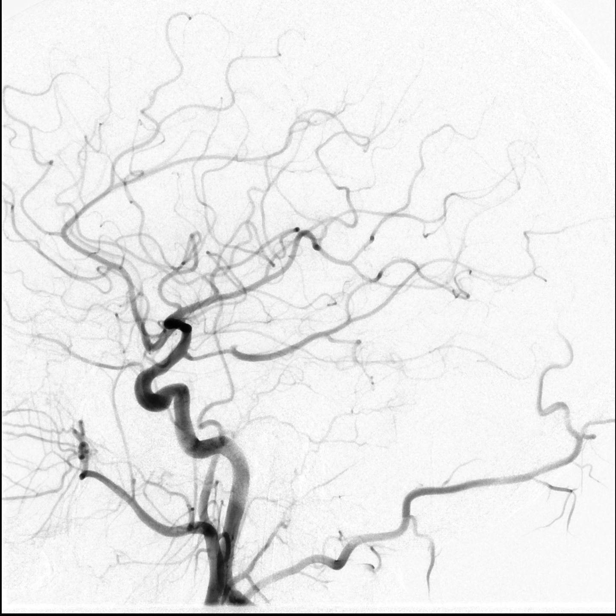
[im 233/246]
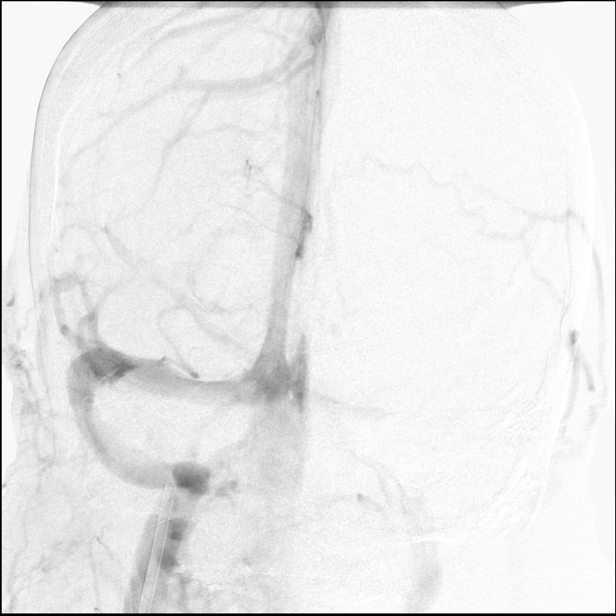

[10 of 24 positions shown; findings below may reference images not displayed]

MEDICATIONS:
Heparin 4,000 units IV. Ancef 2 g IV antibiotic was administered
within 1 hour of the procedure.

ANESTHESIA/SEDATION:
General anesthesia

CONTRAST:  Isovue 300 approximately 60 mL

FLUOROSCOPY TIME:  Fluoroscopy Time: 33 minutes 12 seconds ([8E]
mGy).

COMPLICATIONS:
None immediate.
The right groin was prepped and draped in the usual sterile fashion.
Thereafter using modified Seldinger technique, transfemoral access
into the right common femoral vein was obtained without difficulty.
Over a 0.035 inch guidewire, a 5 French Pinnacle sheath was
inserted. Through this, and also over 0.035 inch guidewire, a 5
French JB 1 catheter was advanced into the inferior cava and
distally into the right internal jugular vein to the cranial skull
base.

Through the prepped the left groin, using micropuncture set, access
into the left common femoral artery was obtained with a
micropuncture set. Over a 0.035 inch guidewire, a 5 French JB 1
catheter was advanced to the aortic arch region, and selectively
positioned in the right common carotid artery. The guidewire was
removed. An arteriogram was then perfor[REDACTED]ed extra cranially
and intracranially. This was then connected to continuous
heparinized saline infusion.
FINDINGS: The right common carotid arteriogram demonstrates the right external
carotid artery and its major branches to be widely patent.

The right internal carotid artery at the bulb to the cranial skull
base is widely patent.

The petrous, the cavernous and the supraclinoid segments are widely
patent.

The right posterior communicating artery is seen opacifying the
right posterior cerebral distribution.

The right middle cerebral artery and the right anterior cerebral
artery opacify into the capillary and venous phases. Venous phase
again demonstrates the bilateral transverse sinus/sigmoid sinus
severe stenosis worse on the dominant right side. No evidence of
retrograde venous reflux is seen.

ENDOVASCULAR PLACEMENT OF STENT AT THE SITE OF THE SEVERE RIGHT
TRANSVERSE SINUS SIGMOID SINUS STENOSIS

The JB 1 diagnostic catheter in the distal right internal jugular
vein was then exchanged over a 0.035 inch 300 cm Rosen exchange
guidewire for an 8 French 95 cm Neuron Max sheath. The guidewire was
removed. Good aspiration obtained from the hub of the Neuron Max
sheath. A gentle control arteriogram performed through this
demonstrated brisk reflux into this segment and transiently the
right transverse sinus. This was then connected to continuous
heparinized saline infusion.

Over a 0.035 inch Roadrunner guidewire, a 105 cm Benchmark 6 French
guide catheter was then advanced without difficulty into the
proximal right sigmoid sinus, and into the proximal sigmoid sinus.
The 035 inch guidewire was easily advanced into the proximal
superior sagittal sinus. However, advancement of the 6 French
Benchmark catheter was met with significant resistance.

The 035 inch guidewire was removed. A 120 cm 4 French angle glide
catheter was then introduced into the Benchmark guide catheter and
over an 014 inch 300 cm BMW exchange guidewire, the 2 were advanced
without difficulty into the proximal superior sagittal sinus. The
guidewire was advanced into the proximal superior sagittal sinus,
and the 4 French angle glide catheter was removed. Good aspiration
obtained from the hub of the Benchmark catheter which was now in the
superior sagittal sinus right transverse sinus junction.

Measurements were then performed of the right transverse sinus and
distally proximally and also the proximal sigmoid sinus.

It was elected to proceed with placement of a 8 mm x 40 mm Precise
stent. This was prepped and purged with the heparinized saline
infusion. In a coaxial manner and with constant heparinized saline
infusion using the rapid exchange technique, the stent delivery
system was advanced without difficulty and positioned such that the
distal and proximal markers were almost adequate distant on the
lateral images.

The stent was then deployed in the usual manner slowly and
deliberately. The delivery apparatus was removed. Control
arteriogram performed through the 5 French diagnostic catheter in
the right common carotid artery into the venous phase demonstrated
excellent apposition and also significantly improved caliber of the
right transverse sinus sigmoid sinus junction.

Control arteriograms were then performed into the venous phase at 15
and 30 minutes post deployment of the stent. These continued to
demonstrate no evidence of a waist within the stented segment. Also
no evidence of intraluminal filling defects was seen in the right
transverse sinus sigmoid sinus and internal jugular vein.

The exchange micro guidewire, and the Benchmark catheter were
removed. A control arteriogram performed through the 5 French
diagnostic catheter into the venous phase again demonstrated
complete revascularization of the right transverse sinus with brisk
flow in the antegrade manner. Opacification was also noted into the
left transverse sinus and sigmoid sinus junctions.

Distally patency of the 3 converging veins into the vein of Labbe
remain patent without evidence of stagnation.

Throughout the procedure, the patient's blood pressure and
neurological status remained stable.

The 8 French a Neuron Max sheath was then removed and manual
compression held with hemostasis in the right groin.

Final common carotid arteriogram continued to demonstrate no
evidence of intraluminal filling defects or of occlusions.

This was then removed. The 5 French Pinnacle sheath in the left
groin was then removed with hemostasis achieved with a 5 French
ExoSeal closure device. Distal pulses remained present unchanged.

A CT of the brain demonstrated no evidence of intracranial
hemorrhage, mass effect or midline shift.

The patient was then extubated without difficulty. Upon recovery,
the patient denied any headaches, nausea, vomiting or visual
symptoms.

Patient was able to move all 4 extremities equally.

She was then returned to the PACU and then neuro ICU on low-dose IV
heparin and close monitoring of neurological function and blood
pressure.

Overnight, the patient remained stable with only a minor headache
behind the right eye which responded to Tylenol.

The following morning, the groin sites appeared soft. Distal pulses
were present. Neurologically the patient complained of no
neurological symptoms or signs.

She reported no obvious presence of a right-sided pulsatile
tinnitus. She reported no visual blurring or diplopia.

Her IV heparin was stopped and the patient was switched to aspirin
325 mg a day, and Plavix 75 mg a day.

Patient was then ambulated. She was then discharged home under the
care of her family. She was advised to maintain adequate hydration
and to take oral medications including Diamox, and also the
antiplatelets. Patient was advised not to stoop, bend or lift
weights above 10 pounds for 2 weeks and not to drive.

Patient expressed understanding, and agreement with the above
management plan.
IMPRESSION: Status post endovascular revascularization of symptomatic severe
stenosis of the right transverse sinus sigmoid sinus junction with
placement of endovascular stent.

PLAN:
Follow-up in clinic 2 weeks post discharge.

## 2020-05-01 SURGERY — IR WITH ANESTHESIA
Anesthesia: General

## 2020-05-01 MED ORDER — NIMODIPINE 6 MG/ML PO SOLN
60.0000 mg | Freq: Once | ORAL | Status: AC
  Administered 2020-05-01: 60 mg via ORAL
  Filled 2020-05-01: qty 10

## 2020-05-01 MED ORDER — LACTATED RINGERS IV SOLN: INTRAVENOUS | Status: DC

## 2020-05-01 MED ORDER — ASPIRIN EC 325 MG PO TBEC
325.0000 mg | DELAYED_RELEASE_TABLET | ORAL | Status: AC
  Administered 2020-05-01: 325 mg via ORAL
  Filled 2020-05-01: qty 1

## 2020-05-01 MED ORDER — CHLORHEXIDINE GLUCONATE 0.12 % MT SOLN
OROMUCOSAL | Status: AC
  Administered 2020-05-01: 15 mL via OROMUCOSAL
  Filled 2020-05-01: qty 15

## 2020-05-01 MED ORDER — ORAL CARE MOUTH RINSE: 15.0000 mL | Freq: Once | OROMUCOSAL | Status: AC

## 2020-05-01 MED ORDER — NIMODIPINE 30 MG PO CAPS
0.0000 mg | ORAL_CAPSULE | ORAL | Status: DC
  Filled 2020-05-01: qty 2

## 2020-05-01 MED ORDER — CLOPIDOGREL BISULFATE 75 MG PO TABS
75.0000 mg | ORAL_TABLET | ORAL | Status: AC
  Administered 2020-05-01: 75 mg via ORAL
  Filled 2020-05-01: qty 1

## 2020-05-01 MED ORDER — CEFAZOLIN SODIUM-DEXTROSE 2-4 GM/100ML-% IV SOLN
2.0000 g | INTRAVENOUS | Status: DC
  Filled 2020-05-01: qty 100

## 2020-05-01 MED ORDER — NITROGLYCERIN 1 MG/10 ML FOR IR/CATH LAB
INTRA_ARTERIAL | Status: AC
  Filled 2020-05-01: qty 10

## 2020-05-01 MED ORDER — SODIUM CHLORIDE 0.9 % IV SOLN: INTRAVENOUS | Status: DC

## 2020-05-01 MED ORDER — CHLORHEXIDINE GLUCONATE 0.12 % MT SOLN: 15.0000 mL | Freq: Once | OROMUCOSAL | Status: AC

## 2020-05-01 NOTE — Anesthesia Preprocedure Evaluation (Addendum)
Anesthesia Evaluation  Patient identified by MRN, date of birth, ID band Patient awake    Reviewed: Allergy & Precautions, NPO status , Patient's Chart, lab work & pertinent test results  Airway Mallampati: II  TM Distance: >3 FB Neck ROM: Full    Dental  (+) Teeth Intact, Dental Advisory Given Braces present:   Pulmonary    breath sounds clear to auscultation       Cardiovascular  Rhythm:Regular Rate:Normal     Neuro/Psych Anxiety Depression    GI/Hepatic   Endo/Other    Renal/GU      Musculoskeletal   Abdominal   Peds  Hematology   Anesthesia Other Findings   Reproductive/Obstetrics                            Anesthesia Physical Anesthesia Plan  ASA: III  Anesthesia Plan: General   Post-op Pain Management:    Induction: Intravenous  PONV Risk Score and Plan: Ondansetron and Dexamethasone  Airway Management Planned: Oral ETT  Additional Equipment: Arterial line  Intra-op Plan:   Post-operative Plan: Extubation in OR  Informed Consent: I have reviewed the patients History and Physical, chart, labs and discussed the procedure including the risks, benefits and alternatives for the proposed anesthesia with the patient or authorized representative who has indicated his/her understanding and acceptance.     Dental advisory given  Plan Discussed with: CRNA and Anesthesiologist  Anesthesia Plan Comments:         Anesthesia Quick Evaluation

## 2020-05-01 NOTE — Progress Notes (Signed)
Patient has boil on right axilla.  Boil is open and oozing.  Dr. Corliss Skains notified.  Surgery cancelled per Dr. Corliss Skains.  Patient to go to urgent care for evaluation and treatment.  Patient to be rescheduled within the few weeks.  Patient needs to be on antibiotic for 5-10 days before having procedure.

## 2020-05-01 NOTE — Progress Notes (Signed)
Per Achilles Dunk, CRNA, OK to give ASA, Plavix and Nimodipine.  Patient unable to tolerate size of capsule, per Ruthy Dick ok to give oral solution.  Received call from [place], [place] with IR to hold Nimodipine.  Patient already received medication before we could hold.

## 2020-05-01 NOTE — Anesthesia Procedure Notes (Signed)
Arterial Line Insertion Start/End1/08/2020 8:05 AM, 05/01/2020 8:15 AM Performed by: Shireen Quan, CRNA, CRNA  Patient location: Pre-op. Preanesthetic checklist: patient identified, IV checked, risks and benefits discussed, surgical consent, monitors and equipment checked, pre-op evaluation, timeout performed and anesthesia consent Lidocaine 1% used for infiltration Left, radial was placed Catheter size: 20 G Hand hygiene performed , maximum sterile barriers used  and Seldinger technique used Allen's test indicative of satisfactory collateral circulation Attempts: 1 Procedure performed without using ultrasound guided technique. Following insertion, dressing applied and Biopatch. Post procedure assessment: normal  Patient tolerated the procedure well with no immediate complications.

## 2020-05-01 NOTE — Progress Notes (Signed)
NIR.  Patient was scheduled for an image-guided cerebral arteriogram with possible revascularization (angioplasty, stent placement) of right TS/SS junction stenosis with Dr. Corliss Skains.  Patient awake and alert sitting in bed. States that her symptoms (blurred vision, dizziness, LLE numbness) are stable at this time. Is complaining of "boil" of right axilla. States that she "poped it" and pus came out. States she was told to "let it air out".  On PE, right axilla with approximate 3-4 cm of induration with central opening with pus excretion from opening- unsure how far this tracks down into axilla. Patient states this is not painful. She is afebrile at this time.  Dr. Corliss Skains at bedside. Discussed possible right axilla boil and risk of infection (possible spread of infection to stent). At this time, Dr. Corliss Skains recommends postponing procedure until course of oral antibiotic for boil/possible abscess is complete. Procedure postponed at this time- NIR schedulers to call patient to reschedule procedure (tentatively for next week pending completion of antibiotics). Patient states that she will see her PCP or go to urgent care today for this.  All questions answered and concerns addressed. Patient conveys understanding and agrees with plan.   Brenda Boga Nafisah Runions, PA-C 05/01/2020, 9:55 AM

## 2020-05-02 ENCOUNTER — Encounter (HOSPITAL_COMMUNITY): Payer: Self-pay | Admitting: Interventional Radiology

## 2020-05-06 ENCOUNTER — Other Ambulatory Visit (HOSPITAL_COMMUNITY)
Admission: RE | Admit: 2020-05-06 | Discharge: 2020-05-06 | Disposition: A | Discharge status: Home / Self Care | Payer: No Typology Code available for payment source | Source: Ambulatory Visit | Attending: Interventional Radiology | Admitting: Interventional Radiology

## 2020-05-06 ENCOUNTER — Other Ambulatory Visit (HOSPITAL_COMMUNITY): Payer: Self-pay | Admitting: Radiology

## 2020-05-06 ENCOUNTER — Encounter (HOSPITAL_COMMUNITY): Payer: Self-pay

## 2020-05-06 DIAGNOSIS — Z01812 Encounter for preprocedural laboratory examination: Secondary | ICD-10-CM | POA: Insufficient documentation

## 2020-05-06 DIAGNOSIS — G932 Benign intracranial hypertension: Secondary | ICD-10-CM

## 2020-05-06 DIAGNOSIS — Z20822 Contact with and (suspected) exposure to covid-19: Secondary | ICD-10-CM | POA: Insufficient documentation

## 2020-05-06 DIAGNOSIS — R69 Illness, unspecified: Secondary | ICD-10-CM | POA: Diagnosis not present

## 2020-05-06 LAB — PLATELET INHIBITION P2Y12: Platelet Function  P2Y12: 72 [PRU] — ABNORMAL LOW (ref 182–335)

## 2020-05-06 LAB — SARS CORONAVIRUS 2 (TAT 6-24 HRS): SARS Coronavirus 2: NEGATIVE

## 2020-05-07 ENCOUNTER — Other Ambulatory Visit: Payer: Self-pay | Admitting: Student

## 2020-05-07 ENCOUNTER — Encounter (HOSPITAL_COMMUNITY): Payer: Self-pay | Admitting: Interventional Radiology

## 2020-05-07 NOTE — Anesthesia Preprocedure Evaluation (Addendum)
Anesthesia Evaluation  Patient identified by MRN, date of birth, ID band Patient awake    Reviewed: Allergy & Precautions, NPO status , Patient's Chart, lab work & pertinent test results  Airway Mallampati: III  TM Distance: >3 FB Neck ROM: Full    Dental   Braces:   Pulmonary sleep apnea ,    Pulmonary exam normal breath sounds clear to auscultation       Cardiovascular hypertension, Normal cardiovascular exam Rhythm:Regular Rate:Normal     Neuro/Psych PSYCHIATRIC DISORDERS Anxiety Depression negative neurological ROS     GI/Hepatic Neg liver ROS,   Endo/Other  Morbid obesityPCOS (polycystic ovarian syndrome)  Renal/GU negative Renal ROS     Musculoskeletal negative musculoskeletal ROS (+)   Abdominal (+) + obese,   Peds  Hematology negative hematology ROS (+)   Anesthesia Other Findings STENOSIS  Reproductive/Obstetrics hcg negative                           Anesthesia Physical Anesthesia Plan  ASA: III  Anesthesia Plan: General   Post-op Pain Management:    Induction: Intravenous  PONV Risk Score and Plan: 3 and Ondansetron, Dexamethasone, Midazolam and Treatment may vary due to age or medical condition  Airway Management Planned: Oral ETT  Additional Equipment: Arterial line  Intra-op Plan:   Post-operative Plan: Extubation in OR  Informed Consent: I have reviewed the patients History and Physical, chart, labs and discussed the procedure including the risks, benefits and alternatives for the proposed anesthesia with the patient or authorized representative who has indicated his/her understanding and acceptance.     Dental advisory given  Plan Discussed with: CRNA  Anesthesia Plan Comments:        Anesthesia Quick Evaluation

## 2020-05-07 NOTE — Progress Notes (Addendum)
Brenda Molina denies chest pain or shortness of breath. Patient tested negative for Covid 05/06/20 and has been in quarantine since that time.  Brenda Molina said that she thought she was spending one night, patient also reports that she does not  Have anyone to stay with her at home.  I sent a staff message to Alwyn Ren, NP asking if patient is going to be discharged.  I called  Interventional Radiology and asked what PA-C I should call, I was given Joan Flores, PA-C pager.  Joan Flores called me back and said that Brenda Molina will stay over night, the order will be placed in am.  I called and gave information to Brenda Molina.

## 2020-05-08 ENCOUNTER — Ambulatory Visit (HOSPITAL_COMMUNITY): Payer: No Typology Code available for payment source | Admitting: Anesthesiology

## 2020-05-08 ENCOUNTER — Encounter (HOSPITAL_COMMUNITY): Payer: Self-pay | Admitting: Interventional Radiology

## 2020-05-08 ENCOUNTER — Inpatient Hospital Stay (HOSPITAL_COMMUNITY)
Admission: AD | Admit: 2020-05-08 | Discharge: 2020-05-09 | DRG: 027 | Disposition: A | Discharge status: Home / Self Care | Payer: No Typology Code available for payment source | Attending: Interventional Radiology | Admitting: Interventional Radiology

## 2020-05-08 ENCOUNTER — Ambulatory Visit (HOSPITAL_COMMUNITY)
Admission: RE | Admit: 2020-05-08 | Discharge: 2020-05-08 | Disposition: A | Discharge status: Home / Self Care | Payer: No Typology Code available for payment source | Source: Ambulatory Visit | Attending: Interventional Radiology | Admitting: Interventional Radiology

## 2020-05-08 ENCOUNTER — Other Ambulatory Visit: Payer: Self-pay

## 2020-05-08 ENCOUNTER — Encounter (HOSPITAL_COMMUNITY)
Admission: AD | Disposition: A | Discharge status: Home / Self Care | Payer: Self-pay | Source: Home / Self Care | Attending: Interventional Radiology

## 2020-05-08 DIAGNOSIS — Z7902 Long term (current) use of antithrombotics/antiplatelets: Secondary | ICD-10-CM | POA: Insufficient documentation

## 2020-05-08 DIAGNOSIS — J45909 Unspecified asthma, uncomplicated: Secondary | ICD-10-CM | POA: Diagnosis present

## 2020-05-08 DIAGNOSIS — E282 Polycystic ovarian syndrome: Secondary | ICD-10-CM | POA: Diagnosis not present

## 2020-05-08 DIAGNOSIS — I6601 Occlusion and stenosis of right middle cerebral artery: Principal | ICD-10-CM | POA: Diagnosis present

## 2020-05-08 DIAGNOSIS — G4730 Sleep apnea, unspecified: Secondary | ICD-10-CM | POA: Diagnosis not present

## 2020-05-08 DIAGNOSIS — L02421 Furuncle of right axilla: Secondary | ICD-10-CM | POA: Diagnosis present

## 2020-05-08 DIAGNOSIS — Z20822 Contact with and (suspected) exposure to covid-19: Secondary | ICD-10-CM | POA: Diagnosis present

## 2020-05-08 DIAGNOSIS — H93A1 Pulsatile tinnitus, right ear: Secondary | ICD-10-CM | POA: Diagnosis not present

## 2020-05-08 DIAGNOSIS — Z825 Family history of asthma and other chronic lower respiratory diseases: Secondary | ICD-10-CM

## 2020-05-08 DIAGNOSIS — G932 Benign intracranial hypertension: Secondary | ICD-10-CM

## 2020-05-08 DIAGNOSIS — Z7982 Long term (current) use of aspirin: Secondary | ICD-10-CM | POA: Insufficient documentation

## 2020-05-08 DIAGNOSIS — I676 Nonpyogenic thrombosis of intracranial venous system: Secondary | ICD-10-CM | POA: Diagnosis not present

## 2020-05-08 DIAGNOSIS — Z23 Encounter for immunization: Secondary | ICD-10-CM | POA: Diagnosis not present

## 2020-05-08 DIAGNOSIS — Z8249 Family history of ischemic heart disease and other diseases of the circulatory system: Secondary | ICD-10-CM | POA: Diagnosis not present

## 2020-05-08 DIAGNOSIS — Z79899 Other long term (current) drug therapy: Secondary | ICD-10-CM

## 2020-05-08 DIAGNOSIS — F419 Anxiety disorder, unspecified: Secondary | ICD-10-CM | POA: Diagnosis present

## 2020-05-08 DIAGNOSIS — K219 Gastro-esophageal reflux disease without esophagitis: Secondary | ICD-10-CM | POA: Diagnosis present

## 2020-05-08 HISTORY — PX: IR CT HEAD LTD: IMG2386

## 2020-05-08 HISTORY — PX: RADIOLOGY WITH ANESTHESIA: SHX6223

## 2020-05-08 HISTORY — DX: Unspecified asthma, uncomplicated: J45.909

## 2020-05-08 HISTORY — PX: IR TRANSCATH PLC STENT  INITIAL VEIN  INC ANGIOPLASTY: IMG5445

## 2020-05-08 HISTORY — DX: HELLP syndrome (HELLP), unspecified trimester: O14.20

## 2020-05-08 HISTORY — PX: IR ANGIO INTRA EXTRACRAN SEL COM CAROTID INNOMINATE UNI R MOD SED: IMG5359

## 2020-05-08 HISTORY — PX: IR VENO/JUGULAR RIGHT: IMG2275

## 2020-05-08 LAB — CBC WITH DIFFERENTIAL/PLATELET
Abs Immature Granulocytes: 0.03 10*3/uL (ref 0.00–0.07)
Basophils Absolute: 0 10*3/uL (ref 0.0–0.1)
Basophils Relative: 0 %
Eosinophils Absolute: 0.5 10*3/uL (ref 0.0–0.5)
Eosinophils Relative: 6 %
HCT: 40 % (ref 36.0–46.0)
Hemoglobin: 12.8 g/dL (ref 12.0–15.0)
Immature Granulocytes: 0 %
Lymphocytes Relative: 24 %
Lymphs Abs: 1.9 10*3/uL (ref 0.7–4.0)
MCH: 24.4 pg — ABNORMAL LOW (ref 26.0–34.0)
MCHC: 32 g/dL (ref 30.0–36.0)
MCV: 76.3 fL — ABNORMAL LOW (ref 80.0–100.0)
Monocytes Absolute: 0.6 10*3/uL (ref 0.1–1.0)
Monocytes Relative: 8 %
Neutro Abs: 4.8 10*3/uL (ref 1.7–7.7)
Neutrophils Relative %: 62 %
Platelets: 260 10*3/uL (ref 150–400)
RBC: 5.24 MIL/uL — ABNORMAL HIGH (ref 3.87–5.11)
RDW: 15 % (ref 11.5–15.5)
WBC: 7.8 10*3/uL (ref 4.0–10.5)
nRBC: 0 % (ref 0.0–0.2)

## 2020-05-08 LAB — BASIC METABOLIC PANEL
Anion gap: 9 (ref 5–15)
BUN: 12 mg/dL (ref 6–20)
CO2: 22 mmol/L (ref 22–32)
Calcium: 9.1 mg/dL (ref 8.9–10.3)
Chloride: 107 mmol/L (ref 98–111)
Creatinine, Ser: 0.81 mg/dL (ref 0.44–1.00)
GFR, Estimated: >60 mL/min (ref >60)
Glucose, Bld: 124 mg/dL — ABNORMAL HIGH (ref 70–99)
Potassium: 3.3 mmol/L — ABNORMAL LOW (ref 3.5–5.1)
Sodium: 138 mmol/L (ref 135–145)

## 2020-05-08 LAB — MRSA PCR SCREENING: MRSA by PCR: NEGATIVE

## 2020-05-08 LAB — PLATELET INHIBITION P2Y12: Platelet Function  P2Y12: 137 [PRU] — ABNORMAL LOW (ref 182–335)

## 2020-05-08 LAB — POCT ACTIVATED CLOTTING TIME
Activated Clotting Time: 255 seconds
Activated Clotting Time: 267 seconds

## 2020-05-08 LAB — PROTIME-INR
INR: 1.1 (ref 0.8–1.2)
Prothrombin Time: 13.3 seconds (ref 11.4–15.2)

## 2020-05-08 LAB — HEPARIN LEVEL (UNFRACTIONATED): Heparin Unfractionated: 0.23 IU/mL — ABNORMAL LOW (ref 0.30–0.70)

## 2020-05-08 LAB — POCT PREGNANCY, URINE: Preg Test, Ur: NEGATIVE

## 2020-05-08 SURGERY — IR WITH ANESTHESIA
Anesthesia: General

## 2020-05-08 MED ORDER — ASPIRIN 325 MG PO TABS
325.0000 mg | ORAL_TABLET | Freq: Every day | ORAL | Status: DC
  Administered 2020-05-09: 325 mg via ORAL
  Filled 2020-05-08: qty 1

## 2020-05-08 MED ORDER — ACETAMINOPHEN 650 MG RE SUPP: 650.0000 mg | RECTAL | Status: DC | PRN

## 2020-05-08 MED ORDER — OXYCODONE HCL 5 MG PO TABS: 5.0000 mg | ORAL_TABLET | Freq: Once | ORAL | Status: DC | PRN

## 2020-05-08 MED ORDER — ONDANSETRON HCL 4 MG/2ML IJ SOLN
INTRAMUSCULAR | Status: DC | PRN
  Administered 2020-05-08: 4 mg via INTRAVENOUS

## 2020-05-08 MED ORDER — ROCURONIUM BROMIDE 10 MG/ML (PF) SYRINGE
PREFILLED_SYRINGE | INTRAVENOUS | Status: DC | PRN
  Administered 2020-05-08: 60 mg via INTRAVENOUS
  Administered 2020-05-08: 20 mg via INTRAVENOUS

## 2020-05-08 MED ORDER — PROTAMINE SULFATE 10 MG/ML IV SOLN
INTRAVENOUS | Status: DC | PRN
  Administered 2020-05-08: 7.5 mg via INTRAVENOUS

## 2020-05-08 MED ORDER — ACETAMINOPHEN 10 MG/ML IV SOLN
1000.0000 mg | Freq: Once | INTRAVENOUS | Status: DC | PRN
  Administered 2020-05-08: 1000 mg via INTRAVENOUS

## 2020-05-08 MED ORDER — OXYCODONE HCL 5 MG/5ML PO SOLN: 5.0000 mg | Freq: Once | ORAL | Status: DC | PRN

## 2020-05-08 MED ORDER — SODIUM CHLORIDE 0.9 % IV SOLN: INTRAVENOUS | Status: DC

## 2020-05-08 MED ORDER — CLEVIDIPINE BUTYRATE 0.5 MG/ML IV EMUL
0.0000 mg/h | INTRAVENOUS | Status: AC
  Administered 2020-05-08: 2 mg/h via INTRAVENOUS

## 2020-05-08 MED ORDER — CHLORHEXIDINE GLUCONATE 0.12 % MT SOLN
OROMUCOSAL | Status: AC
  Administered 2020-05-08: 15 mL via OROMUCOSAL
  Filled 2020-05-08: qty 15

## 2020-05-08 MED ORDER — CLOPIDOGREL BISULFATE 75 MG PO TABS: 75.0000 mg | ORAL_TABLET | Freq: Every day | ORAL | Status: DC

## 2020-05-08 MED ORDER — FENTANYL CITRATE (PF) 100 MCG/2ML IJ SOLN
25.0000 ug | INTRAMUSCULAR | Status: DC | PRN
  Administered 2020-05-08 (×2): 50 ug via INTRAVENOUS

## 2020-05-08 MED ORDER — FENTANYL CITRATE (PF) 100 MCG/2ML IJ SOLN
INTRAMUSCULAR | Status: DC | PRN
Start: 2020-05-08 — End: 2020-05-08
  Administered 2020-05-08: 100 ug via INTRAVENOUS

## 2020-05-08 MED ORDER — ASPIRIN 81 MG PO CHEW: 324.0000 mg | CHEWABLE_TABLET | Freq: Every day | ORAL | Status: DC

## 2020-05-08 MED ORDER — ACETAMINOPHEN 325 MG PO TABS
650.0000 mg | ORAL_TABLET | ORAL | Status: DC | PRN
  Administered 2020-05-08: 650 mg via ORAL
  Filled 2020-05-08: qty 2

## 2020-05-08 MED ORDER — HEPARIN (PORCINE) 25000 UT/250ML-% IV SOLN
500.0000 [IU]/h | INTRAVENOUS | Status: DC
  Administered 2020-05-08: 500 [IU]/h via INTRAVENOUS
  Filled 2020-05-08: qty 250

## 2020-05-08 MED ORDER — IOHEXOL 300 MG/ML  SOLN
150.0000 mL | Freq: Once | INTRAMUSCULAR | Status: AC | PRN
  Administered 2020-05-08: 60 mL

## 2020-05-08 MED ORDER — CHLORHEXIDINE GLUCONATE 0.12 % MT SOLN: 15.0000 mL | Freq: Once | OROMUCOSAL | Status: AC

## 2020-05-08 MED ORDER — NITROGLYCERIN 1 MG/10 ML FOR IR/CATH LAB
INTRA_ARTERIAL | Status: AC
  Filled 2020-05-08: qty 10

## 2020-05-08 MED ORDER — CEFAZOLIN SODIUM-DEXTROSE 2-4 GM/100ML-% IV SOLN
INTRAVENOUS | Status: AC
  Filled 2020-05-08: qty 100

## 2020-05-08 MED ORDER — LIDOCAINE 2% (20 MG/ML) 5 ML SYRINGE
INTRAMUSCULAR | Status: DC | PRN
  Administered 2020-05-08: 60 mg via INTRAVENOUS

## 2020-05-08 MED ORDER — CLEVIDIPINE BUTYRATE 0.5 MG/ML IV EMUL
INTRAVENOUS | Status: AC
  Filled 2020-05-08: qty 50

## 2020-05-08 MED ORDER — AMOXICILLIN-POT CLAVULANATE 875-125 MG PO TABS
1.0000 | ORAL_TABLET | Freq: Two times a day (BID) | ORAL | Status: DC
  Administered 2020-05-08 – 2020-05-09 (×3): 1 via ORAL
  Filled 2020-05-08 (×4): qty 1

## 2020-05-08 MED ORDER — ACETAZOLAMIDE ER 500 MG PO CP12
1000.0000 mg | ORAL_CAPSULE | Freq: Two times a day (BID) | ORAL | Status: DC
  Administered 2020-05-08 – 2020-05-09 (×2): 1000 mg via ORAL
  Filled 2020-05-08 (×5): qty 2

## 2020-05-08 MED ORDER — CHLORHEXIDINE GLUCONATE CLOTH 2 % EX PADS
6.0000 | MEDICATED_PAD | Freq: Every day | CUTANEOUS | Status: DC
  Administered 2020-05-08: 6 via TOPICAL

## 2020-05-08 MED ORDER — FLUTICASONE PROPIONATE 50 MCG/ACT NA SUSP
2.0000 | Freq: Every day | NASAL | Status: DC | PRN
  Filled 2020-05-08: qty 16

## 2020-05-08 MED ORDER — LORATADINE 10 MG PO TABS
10.0000 mg | ORAL_TABLET | Freq: Every day | ORAL | Status: DC
  Administered 2020-05-09: 10 mg via ORAL
  Filled 2020-05-08: qty 1

## 2020-05-08 MED ORDER — AMISULPRIDE (ANTIEMETIC) 5 MG/2ML IV SOLN: 5.0000 mg | Freq: Once | INTRAVENOUS | Status: DC

## 2020-05-08 MED ORDER — CLOPIDOGREL BISULFATE 75 MG PO TABS
75.0000 mg | ORAL_TABLET | Freq: Once | ORAL | Status: AC
  Administered 2020-05-08: 75 mg via ORAL

## 2020-05-08 MED ORDER — CLOPIDOGREL BISULFATE 75 MG PO TABS
75.0000 mg | ORAL_TABLET | Freq: Every day | ORAL | Status: DC
  Administered 2020-05-09: 75 mg via ORAL
  Filled 2020-05-08: qty 1

## 2020-05-08 MED ORDER — PROMETHAZINE HCL 25 MG/ML IJ SOLN: 6.2500 mg | INTRAMUSCULAR | Status: DC | PRN

## 2020-05-08 MED ORDER — SUGAMMADEX SODIUM 200 MG/2ML IV SOLN
INTRAVENOUS | Status: DC | PRN
  Administered 2020-05-08: 200 mg via INTRAVENOUS

## 2020-05-08 MED ORDER — CEFAZOLIN SODIUM-DEXTROSE 2-3 GM-%(50ML) IV SOLR
INTRAVENOUS | Status: DC | PRN
  Administered 2020-05-08: 2 g via INTRAVENOUS

## 2020-05-08 MED ORDER — MIDAZOLAM HCL 5 MG/5ML IJ SOLN
INTRAMUSCULAR | Status: DC | PRN
  Administered 2020-05-08: 2 mg via INTRAVENOUS

## 2020-05-08 MED ORDER — PROPOFOL 10 MG/ML IV BOLUS
INTRAVENOUS | Status: DC | PRN
  Administered 2020-05-08: 200 mg via INTRAVENOUS

## 2020-05-08 MED ORDER — ACETAMINOPHEN 10 MG/ML IV SOLN
INTRAVENOUS | Status: AC
  Filled 2020-05-08: qty 100

## 2020-05-08 MED ORDER — FENTANYL CITRATE (PF) 100 MCG/2ML IJ SOLN
INTRAMUSCULAR | Status: AC
  Filled 2020-05-08: qty 2

## 2020-05-08 MED ORDER — CLOPIDOGREL BISULFATE 75 MG PO TABS
ORAL_TABLET | ORAL | Status: AC
  Filled 2020-05-08: qty 1

## 2020-05-08 MED ORDER — HEPARIN SODIUM (PORCINE) 1000 UNIT/ML IJ SOLN
INTRAMUSCULAR | Status: DC | PRN
  Administered 2020-05-08: 2000 [IU] via INTRAVENOUS
  Administered 2020-05-08 (×2): 1000 [IU] via INTRAVENOUS

## 2020-05-08 MED ORDER — ORAL CARE MOUTH RINSE: 15.0000 mL | Freq: Once | OROMUCOSAL | Status: AC

## 2020-05-08 MED ORDER — FENTANYL CITRATE (PF) 250 MCG/5ML IJ SOLN: INTRAMUSCULAR | Status: DC | PRN

## 2020-05-08 MED ORDER — LACTATED RINGERS IV SOLN: INTRAVENOUS | Status: DC | PRN

## 2020-05-08 MED ORDER — LACTATED RINGERS IV SOLN: INTRAVENOUS | Status: DC

## 2020-05-08 MED ORDER — DEXAMETHASONE SODIUM PHOSPHATE 10 MG/ML IJ SOLN
INTRAMUSCULAR | Status: DC | PRN
  Administered 2020-05-08: 5 mg via INTRAVENOUS

## 2020-05-08 MED ORDER — ACETAMINOPHEN 160 MG/5ML PO SOLN: 650.0000 mg | ORAL | Status: DC | PRN

## 2020-05-08 MED ORDER — HEPARIN (PORCINE) 25000 UT/250ML-% IV SOLN
600.0000 [IU]/h | INTRAVENOUS | Status: AC
  Filled 2020-05-08: qty 250

## 2020-05-08 NOTE — Anesthesia Procedure Notes (Signed)
Arterial Line Insertion Start/End1/03/2021 8:30 AM, 05/08/2020 8:35 AM Performed by: Margarita Rana, CRNA, CRNA  Preanesthetic checklist: patient identified, IV checked, site marked, risks and benefits discussed, surgical consent, monitors and equipment checked, pre-op evaluation, timeout performed and anesthesia consent Lidocaine 1% used for infiltration Left, radial was placed Catheter size: 20 G Hand hygiene performed  and maximum sterile barriers used  Allen's test indicative of satisfactory collateral circulation Attempts: 1 Procedure performed without using ultrasound guided technique. Following insertion, dressing applied and Biopatch. Post procedure assessment: normal  Patient tolerated the procedure well with no immediate complications.

## 2020-05-08 NOTE — H&P (Deleted)
  The note originally documented on this encounter has been moved the the encounter in which it belongs.  

## 2020-05-08 NOTE — Anesthesia Postprocedure Evaluation (Signed)
Anesthesia Post Note  Patient: Brenda Molina  Procedure(s) Performed: STENT PLACEMENT (N/A )     Patient location during evaluation: PACU Anesthesia Type: General Level of consciousness: awake Pain management: pain level controlled Vital Signs Assessment: post-procedure vital signs reviewed and stable Respiratory status: spontaneous breathing, nonlabored ventilation, respiratory function stable and patient connected to nasal cannula oxygen Cardiovascular status: blood pressure returned to baseline and stable Postop Assessment: no apparent nausea or vomiting Anesthetic complications: no   No complications documented.  Last Vitals:  Vitals:   05/08/20 1505 05/08/20 1542  BP:  116/69  Pulse: 89   Resp: 16 15  Temp:    SpO2: 99%     Last Pain:  Vitals:   05/08/20 1449  TempSrc:   PainSc: Asleep                 Ryan P Ellender

## 2020-05-08 NOTE — Progress Notes (Signed)
NIR.  History of right TS/SS stenosis s/p endovascular revascularization using stent assisted angioplasty via right (venous) and left (arterial) femoral approaches this AM by Dr. Corliss Skains.  Patient evaluated in PACU following procedure by this PA alongside Dr. Corliss Skains. Patient awake and alert laying in bed. Complains of tenderness of bilateral groins, L>R. Denies headache, vision changes, tinnitus, dizziness, N/V.  Alert, awake, and oriented x3. Speech and comprehension intact. PERRL bilaterally. No facial asymmetry. Tongue midline. Can spontaneously move all extremities. No pronator drift. Distal pulses (DPs) 1+ bilaterally. Right (venous) and left (arterial) femoral puncture sites soft with tenderness, no active bleeding or hematoma.  Plan to transfer to neuro ICU for overnight observation. Advance diet as tolerated. Left leg straight x6 hours. NIR to follow.   Waylan Boga Louk, PA-C 05/08/2020, 3:43 PM

## 2020-05-08 NOTE — Progress Notes (Signed)
ANTICOAGULATION CONSULT NOTE - Initial Consult  Pharmacy Consult for heparin Indication: Post neur-IR procedure  No Known Allergies  Patient Measurements: Height: 5\' 3"  (160 cm) Weight: 108.9 kg (240 lb) IBW/kg (Calculated) : 52.4 Heparin Dosing Weight: 78.5 kg   Vital Signs: Temp: 97.7 F (36.5 C) (01/12 1148) Temp Source: Oral (01/12 0644) BP: 126/51 (01/12 1148) Pulse Rate: 99 (01/12 1148)  Labs: Recent Labs    05/08/20 0700  HGB 12.8  HCT 40.0  PLT 260  LABPROT 13.3  INR 1.1  CREATININE 0.81    Estimated Creatinine Clearance: 115.9 mL/min (by C-G formula based on SCr of 0.81 mg/dL).   Medical History: Past Medical History:  Diagnosis Date  . Allergy   . Anxiety   . Asthma    seasonal- allergy  . GERD (gastroesophageal reflux disease)    "some times"  . HELLP syndrome    post parteum  . PCOS (polycystic ovarian syndrome)   . Sleep apnea     Medications:  Medications Prior to Admission  Medication Sig Dispense Refill Last Dose  . acetaminophen (TYLENOL) 500 MG tablet Take 1,000 mg by mouth every 6 (six) hours as needed for moderate pain.   Past Week at Unknown time  . acetaZOLAMIDE (DIAMOX) 500 MG capsule Take 2 capsules (1,000 mg total) by mouth 2 (two) times daily. 360 capsule 3 05/07/2020 at Unknown time  . amoxicillin-clavulanate (AUGMENTIN) 875-125 MG tablet Take 1 tablet by mouth 2 (two) times daily.   05/07/2020 at Unknown time  . ascorbic acid (VITAMIN C) 500 MG tablet Take 500 mg by mouth daily.   05/07/2020 at Unknown time  . aspirin 325 MG tablet Take 325 mg by mouth daily.   05/08/2020 at 0550  . b complex vitamins capsule Take 1 capsule by mouth daily.   05/07/2020 at Unknown time  . cetirizine (ZYRTEC) 10 MG tablet Take 10 mg by mouth daily as needed for allergies.   05/07/2020 at Unknown time  . clopidogrel (PLAVIX) 75 MG tablet Take 1 tablet (75 mg total) by mouth daily. 4 tablet 0 05/08/2020 at 0550  . busPIRone (BUSPAR) 7.5 MG tablet TAKE 1  TABLET BY MOUTH TWICE A DAY (Patient not taking: No sig reported) 180 tablet 0   . fluticasone (FLONASE) 50 MCG/ACT nasal spray Place 2 sprays into both nostrils daily. (Patient taking differently: Place 2 sprays into both nostrils daily as needed for allergies.) 16 g 6 Unknown at Unknown time  . levonorgestrel (MIRENA, 52 MG,) 20 MCG/24HR IUD 1 each by Intrauterine route once.      . ondansetron (ZOFRAN) 4 MG tablet Take 1 tablet (4 mg total) by mouth every 8 (eight) hours as needed for nausea or vomiting. (Patient not taking: No sig reported) 20 tablet 1   . Polyethylene Glycol 400 (BLINK TEARS OP) Place 1 drop into both eyes daily as needed (dry eyes).     . sertraline (ZOLOFT) 100 MG tablet TAKE 1 TABLET BY MOUTH DAILY. START 1/2 PILL FOR 1 WEEK., THEN INCREASE TO FULL PILL IF TOLERATED. 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 Pharmacy consulted to manage heparin dosing per neuro-IR protocol.   H/H and Plt wnl. SCr wnl  Goal of Therapy:  Heparin level 0.1-0.25 units/ml Monitor platelets by anticoagulation protocol: Yes   Plan:  -Increase IV Heparin to 600 units/hr. Stop at 0800 tomorrow per  IR protocol -F/u 6 hr HL -Monitor closely for s/s of bleeding   Vinnie Level, PharmD., BCPS, BCCCP Clinical Pharmacist Please refer to [facility] for unit-specific pharmacist

## 2020-05-08 NOTE — Procedures (Signed)
S/P RT common carotid and RT TS ,IJV venograms followed by placement of an 40 mm Precise stent across high grade stenosis of the RT TS / SS stenosis.Marland Kitchen Post CT no ICH or mass effect. Patient extubated. Slowly waking up. Denies H/As,N/V or visual changes. Moves all 4s spontaneously and to command. Both groins soft. Distal pulses palpable. S.Ellysia Char MD

## 2020-05-08 NOTE — Anesthesia Procedure Notes (Signed)
Procedure Name: Intubation Date/Time: 05/08/2020 9:01 AM Performed by: Glynda Jaeger, CRNA Pre-anesthesia Checklist: Patient identified, Patient being monitored, Timeout performed, Emergency Drugs available and Suction available Patient Re-evaluated:Patient Re-evaluated prior to induction Oxygen Delivery Method: Circle System Utilized Preoxygenation: Pre-oxygenation with 100% oxygen Induction Type: IV induction Ventilation: Mask ventilation without difficulty Laryngoscope Size: Mac and 4 Grade View: Grade I Tube type: Oral Tube size: 7.5 mm Number of attempts: 1 Airway Equipment and Method: Stylet Placement Confirmation: ETT inserted through vocal cords under direct vision,  positive ETCO2 and breath sounds checked- equal and bilateral Secured at: 21 cm Tube secured with: Tape Dental Injury: Teeth and Oropharynx as per pre-operative assessment

## 2020-05-08 NOTE — Progress Notes (Signed)
ANTICOAGULATION CONSULT NOTE  Pharmacy Consult for heparin Indication: Post neur-IR procedure  No Known Allergies  Patient Measurements: Height: 5\' 3"  (160 cm) Weight: 108.9 kg (240 lb) IBW/kg (Calculated) : 52.4 Heparin Dosing Weight: 78.5 kg   Vital Signs: Temp: 97.6 F (36.4 C) (01/12 2000) Temp Source: Oral (01/12 2000) BP: 115/69 (01/12 2000) Pulse Rate: 89 (01/12 1505)  Labs: Recent Labs    05/08/20 0700 05/08/20 2118  HGB 12.8  --   HCT 40.0  --   PLT 260  --   LABPROT 13.3  --   INR 1.1  --   HEPARINUNFRC  --  0.23*  CREATININE 0.81  --     Estimated Creatinine Clearance: 115.9 mL/min (by C-G formula based on SCr of 0.81 mg/dL).   Medical History: Past Medical History:  Diagnosis Date  . Allergy   . Anxiety   . Asthma    seasonal- allergy  . GERD (gastroesophageal reflux disease)    "some times"  . HELLP syndrome    post parteum  . PCOS (polycystic ovarian syndrome)   . Sleep apnea     Medications:  Medications Prior to Admission  Medication Sig Dispense Refill Last Dose  . acetaminophen (TYLENOL) 500 MG tablet Take 1,000 mg by mouth every 6 (six) hours as needed for moderate pain.   Past Week at Unknown time  . acetaZOLAMIDE (DIAMOX) 500 MG capsule Take 2 capsules (1,000 mg total) by mouth 2 (two) times daily. 360 capsule 3 05/07/2020 at Unknown time  . amoxicillin-clavulanate (AUGMENTIN) 875-125 MG tablet Take 1 tablet by mouth 2 (two) times daily.   05/07/2020 at Unknown time  . ascorbic acid (VITAMIN C) 500 MG tablet Take 500 mg by mouth daily.   05/07/2020 at Unknown time  . aspirin 325 MG tablet Take 325 mg by mouth daily.   05/08/2020 at 0550  . b complex vitamins capsule Take 1 capsule by mouth daily.   05/07/2020 at Unknown time  . cetirizine (ZYRTEC) 10 MG tablet Take 10 mg by mouth daily as needed for allergies.   05/07/2020 at Unknown time  . clopidogrel (PLAVIX) 75 MG tablet Take 1 tablet (75 mg total) by mouth daily. 4 tablet 0 05/08/2020  at 0550  . busPIRone (BUSPAR) 7.5 MG tablet TAKE 1 TABLET BY MOUTH TWICE A DAY (Patient not taking: No sig reported) 180 tablet 0   . fluticasone (FLONASE) 50 MCG/ACT nasal spray Place 2 sprays into both nostrils daily. (Patient taking differently: Place 2 sprays into both nostrils daily as needed for allergies.) 16 g 6 Unknown at Unknown time  . levonorgestrel (MIRENA, 52 MG,) 20 MCG/24HR IUD 1 each by Intrauterine route once.      . ondansetron (ZOFRAN) 4 MG tablet Take 1 tablet (4 mg total) by mouth every 8 (eight) hours as needed for nausea or vomiting. (Patient not taking: No sig reported) 20 tablet 1   . Polyethylene Glycol 400 (BLINK TEARS OP) Place 1 drop into both eyes daily as needed (dry eyes).     . sertraline (ZOLOFT) 100 MG tablet TAKE 1 TABLET BY MOUTH DAILY. START 1/2 PILL FOR 1 WEEK., THEN INCREASE TO FULL PILL IF TOLERATED. 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 Pharmacy consulted to manage heparin dosing per neuro-IR protocol.   Initial heparin level came back at 0.23, within goal range, on 600 units/hr.  CBC stable, no s/sx of bleeding or infusion issues.   Goal of Therapy:  Heparin level 0.1-0.25 units/ml Monitor platelets by anticoagulation protocol: Yes   Plan:  -Continue IV Heparin at 600 units/hr. Stop at 0800 tomorrow per IR protocol -Monitor closely for s/s of bleeding   Sherron Monday, PharmD, BCCCP Clinical Pharmacist  Phone: [phone] 05/08/2020 10:14 PM  Please check AMION for all [facility] phone numbers After 10:00 PM, call Main Pharmacy [phone]

## 2020-05-08 NOTE — Sedation Documentation (Signed)
Left femoral arterial sheath removed. 64fr exoseal closure device deployed.

## 2020-05-08 NOTE — H&P (Signed)
Chief Complaint: Patient was seen in consultation today for right TS/SS junction stenosis/revascularizaiton.  Referring Physician(s): Huston FoleyAthar, Saima (neurology)  Supervising Physician: Julieanne Cottoneveshwar, Sanjeev  [facility]  History of Present Illness: Brenda Molina is a 35 y.o. female with a past medical history of headaches asthma, GERD, PCOS, and anxiety. She has struggled with headaches associated with blurred vision and right tinnitus, managed by Dr. Frances FurbishAthar. Imaging revealed possible bilateral TS/SS stenosis, possible cause of headaches. She underwent an image-guided diagnostic cerebral arteriogram 04/02/2020 by Dr. Corliss Skainseveshwar which confirmed bilateral severe TS/SS stenosis. At that time, patient decided to pursue endovascular revascularization of TS/SS stenosis. Patient was scheduled for procedure 05/01/2020, however had to be rescheduled secondary to a right axillary boil (need for PO antibiotics prior to stent placement per Dr. Corliss Skainseveshwar).  Diagnostic cerebral arteriogram 04/02/2020: 1. Bilateral severe decrease in caliber of the distal right transverse sinus/sigmoid sinus junction, and of the left distal transverse sinus/sigmoid sinus junction suggestive of high-grade stenosis.   Patient presents today for possible image-guided cerebral arteriogram with possible revascularization (angioplasty, stent placement) of right TS/SS junction stenosis. Patient awake and alert sitting in bed. Complains of constant blurred vision, R>L. Complains of intermittent headaches, last headache last Saturday. Denies fever, chills, chest pain, dyspnea, abdominal pain, or tinnitus.  Currently taking Plavix 75 mg once daily and Aspirin 325 mg once daily.   Past Medical History:  Diagnosis Date  . Allergy   . Anxiety   . Asthma    seasonal- allergy  . GERD (gastroesophageal reflux disease)    "some times"  . HELLP syndrome    post parteum  . PCOS (polycystic ovarian syndrome)   . Sleep  apnea     Past Surgical History:  Procedure Laterality Date  . IR ANGIO INTRA EXTRACRAN SEL COM CAROTID INNOMINATE BILAT MOD SED  04/02/2020  . IR ANGIO VERTEBRAL SEL VERTEBRAL BILAT MOD SED  04/02/2020  . IR RADIOLOGIST EVAL & MGMT  02/28/2020  . RADIOLOGY WITH ANESTHESIA N/A 05/01/2020   Procedure: STENTING;  Surgeon: Julieanne Cottoneveshwar, Sanjeev, MD;  Location: [facility] OR;  Service: Radiology;  Laterality: N/A;  . WISDOM TOOTH EXTRACTION      Allergies: Patient has no known allergies.  Medications: Prior to Admission medications   Medication Sig Start Date End Date Taking? Authorizing Provider  acetaminophen (TYLENOL) 500 MG tablet Take 1,000 mg by mouth every 6 (six) hours as needed for moderate pain.   Yes [provider]  acetaZOLAMIDE (DIAMOX) 500 MG capsule Take 2 capsules (1,000 mg total) by mouth 2 (two) times daily. 04/04/20  Yes Huston FoleyAthar, Saima, MD  amoxicillin-clavulanate (AUGMENTIN) 875-125 MG tablet Take 1 tablet by mouth 2 (two) times daily. 05/04/20  Yes [provider]  ascorbic acid (VITAMIN C) 500 MG tablet Take 500 mg by mouth daily.   Yes [provider]  aspirin 325 MG tablet Take 325 mg by mouth daily.   Yes [provider]  b complex vitamins capsule Take 1 capsule by mouth daily.   Yes [provider]  cetirizine (ZYRTEC) 10 MG tablet Take 10 mg by mouth daily as needed for allergies.   Yes [provider]  clopidogrel (PLAVIX) 75 MG tablet Take 1 tablet (75 mg total) by mouth daily. 04/29/20  Yes Covington, Asher MuirJamie R, NP  busPIRone (BUSPAR) 7.5 MG tablet TAKE 1 TABLET BY MOUTH TWICE A DAY Patient not taking: No sig reported 11/03/19   Shade FloodGreene, Jeffrey R, MD  fluticasone [facility](FLONASE) 50 MCG/ACT  nasal spray Place 2 sprays into both nostrils daily. Patient taking differently: Place 2 sprays into both nostrils daily as needed for allergies. 12/09/17   Weber, Dema Severin, PA-C  levonorgestrel (MIRENA, 52 MG,) 20 MCG/24HR IUD 1 each by Intrauterine  route once.     [provider]  ondansetron (ZOFRAN) 4 MG tablet Take 1 tablet (4 mg total) by mouth every 8 (eight) hours as needed for nausea or vomiting. Patient not taking: No sig reported 02/20/20   Huston Foley, MD  Polyethylene Glycol 400 (BLINK TEARS OP) Place 1 drop into both eyes daily as needed (dry eyes).    [provider]  sertraline (ZOLOFT) 100 MG tablet TAKE 1 TABLET BY MOUTH DAILY. START 1/2 PILL FOR 1 WEEK., THEN INCREASE TO FULL PILL IF TOLERATED. Patient not taking: No sig reported 11/03/19   Shade Flood, MD     Family History  Problem Relation Age of Onset  . Asthma Father   . Hypertension Father     Social History   Socioeconomic History  . Marital status: Single    Spouse name: Not on file  . Number of children: 1  . Years of education: Not on file  . Highest education level: Not on file  Occupational History  . Not on file  Tobacco Use  . Smoking status: Never Smoker  . Smokeless tobacco: Never Used  Vaping Use  . Vaping Use: Never used  Substance and Sexual Activity  . Alcohol use: Not Currently  . Drug use: No  . Sexual activity: Yes    Partners: Male    Birth control/protection: Condom  Other Topics Concern  . 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 Has no children. 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  All other systems are negative.  Review of Systems  Constitutional: Negative for chills and fever.  HENT: Negative for tinnitus.   Eyes: Positive for visual disturbance.  Respiratory: Negative for  shortness of breath and wheezing.   Cardiovascular: Negative for chest pain and palpitations.  Gastrointestinal: Negative for abdominal pain.  Neurological: Positive for headaches.  Psychiatric/Behavioral: Negative for behavioral problems and confusion.    Vital Signs: BP 124/77   Pulse 98   Temp 98.4 F (36.9 C) (Oral)   Resp 18   Ht 5\' 3"  (1.6 m)   Wt 240 lb (108.9 kg)   SpO2 97%   BMI 42.51 kg/m   Physical Exam Vitals and nursing note reviewed.  Constitutional:      General: She is not in acute distress.    Appearance: Normal appearance.  Cardiovascular:     Rate and Rhythm: Normal rate and regular rhythm.     Heart sounds: Normal heart sounds. No murmur heard.   Pulmonary:     Effort: Pulmonary effort is normal. No respiratory distress.     Breath sounds: Normal breath sounds. No wheezing.  Skin:    General: Skin is warm and dry.     Comments: Right axilla with open  wound, wet, non-tender, no bloody or purulent discharge noted.  Neurological:     Mental Status: She is alert and oriented to person, place, and time.      MD Evaluation Airway: WNL Heart: WNL Abdomen: WNL Chest/ Lungs: WNL ASA  Classification: 2 Mallampati/Airway Score: Two   Imaging: No results found.  Labs:  CBC: Recent Labs    02/15/20 1313 04/02/20 0930 04/02/20 1055 05/01/20 0642 05/08/20 0700  WBC 10.9* 7.5  --  8.9 7.8  HGB 14.4 13.6 14.3 12.2 12.8  HCT 44.0 41.3 42.0 38.6 40.0  PLT 310 283  --  258 260    COAGS: Recent Labs    04/02/20 1050 05/01/20 0642 05/08/20 0700  INR 1.0 1.1 1.1  APTT  --  30  --     BMP: Recent Labs    02/15/20 1313 04/02/20 1055 05/01/20 0642 05/08/20 0700  NA 138 143 139 138  K 3.4* 3.7 3.8 3.3*  CL 101 107 110 107  CO2 26  --  20* 22  GLUCOSE 103* 102* 121* 124*  BUN 12 9 13 12   CALCIUM 9.3  --  9.1 9.1  CREATININE 0.78 1.00 0.98 0.81  GFRNONAA >60  --  >60 >60     Assessment and Plan:  Right TS/SS junction  stenosis. Plan for image-guided cerebral arteriogram with possible revascularization (angioplasty, stent placement) of right TS/SS junction stenosis today in IR with Dr. . Patient is NPO. Afebrile. Ok to proceed with Plavix/Aspirin use per Dr. Corliss Skains. INR 1.1 this AM. P2Y12 137 PRU this AM. COVID negative 05/06/2020. Patient currently on day 3 of appropriate antibiotic for right axilla boil.  Risks and benefits of cerebral arteriogram with intervention were discussed with the patient including, but not limited to bleeding, infection, vascular injury, contrast induced renal failure, stroke, reperfusion hemorrhage, or even death. This interventional procedure involves the use of X-rays and because of the nature of the planned procedure, it is possible that we will have prolonged use of X-ray fluoroscopy. Potential radiation risks to you include (but are not limited to) the following: - A slightly elevated risk for cancer  several years later in life. This risk is typically less than 0.5% percent. This risk is low in comparison to the normal incidence of human cancer, which is 33% for women and 50% for men according to the American Cancer Society. - Radiation induced injury can include skin redness, resembling a rash, tissue breakdown / ulcers and hair loss (which can be temporary or permanent).  The likelihood of either of these occurring depends on the difficulty of the procedure and whether you are sensitive to radiation due to previous procedures, disease, or genetic conditions.  IF your procedure requires a prolonged use of radiation, you will be notified and given written instructions for further action.  It is your responsibility to monitor the irradiated area for the 2 weeks following the procedure and to notify your physician if you are concerned that you have suffered a radiation induced injury.   All of the patient's questions were answered, patient is agreeable to  proceed. Consent signed and in chart.   Thank you for this interesting consult.  I greatly enjoyed meeting NATLIE ASFOUR and look forward to participating in their care.  A copy of this report was sent to the requesting provider on this date.  Electronically Signed: Rush Barer, PA-C 05/08/2020, 8:28 AM   I spent a total of 40 Minutes in face to face in  clinical consultation, greater than 50% of which was counseling/coordinating care for right TS/SS junction stenosis/revascularizaiton.

## 2020-05-08 NOTE — Transfer of Care (Signed)
Immediate Anesthesia Transfer of Care Note  Patient: Brenda Molina  Procedure(s) Performed: Francine Graven PLACEMENT (N/A )  Patient Location: PACU  Anesthesia Type:General  Level of Consciousness: awake, alert , patient cooperative and responds to stimulation  Airway & Oxygen Therapy: Patient Spontanous Breathing and Patient connected to face mask oxygen  Post-op Assessment: Report given to RN, Post -op Vital signs reviewed and stable and Patient moving all extremities X 4  Post vital signs: Reviewed and stable  Last Vitals:  Vitals Value Taken Time  BP 126/51 05/08/20 1148  Temp    Pulse 95 05/08/20 1157  Resp 14 05/08/20 1157  SpO2 98 % 05/08/20 1157  Vitals shown include unvalidated device data.  Last Pain:  Vitals:   05/08/20 0703  TempSrc:   PainSc: 0-No pain         Complications: No complications documented.

## 2020-05-09 ENCOUNTER — Encounter (HOSPITAL_COMMUNITY): Payer: Self-pay | Admitting: Interventional Radiology

## 2020-05-09 ENCOUNTER — Other Ambulatory Visit: Payer: Self-pay | Admitting: Radiology

## 2020-05-09 DIAGNOSIS — H93A1 Pulsatile tinnitus, right ear: Secondary | ICD-10-CM

## 2020-05-09 MED ORDER — MUPIROCIN CALCIUM 2 % EX CREA
TOPICAL_CREAM | Freq: Two times a day (BID) | CUTANEOUS | Status: DC
  Filled 2020-05-09: qty 15

## 2020-05-09 MED ORDER — MUPIROCIN CALCIUM 2 % EX CREA: TOPICAL_CREAM | Freq: Two times a day (BID) | CUTANEOUS | 0 refills | Status: DC

## 2020-05-09 MED ORDER — INFLUENZA VAC SPLIT QUAD 0.5 ML IM SUSY
0.5000 mL | PREFILLED_SYRINGE | INTRAMUSCULAR | Status: AC
  Administered 2020-05-09: 0.5 mL via INTRAMUSCULAR
  Filled 2020-05-09: qty 0.5

## 2020-05-09 NOTE — Plan of Care (Signed)
  Problem: Education: Goal: Knowledge of General Education information will improve Description: Including pain rating scale, medication(s)/side effects and non-pharmacologic comfort measures Outcome: Completed/Met   Problem: Health Behavior/Discharge Planning: Goal: Ability to manage health-related needs will improve Outcome: Completed/Met   Problem: Clinical Measurements: Goal: Ability to maintain clinical measurements within normal limits will improve Outcome: Completed/Met Goal: Will remain free from infection Outcome: Completed/Met Goal: Diagnostic test results will improve Outcome: Completed/Met Goal: Respiratory complications will improve Outcome: Completed/Met Goal: Cardiovascular complication will be avoided Outcome: Completed/Met   Problem: Activity: Goal: Risk for activity intolerance will decrease Outcome: Completed/Met   Problem: Nutrition: Goal: Adequate nutrition will be maintained Outcome: Completed/Met   Problem: Coping: Goal: Level of anxiety will decrease Outcome: Completed/Met   Problem: Elimination: Goal: Will not experience complications related to bowel motility Outcome: Completed/Met Goal: Will not experience complications related to urinary retention Outcome: Completed/Met   Problem: Pain Managment: Goal: General experience of comfort will improve Outcome: Completed/Met   Problem: Safety: Goal: Ability to remain free from injury will improve Outcome: Completed/Met   Problem: Skin Integrity: Goal: Risk for impaired skin integrity will decrease Outcome: Completed/Met    Pt given discharge instructions and medications going home with patient. Pt verbalizes understanding. Vital signs within normal limits. Neurovascular intact.

## 2020-05-09 NOTE — Discharge Summary (Signed)
Physician Discharge Summary      Patient ID: OKLA QAZI MRN: [id number] DOB/AGE: [birth year] 35 y.o.  Admit date: 05/08/2020 Discharge date: 05/09/2020  Admission Diagnoses: Active Problems:   Pulsatile tinnitus of right ear  Discharge Diagnoses:  Active Problems:   Pulsatile tinnitus of right ear    Procedures: Procedure(s): RT common carotid and RT TS ,IJV venograms followed by placement of an 40 mm Precise stent across high grade stenosis of the RT TS / SS stenosis  Discharged Condition: good  Hospital Course: Pt admitted on 1/12 after successful procedure as above. No immediate complications. Sent to Neuro ICU for overnight observation. POD#1 pt feeling well. Already noticed less tinnitus. Denies blurred vision, headaches, N/V. Minimal discomfort. Some soreness in bilat groin stick sites. Tolerating diet but has not yet been OOB. Otherwise feel pt stable for discharge. Discussed all medications, instructions/restrictions, and follow up plans in detail. Pt will be scheduled 2 week follow up appointment. Will continue taking all medications as they were pre-op including Diamox. Hopefully will be able to wean off Diamox over the next couple weeks as symptoms subside/resolve.   Consults: None   Discharge Exam: Blood pressure 106/60, pulse (!) 55, temperature 98.3 F (36.8 C), temperature source Oral, resp. rate (!) 23, height 5\' 3"  (1.6 m), weight 108.9 kg, SpO2 99 %. Gen: NAD, A&O x 3 Lungs: CTA without w/r/r Heart: Regular Neuro: Awake and oriented. Face symmetric, tongue midline. PERRLA, EMOI Fine motor and UE and LE strength intact Ext: Rt groin(CFV) stick site soft, NT         Lt groin(CFA) stick site soft, NT Good bilat pedal pulses, legs/feet warm   Disposition: Discharge disposition: 01-Home or Self Care       Discharge Instructions    Call MD for:  difficulty breathing, headache or visual disturbances   Complete by: As directed     Call MD for:  persistant nausea and vomiting   Complete by: As directed    Call MD for:  redness, tenderness, or signs of infection (pain, swelling, redness, odor or green/yellow discharge around incision site)   Complete by: As directed    Call MD for:  severe uncontrolled pain   Complete by: As directed    Call MD for:  temperature >100.4   Complete by: As directed    Diet - low sodium heart healthy   Complete by: As directed    Also drink plenty of water   Driving Restrictions   Complete by: As directed    Only drive if necessary x 2 weeks   Increase activity slowly   Complete by: As directed    Lifting restrictions   Complete by: As directed    Avoid pushing/pulling/lifting greater than 10 lbs for 2 weeks.   No dressing needed   Complete by: As directed      Allergies as of 05/09/2020   No Known Allergies     Medication List    TAKE these medications   acetaminophen 500 MG tablet Commonly known as: TYLENOL Take 1,000 mg by mouth every 6 (six) hours as needed for moderate pain.   acetaZOLAMIDE 500 MG capsule Commonly known as: DIAMOX Take 2 capsules (1,000 mg total) by mouth 2 (two) times daily.   amoxicillin-clavulanate 875-125 MG tablet Commonly known as: AUGMENTIN Take 1 tablet by mouth 2 (two) times daily.   ascorbic acid 500 MG tablet Commonly known as: VITAMIN C Take 500 mg by mouth daily.   aspirin  325 MG tablet Take 325 mg by mouth daily.   b complex vitamins capsule Take 1 capsule by mouth daily.   BLINK TEARS OP Place 1 drop into both eyes daily as needed (dry eyes).   busPIRone 7.5 MG tablet Commonly known as: BUSPAR TAKE 1 TABLET BY MOUTH TWICE A DAY   cetirizine 10 MG tablet Commonly known as: ZYRTEC Take 10 mg by mouth daily as needed for allergies.   clopidogrel 75 MG tablet Commonly known as: Plavix Take 1 tablet (75 mg total) by mouth daily.   fluticasone 50 MCG/ACT nasal spray Commonly known as: FLONASE Place 2 sprays into both  nostrils daily. What changed:   when to take this  reasons to take this   Mirena (52 MG) 20 MCG/24HR IUD Generic drug: levonorgestrel 1 each by Intrauterine route once.   mupirocin cream 2 % Commonly known as: BACTROBAN Apply topically 2 (two) times daily.   ondansetron 4 MG tablet Commonly known as: Zofran Take 1 tablet (4 mg total) by mouth every 8 (eight) hours as needed for nausea or vomiting.   sertraline 100 MG tablet Commonly known as: ZOLOFT TAKE 1 TABLET BY MOUTH DAILY. START 1/2 PILL FOR 1 WEEK., THEN INCREASE TO FULL PILL IF TOLERATED.            Discharge Care Instructions  (From admission, onward)         Start     Ordered   05/09/20 0000  No dressing needed        05/09/20 1058          Follow-up Information    Julieanne Cotton, MD. Go in 2 week(s).   Specialties: Interventional Radiology, Radiology Why: Clinic will call you to schedule follow up appointment Contact information: [address] [place] [address] [phone]               Signed: Brayton El PA-C 05/09/2020, 10:58 AM

## 2020-05-09 NOTE — Discharge Instructions (Signed)
Cerebral Angiogram, Care After This sheet gives you information about how to care for yourself after your procedure. Your health care provider may also give you more specific instructions. If you have problems or questions, contact your health care provider. What can I expect after the procedure? After the procedure, it is common to have:  Bruising and tenderness at the catheter insertion site.  A mild headache. Follow these instructions at home: Insertion site care  Follow instructions from your health care provider about how to take care of the insertion site. Make sure you: ? Wash your hands with soap and water before and after you change your bandage (dressing). If soap and water are not available, use hand sanitizer. ? Change your dressing as told by your health care provider.  Do not take baths, swim, or use a hot tub until your health care provider approves. You may shower 24-48 hours after the procedure, or as told by your health care provider.  To clean your insertion site: ? Gently wash the site with plain soap and water. ? Pat the area dry with a clean towel. ? Do not rub the site. This may cause bleeding.  Do not apply powder or lotion to the site. Keep the site clean and dry. Infection signs Check your incision area every day for signs of infection. Check for:  Redness, swelling, or pain.  Fluid or blood.  Warmth.  Pus or a bad smell.   Activity  Do not drive for 24 hours if you were given a sedative during your procedure.  Rest as told by your health care provider.  Do not lift anything that is heavier than 10 lb (4.5 kg), or the limit that you are told, until your health care provider says that it is safe.  Return to your normal activities as told by your health care provider, usually in about a week. Ask your health care provider what activities are safe for you. General instructions  If your insertion site starts to bleed, lie flat and put pressure on the  site. If the bleeding does not stop, get help right away. This is a medical emergency.  Do not use any products that contain nicotine or tobacco, such as cigarettes, e-cigarettes, and chewing tobacco. If you need help quitting, ask your health care provider.  Take over-the-counter and prescription medicines only as told by your health care provider.  Drink enough fluid to keep your urine pale yellow. This helps flush the contrast dye from your body.  Keep all follow-up visits as directed by your health care provider. This is important.   Contact a health care provider if:  You have a fever or chills.  You have redness, swelling, or pain around your insertion site.  You have fluid or blood coming from your insertion site.  The insertion site feels warm to the touch.  You have pus or a bad smell coming from your insertion site.  You notice blood collecting in the tissue around the insertion site (hematoma). The hematoma may be painful to the touch. Get help right away if:  You have chest pain or trouble breathing.  You have severe pain or swelling at the insertion site.  The insertion area bleeds, and bleeding continues after 30 minutes of holding steady pressure on the site.  The arm or leg where the catheter was inserted is pale, cold, numb, tingling, or weak.  You have a rash.  You have any symptoms of a stroke. "BE FAST" is   an easy way to remember the main warning signs of a stroke: ? B - Balance. Signs are dizziness, sudden trouble walking, or loss of balance. ? E - Eyes. Signs are trouble seeing or a sudden change in vision. ? F - Face. Signs are sudden weakness or numbness of the face, or the face or eyelid drooping on one side. ? A - Arms. Signs are weakness or numbness in an arm. This happens suddenly and usually on one side of the body. ? S - Speech. Signs are sudden trouble speaking, slurred speech, or trouble understanding what people say. ? T - Time. Time to call  emergency services. Write down what time symptoms started.  You have other signs of a stroke, such as: ? A sudden, severe headache with no known cause. ? Nausea or vomiting. ? Seizure. These symptoms may represent a serious problem that is an emergency. Do not wait to see if the symptoms will go away. Get medical help right away. Call your local emergency services (911 in the [place] Do not drive yourself to the hospital. Summary  Bruising and tenderness at the insertion site are common.  Follow your health care provider's instructions about caring for your insertion site. Change dressing and clean the area as instructed.  If your insertion site bleeds, apply direct pressure until bleeding stops.  Return to your normal activities as told by your health care provider. Ask what activities are safe.  Rest and drink plenty of fluids. This information is not intended to replace advice given to you by your health care provider. Make sure you discuss any questions you have with your health care provider. Document Revised: 11/01/2018 Document Reviewed: 11/01/2018 Elsevier Patient Education  2021 Elsevier Inc.  

## 2020-05-13 ENCOUNTER — Encounter (HOSPITAL_COMMUNITY): Payer: Self-pay | Admitting: Interventional Radiology

## 2020-05-14 ENCOUNTER — Encounter (HOSPITAL_COMMUNITY): Payer: Self-pay

## 2020-05-14 ENCOUNTER — Telehealth: Payer: Self-pay | Admitting: Student

## 2020-05-14 ENCOUNTER — Other Ambulatory Visit: Payer: Self-pay

## 2020-05-14 ENCOUNTER — Telehealth (HOSPITAL_COMMUNITY): Payer: Self-pay | Admitting: *Deleted

## 2020-05-14 ENCOUNTER — Other Ambulatory Visit (HOSPITAL_COMMUNITY): Payer: Self-pay | Admitting: Interventional Radiology

## 2020-05-14 ENCOUNTER — Other Ambulatory Visit: Payer: Self-pay | Admitting: Radiology

## 2020-05-14 ENCOUNTER — Ambulatory Visit (HOSPITAL_COMMUNITY)
Admission: RE | Admit: 2020-05-14 | Discharge: 2020-05-14 | Disposition: A | Discharge status: Home / Self Care | Payer: No Typology Code available for payment source | Source: Ambulatory Visit | Attending: Interventional Radiology | Admitting: Interventional Radiology

## 2020-05-14 DIAGNOSIS — S301XXA Contusion of abdominal wall, initial encounter: Secondary | ICD-10-CM

## 2020-05-14 DIAGNOSIS — S3012XA Contusion of groin, initial encounter: Secondary | ICD-10-CM

## 2020-05-14 NOTE — Telephone Encounter (Addendum)
NIR.  History of right TS/SS stenosis s/p endovascular revascularization using stent assisted angioplasty via right (venous) and left (arterial) femoral approaches 05/08/2020 by Dr. Corliss Skains.  Received call from patient requesting further evaluation regarding left groin pain/swelling/bruising. Called patient at [phone] to discuss.  Patient states that since Friday she has had pain/swelling/bruising of left groin. States that puncture site is soft, however surrounding area is very bruised and "tight". States she has pain at this location with coughing, movements, taking a step with left foot. States she is still able to move foot. States there is some numbness at left groin area a well. Discussed with Dr. Corliss Skains who recommends STAT VAS [place] of left groin to R/O pseudoaneurysm. NIR schedulers working on scheduling this [place]- they will call patient regarding time.  Below are recommendations given to patient: 1- Come to [facility] for VAS [place] left groin (NIR schedulers to call patient to set up this appointment). 2- Until results, no stooping, bending, or lifting more than 10 pounds. 3- Avoid taking stairs. If you must walk up/down stairs please use hand as counter pressure to left groin.  All questions answered and concerns addressed. Patient conveys understanding and agrees with plan. Please call NIR with questions/concerns.    ADDENDUM: Received preliminary results from VAS [place] today- no evidence of pseudoaneurysm. Called patient at [phone] to inform her. In addition, recommend patient continue same above recommendations for 7 additional days: 1- No stooping, bending, or lifting more than 10 pounds. 2- Avoid taking stairs. If you must walk up/down stairs please use hand as counter pressure to left groin. All questions answered and concerns addressed. Patient conveys understanding and agrees with plan. Please call NIR with questions/concerns.   Waylan Boga Nohemy Koop, PA-C 05/14/2020, 3:34 PM

## 2020-05-14 NOTE — CV Procedure (Signed)
LLE Arterial duplex limited completed. Dr. Corliss Skains called with priliminary finidings.  Results can be found under chart review under CV PROC. 05/14/2020 4:03 PM [name] RVT, RDMS

## 2020-05-21 ENCOUNTER — Ambulatory Visit: Payer: No Typology Code available for payment source | Admitting: Neurology

## 2020-05-22 ENCOUNTER — Encounter: Payer: Self-pay | Admitting: Family Medicine

## 2020-05-22 ENCOUNTER — Ambulatory Visit (INDEPENDENT_AMBULATORY_CARE_PROVIDER_SITE_OTHER): Payer: No Typology Code available for payment source | Admitting: Family Medicine

## 2020-05-22 ENCOUNTER — Ambulatory Visit (INDEPENDENT_AMBULATORY_CARE_PROVIDER_SITE_OTHER): Payer: No Typology Code available for payment source

## 2020-05-22 ENCOUNTER — Other Ambulatory Visit: Payer: Self-pay

## 2020-05-22 VITALS — BP 123/79 | HR 106 | Temp 97.6°F | Ht 63.0 in | Wt 246.0 lb

## 2020-05-22 DIAGNOSIS — H93A1 Pulsatile tinnitus, right ear: Secondary | ICD-10-CM | POA: Diagnosis not present

## 2020-05-22 DIAGNOSIS — R059 Cough, unspecified: Secondary | ICD-10-CM | POA: Diagnosis not present

## 2020-05-22 DIAGNOSIS — G932 Benign intracranial hypertension: Secondary | ICD-10-CM | POA: Diagnosis not present

## 2020-05-22 DIAGNOSIS — L02411 Cutaneous abscess of right axilla: Secondary | ICD-10-CM | POA: Diagnosis not present

## 2020-05-22 IMAGING — DX DG CHEST 2V
2 series · 2 of 2 positions shown · non-contrast
Comparison: [DATE]

CLINICAL DATA: Cough

EXAM:
CHEST - 2 VIEW

[chest lat]
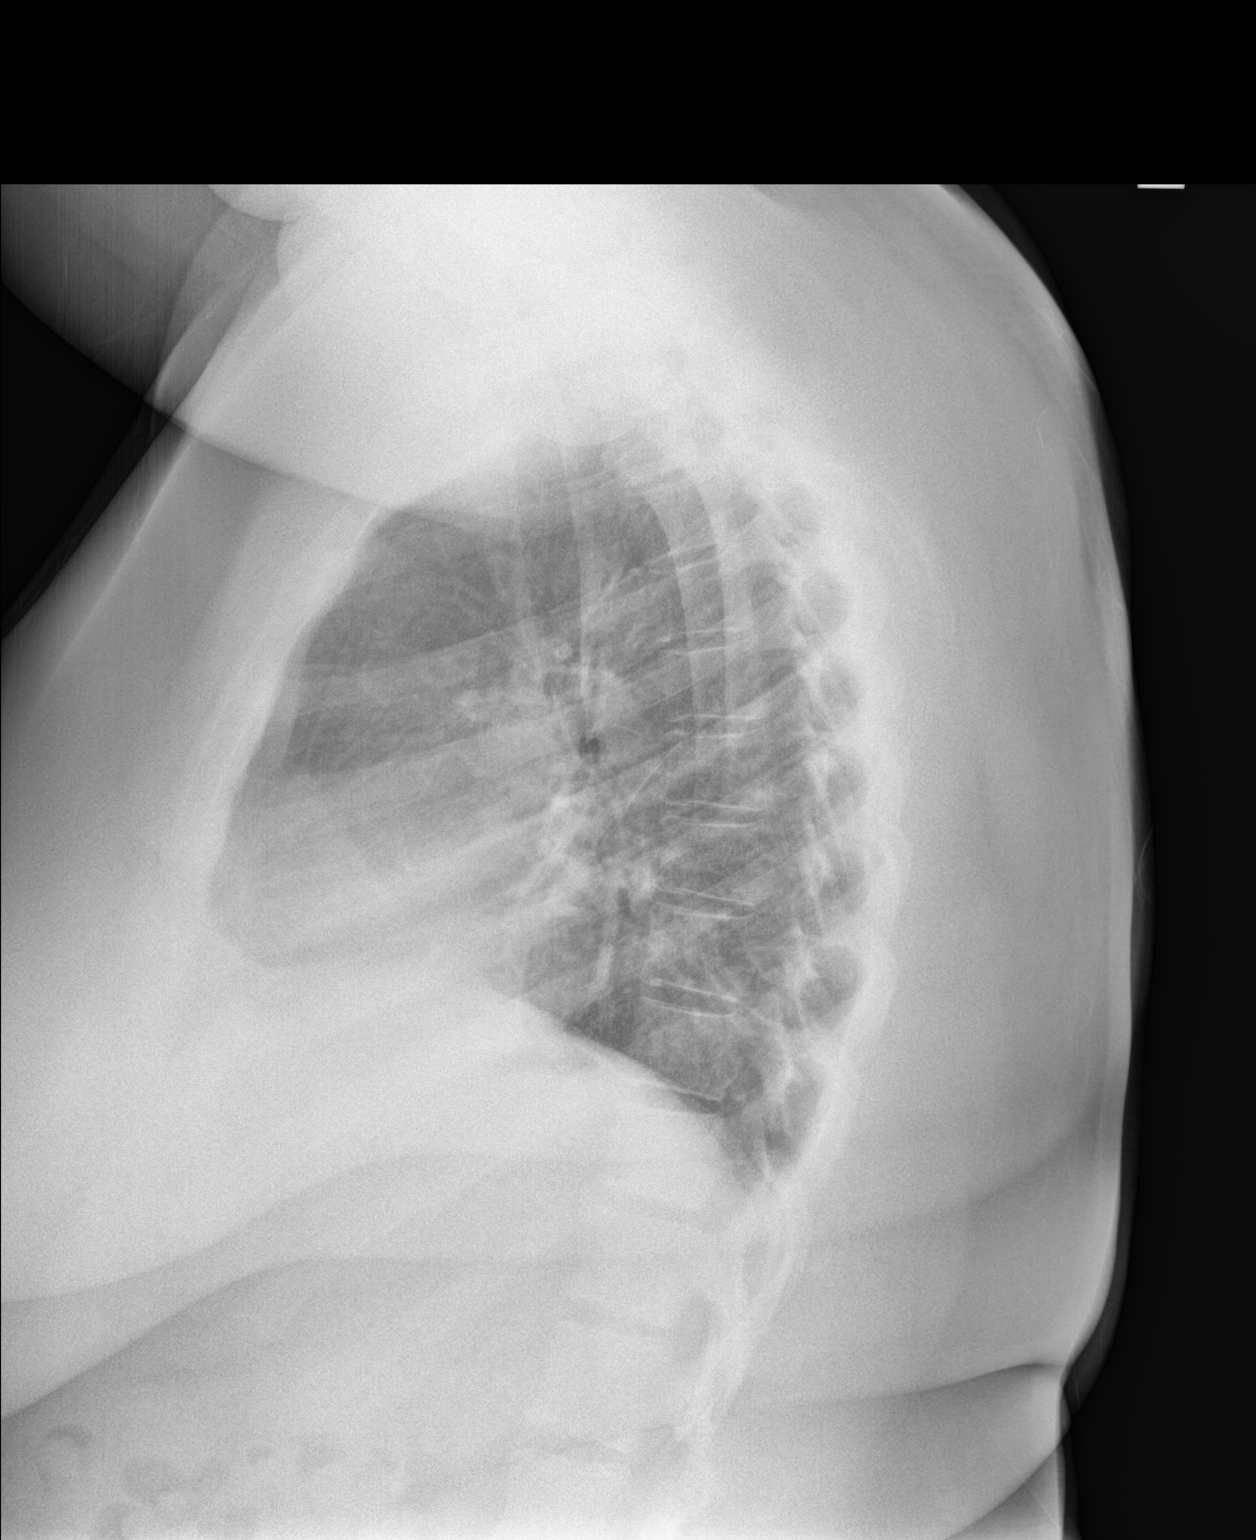

[chest pa]
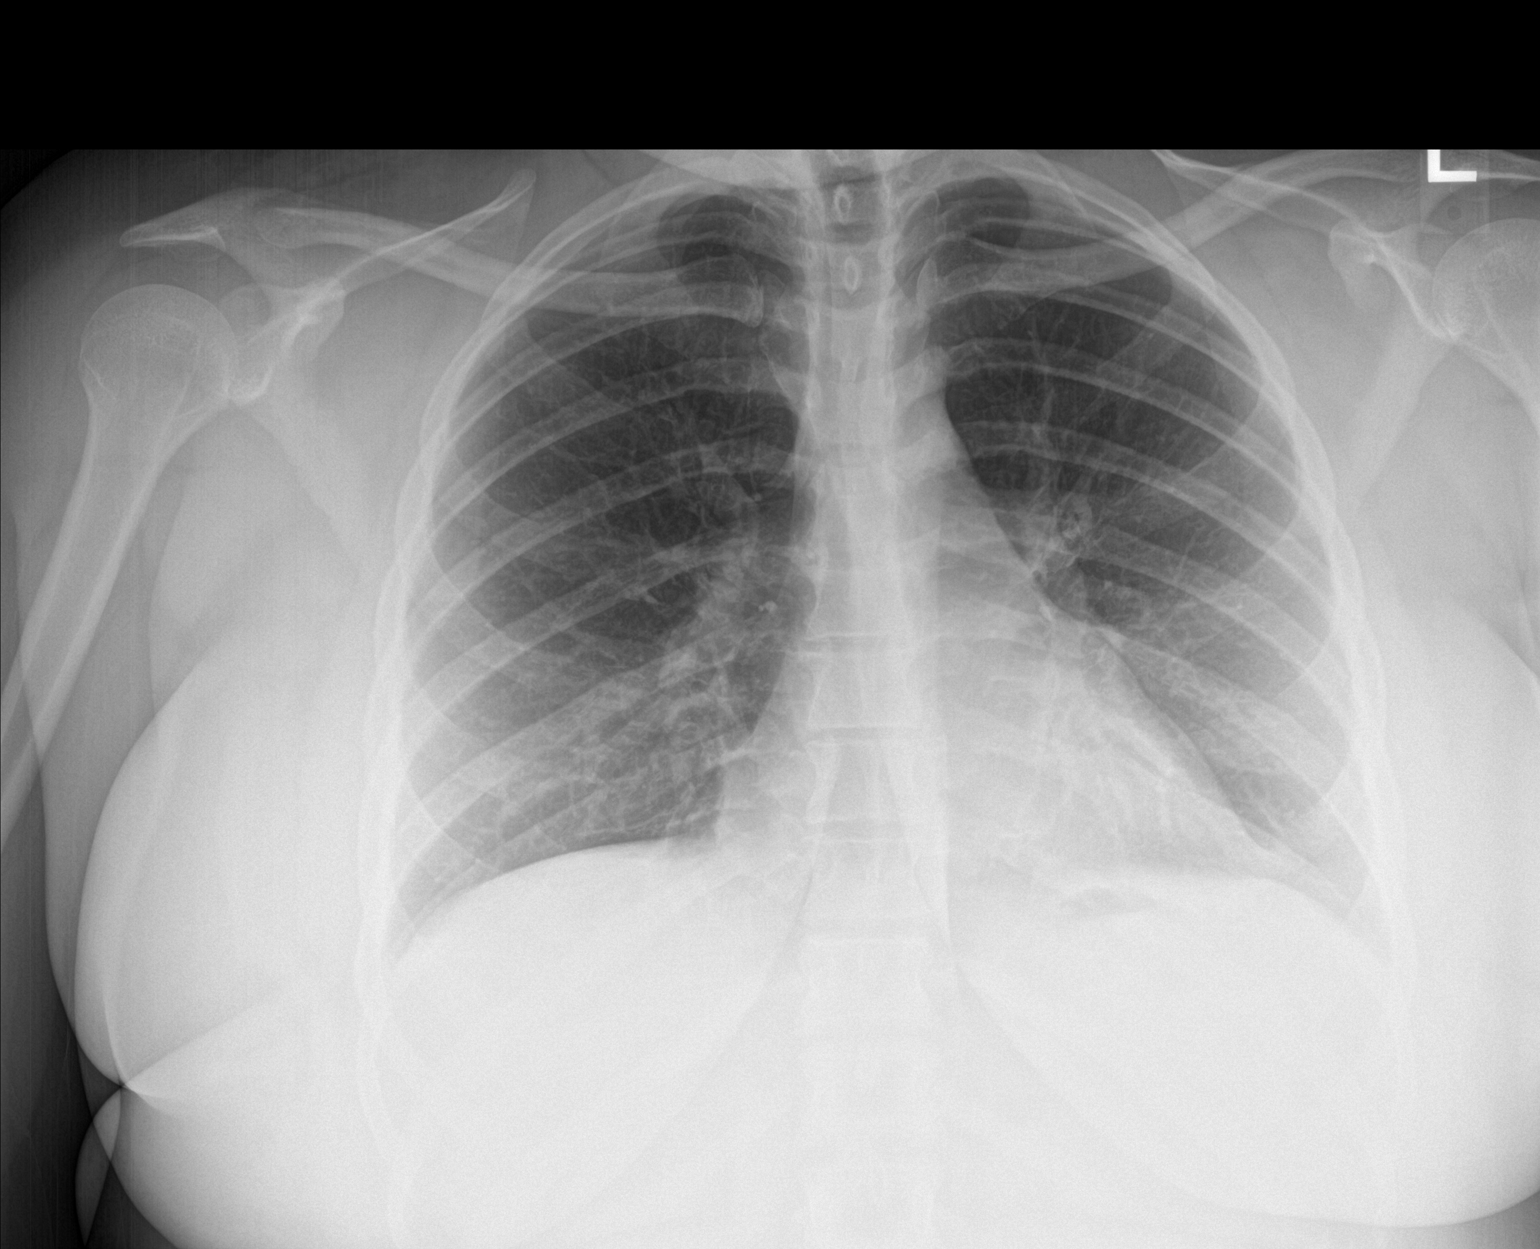

[2 of 2 positions shown; findings below may reference images not displayed]

FINDINGS: Lungs are clear. Heart size and pulmonary vascularity are normal. No
adenopathy. No bone lesions.
IMPRESSION: Lungs clear.  Heart size normal.

## 2020-05-22 MED ORDER — AMOXICILLIN-POT CLAVULANATE 875-125 MG PO TABS: 1.0000 | ORAL_TABLET | Freq: Two times a day (BID) | ORAL | 0 refills | Status: DC

## 2020-05-22 MED ORDER — FLUTICASONE PROPIONATE 50 MCG/ACT NA SUSP: 2.0000 | Freq: Every day | NASAL | 6 refills | Status: DC

## 2020-05-22 NOTE — Progress Notes (Signed)
Subjective:  Patient ID: Brenda Molina, female    DOB: 07-04-1985  Age: 35 y.o. MRN: [id number]  CC:  Chief Complaint  Patient presents with  . Follow-up    Boil under right arm 12/27. It burst 1/5. Took antibiotics (2 rounds). Still seeping puss. Pt also wants  weight loss assistance. Pt reports a coughing since September (mucous won't come up). Negative COVID test 1/10.   Marland Kitchen Hospitalization Follow-up    Pt was admitted to the hospital on 05/08/2020. PT reports they put stents in her brain. Pt reports since leaving the hospital  she has been improving.    HPI [name] presents for   Multiple concerns above.   L axilla abscess: Present since late December. Spontaneous d/c on 04/23/20. Continued to drain - seen [facility] January 5. Note reviewed.  Treated with doxycycline, warm compresses, culture Proteus mirabilis. Changed to augmentin on 1/9. Last does was 2 days ago.  Wound improved, min discharge still there. No warm compresses.  Using bactorban cream (from hospital) BID. No fever, spreading redness. Overall appears better, just slow to resolve.    Cough: Present since September.  Feels like phlegm deeper in chest, hard to cough up. Dry cough mostly.persistent PND, worse with lying down. Some sinus congestion.  No fever, no dyspnea. No heartburn.   No recent nasal spray. Zyrtec for allergies prior, no recent use.   Weight concerns.  Would like to meet with bariatric clinic. Concern about her current meds and any new meds for weight loss.  Body mass index is 43.58 kg/m.  Wt Readings from Last 3 Encounters:  05/23/20 248 lb 5 oz (112.6 kg)  05/22/20 246 lb (111.6 kg)  05/08/20 240 lb (108.9 kg)   Hospital follow up: History of pulsatile tinnitus, intracranial hypertension. Neuro - Dr. Frances Furbish,  admitted January 12 through January 13, stents placed for high-grade stenosis of the right TS/SS, common carotid.  Notes reviewed with improvement in  pulsatile tinnitus.   Continued on Diamox, with plan on weaning. Leg soreness from procedure improving.  appt with Dr. Frances Furbish tomorrow.  Feels like getting better. Pulsatile tinnitus improved.    History Patient Active Problem List   Diagnosis Date Noted  . Pulsatile tinnitus of right ear 05/08/2020  . Perennial and seasonal allergic rhinitis 04/18/2018  . Cough, persistent 04/18/2018  . History of wheezing 04/18/2018  . 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 Normal vaginal delivery 06/06/2015  . RhD negative 06/05/2015  . Preterm premature rupture of membranes 06/05/2015   Past Medical History:  Diagnosis Date  . Allergy   . Anxiety   . Asthma    seasonal- allergy  . GERD (gastroesophageal reflux disease)    "some times"  . HELLP syndrome    post parteum  . PCOS (polycystic ovarian syndrome)   . Sleep apnea    Past Surgical History:  Procedure Laterality Date  . IR ANGIO INTRA EXTRACRAN SEL COM CAROTID INNOMINATE BILAT MOD SED  04/02/2020  . IR ANGIO INTRA EXTRACRAN SEL COM CAROTID INNOMINATE UNI R MOD SED  05/08/2020  . IR ANGIO VERTEBRAL SEL VERTEBRAL BILAT MOD SED  04/02/2020  . IR CT HEAD LTD  05/08/2020  . IR RADIOLOGIST EVAL & MGMT  02/28/2020  . IR TRANSCATH PLC STENT  INITIAL VEIN  INC ANGIOPLASTY  05/08/2020  . IR VENO/JUGULAR RIGHT  05/08/2020  . RADIOLOGY WITH ANESTHESIA N/A 05/01/2020   Procedure: STENTING;  Surgeon: Julieanne Cotton, MD;  Location: [facility]  OR;  Service: Radiology;  Laterality: N/A;  . RADIOLOGY WITH ANESTHESIA N/A 05/08/2020   Procedure: STENT PLACEMENT;  Surgeon: Julieanne Cotton, MD;  Location: [facility] OR;  Service: Radiology;  Laterality: N/A;  . WISDOM TOOTH EXTRACTION     No Known Allergies Prior to Admission medications   Medication Sig Start Date End Date Taking? Authorizing Provider  acetaminophen (TYLENOL) 500 MG tablet Take 1,000 mg by mouth every 6 (six) hours as needed for moderate pain.   Yes [provider]  acetaZOLAMIDE (DIAMOX) 500  MG capsule Take 2 capsules (1,000 mg total) by mouth 2 (two) times daily. 04/04/20  Yes Huston Foley, MD  ascorbic acid (VITAMIN C) 500 MG tablet Take 500 mg by mouth daily.   Yes [provider]  aspirin 325 MG tablet Take 325 mg by mouth daily.   Yes [provider]  b complex vitamins capsule Take 1 capsule by mouth daily.   Yes [provider]  busPIRone (BUSPAR) 7.5 MG tablet TAKE 1 TABLET BY MOUTH TWICE A DAY 11/03/19  Yes Shade Flood, MD  cetirizine (ZYRTEC) 10 MG tablet Take 10 mg by mouth daily as needed for allergies.   Yes [provider]  clopidogrel (PLAVIX) 75 MG tablet Take 1 tablet (75 mg total) by mouth daily. 04/29/20  Yes Covington, Arman Filter, NP  fluticasone (FLONASE) 50 MCG/ACT nasal spray Place 2 sprays into both nostrils daily. Patient taking differently: Place 2 sprays into both nostrils daily as needed for allergies. 12/09/17  Yes Weber, Dema Severin, PA-C  levonorgestrel (MIRENA, 52 MG,) 20 MCG/24HR IUD 1 each by Intrauterine route once.    Yes [provider]  mupirocin cream (BACTROBAN) 2 % Apply topically 2 (two) times daily. 05/09/20  Yes Bruning, Caryn Bee, PA-C  ondansetron (ZOFRAN) 4 MG tablet Take 1 tablet (4 mg total) by mouth every 8 (eight) hours as needed for nausea or vomiting. 02/20/20  Yes Huston Foley, MD  Polyethylene Glycol 400 (BLINK TEARS OP) Place 1 drop into both eyes daily as needed (dry eyes).   Yes [provider]  sertraline (ZOLOFT) 100 MG tablet TAKE 1 TABLET BY MOUTH DAILY. START 1/2 PILL FOR 1 WEEK., THEN INCREASE TO FULL PILL IF TOLERATED. 11/03/19  Yes Shade Flood, MD  amoxicillin-clavulanate (AUGMENTIN) 875-125 MG tablet Take 1 tablet by mouth 2 (two) times daily. Patient not taking: Reported on 05/22/2020 05/04/20   [provider]   Social History   Socioeconomic History  . Marital status: Single    Spouse name: Not on file  . Number of children: 1  . Years of education: Not on file   . Highest education level: Not on file  Occupational History  . Not on file  Tobacco Use  . Smoking status: Never Smoker  . Smokeless tobacco: Never Used  Vaping Use  . Vaping Use: Never used  Substance and Sexual Activity  . Alcohol use: Not Currently  . Drug use: No  . Sexual activity: Yes    Partners: Male    Birth control/protection: Condom  Other Topics Concern  . 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 Has no children. 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  Constitutional:      General: She is not in acute distress.    Appearance: Normal appearance. She is well-developed and well-nourished. She is obese. She is not toxic-appearing.  HENT:     Head: Normocephalic and atraumatic.  Eyes:     Extraocular Movements: EOM normal.     Conjunctiva/sclera: Conjunctivae normal.     Pupils: Pupils are equal, round, and reactive to light.  Neck:     Vascular: No carotid bruit.  Cardiovascular:     Rate and Rhythm: Normal rate and regular rhythm.     Pulses: Intact distal pulses.     Heart sounds: Normal heart sounds.  Pulmonary:     Effort: Pulmonary effort is normal. No respiratory distress.     Breath sounds: Normal breath sounds. No wheezing or rales.  Abdominal:     Palpations: Abdomen is soft. There is no pulsatile mass.     Tenderness: There is no abdominal tenderness.   Skin:    General: Skin is warm and dry.     Comments: Right axilla, anterior aspect, approximately 7 to 8 mm wound, slight widening with opening but base approximately 2 to 3 mm down with healthy-appearing granulation tissue.  Minimal yellow discharge at opening.  Minimal induration anteriorly but no fluctuance.  Neurological:     Mental Status: She is alert and oriented to person, place, and time.  Psychiatric:        Mood and Affect: Mood and affect normal.        Behavior: Behavior normal.        36 minutes spent during visit, greater than 50% counseling and assimilation of information, chart review, and discussion of plan.   DG Chest 2 View  Result Date: 05/22/2020 CLINICAL DATA:  Cough EXAM: CHEST - 2 VIEW COMPARISON:  June 09, 2015 FINDINGS: Lungs are clear. Heart size and pulmonary vascularity are normal. No adenopathy. No bone lesions. IMPRESSION: Lungs clear.  Heart size normal. Electronically Signed   By: June 11, 2015 III M.D.   On: 05/22/2020 16:07      Assessment & Plan:  MAKEILA YAMAGUCHI is a 35 y.o. female . Abscess of right axilla - Plan: amoxicillin-clavulanate (AUGMENTIN) 875-125 MG tablet, WOUND CULTURE  -Overall improving, extend Augmentin, wound care discussed with RTC precautions  Cough - Plan: DG Chest 2 View  -Upper airway cough syndrome likely, trial of Flonase, for possible allergic/postnasal drip cause.  RTC precautions.  Chest x-ray reassuring.  Severe obesity (BMI >= 40) (HCC)  -Phone number provided for weight management  Pulsatile tinnitus of right ear Intracranial hypertension  -Improved status post procedure as above, continue follow-up with specialist as planned  Meds ordered this encounter  Medications  . amoxicillin-clavulanate (AUGMENTIN) 875-125 MG tablet    Sig: Take 1 tablet by mouth 2 (two) times daily.    Dispense:  20 tablet    Refill:  0  . fluticasone (FLONASE) 50 MCG/ACT nasal spray    Sig: Place 2 sprays into both  nostrils daily.    Dispense:  16 g  Refill:  6   Patient Instructions   Cough may be related to allergies, postnasal drip, try Flonase nasal spray 1 to 2 sprays per nostril each day.  I will check chest x-ray but expect that to be okay.  Follow-up to discuss cough in the next few weeks if not resolved.  Abscess of right axilla appears to be improving but still open area.  Extend Augmentin for 1 additional week, continue Bactroban ointment to area after cleansing with soap and water at least 2-3 times per day.  Warm compresses prior to cleansing with water 2-3 times per day.  Recheck in 1 week if that area is not healing (with myself or Kelsea), sooner if worse.   Keep follow up with Dr. Frances FurbishAthar tomorrow.   To discuss weight loss:  [facility]  . [phone]   Cough, Adult Coughing is a reflex that clears your throat and your airways (respiratory system). Coughing helps to heal and protect your lungs. It is normal to cough occasionally, but a cough that happens with other symptoms or lasts a long time may be a sign of a condition that needs treatment. An acute cough may only last 2-3 weeks, while a chronic cough may last 8 or more weeks. Coughing is commonly caused by:  Infection of the respiratory systemby viruses or bacteria.  Breathing in substances that irritate your lungs.  Allergies.  Asthma.  Mucus that runs down the back of your throat (postnasal drip).  Smoking.  Acid backing up from the stomach into the esophagus (gastroesophageal reflux).  Certain medicines.  Chronic lung problems.  Other medical conditions such as heart failure or a blood clot in the lung (pulmonary embolism). Follow these instructions at home: Medicines  Take over-the-counter and prescription medicines only as told by your health care provider.  Talk with your health care provider before you take a cough suppressant  medicine. Lifestyle  Avoid cigarette smoke. Do not use any products that contain nicotine or tobacco, such as cigarettes, e-cigarettes, and chewing tobacco. If you need help quitting, ask your health care provider.  Drink enough fluid to keep your urine pale yellow.  Avoid caffeine.  Do not drink alcohol if your health care provider tells you not to drink.   General instructions  Pay close attention to changes in your cough. Tell your health care provider about them.  Always cover your mouth when you cough.  Avoid things that make you cough, such as perfume, candles, cleaning products, or campfire or tobacco smoke.  If the air is dry, use a cool mist vaporizer or humidifier in your bedroom or your home to help loosen secretions.  If your cough is worse at night, try to sleep in a semi-upright position.  Rest as needed.  Keep all follow-up visits as told by your health care provider. This is important.   Contact a health care provider if you:  Have new symptoms.  Cough up pus.  Have a cough that does not get better after 2-3 weeks or gets worse.  Cannot control your cough with cough suppressant medicines and you are losing sleep.  Have pain that gets worse or pain that is not helped with medicine.  Have a fever.  Have unexplained weight loss.  Have night sweats. Get help right away if:  You cough up blood.  You have difficulty breathing.  Your heartbeat is very fast. These symptoms may represent a serious problem that is an emergency. Do  not wait to see if the symptoms will go away. Get medical help right away. Call your local emergency services (911 in the [place] Do not drive yourself to the hospital. Summary  Coughing is a reflex that clears your throat and your airways. It is normal to cough occasionally, but a cough that happens with other symptoms or lasts a long time may be a sign of a condition that needs treatment.  Take over-the-counter and prescription  medicines only as told by your health care provider.  Always cover your mouth when you cough.  Contact a health care provider if you have new symptoms or a cough that does not get better after 2-3 weeks or gets worse. This information is not intended to replace advice given to you by your health care provider. Make sure you discuss any questions you have with your health care provider. Document Revised: 05/02/2018 Document Reviewed: 05/02/2018 Elsevier Patient Education  2021 [employer].    If you have lab work done today you will be contacted with your lab results within the next 2 weeks.  If you have not heard from [place] then please contact us. The fastest way to get your results is to register for My Chart.   IF you received an x-ray today, you will receive an invoice from [facility]. Please contact [facility] at [phone] with questions or concerns regarding your invoice.   IF you received labwork today, you will receive an invoice from [facility]. Please contact [facility] at [phone] with questions or concerns regarding your invoice.   Our billing staff will not be able to assist you with questions regarding bills from these companies.  You will be contacted with the lab results as soon as they are available. The fastest way to get your results is to activate your My Chart account. Instructions are located on the last page of this paperwork. If you have not heard from [place] regarding the results in 2 weeks, please contact this office.         Signed, Meredith Staggers, MD [facility]

## 2020-05-22 NOTE — Patient Instructions (Addendum)
Cough may be related to allergies, postnasal drip, try Flonase nasal spray 1 to 2 sprays per nostril each day.  I will check chest x-ray but expect that to be okay.  Follow-up to discuss cough in the next few weeks if not resolved.  Abscess of right axilla appears to be improving but still open area.  Extend Augmentin for 1 additional week, continue Bactroban ointment to area after cleansing with soap and water at least 2-3 times per day.  Warm compresses prior to cleansing with water 2-3 times per day.  Recheck in 1 week if that area is not healing (with myself or Kelsea), sooner if worse.   Keep follow up with Dr. Frances Furbish tomorrow.   To discuss weight loss:  [facility]  . [phone]   Cough, Adult Coughing is a reflex that clears your throat and your airways (respiratory system). Coughing helps to heal and protect your lungs. It is normal to cough occasionally, but a cough that happens with other symptoms or lasts a long time may be a sign of a condition that needs treatment. An acute cough may only last 2-3 weeks, while a chronic cough may last 8 or more weeks. Coughing is commonly caused by:  Infection of the respiratory systemby viruses or bacteria.  Breathing in substances that irritate your lungs.  Allergies.  Asthma.  Mucus that runs down the back of your throat (postnasal drip).  Smoking.  Acid backing up from the stomach into the esophagus (gastroesophageal reflux).  Certain medicines.  Chronic lung problems.  Other medical conditions such as heart failure or a blood clot in the lung (pulmonary embolism). Follow these instructions at home: Medicines  Take over-the-counter and prescription medicines only as told by your health care provider.  Talk with your health care provider before you take a cough suppressant medicine. Lifestyle  Avoid cigarette smoke. Do not use any products that contain nicotine or tobacco,  such as cigarettes, e-cigarettes, and chewing tobacco. If you need help quitting, ask your health care provider.  Drink enough fluid to keep your urine pale yellow.  Avoid caffeine.  Do not drink alcohol if your health care provider tells you not to drink.   General instructions  Pay close attention to changes in your cough. Tell your health care provider about them.  Always cover your mouth when you cough.  Avoid things that make you cough, such as perfume, candles, cleaning products, or campfire or tobacco smoke.  If the air is dry, use a cool mist vaporizer or humidifier in your bedroom or your home to help loosen secretions.  If your cough is worse at night, try to sleep in a semi-upright position.  Rest as needed.  Keep all follow-up visits as told by your health care provider. This is important.   Contact a health care provider if you:  Have new symptoms.  Cough up pus.  Have a cough that does not get better after 2-3 weeks or gets worse.  Cannot control your cough with cough suppressant medicines and you are losing sleep.  Have pain that gets worse or pain that is not helped with medicine.  Have a fever.  Have unexplained weight loss.  Have night sweats. Get help right away if:  You cough up blood.  You have difficulty breathing.  Your heartbeat is very fast. These symptoms may represent a serious problem that is an emergency. Do not wait to see if the symptoms will go  away. Get medical help right away. Call your local emergency services (911 in the [place] Do not drive yourself to the hospital. Summary  Coughing is a reflex that clears your throat and your airways. It is normal to cough occasionally, but a cough that happens with other symptoms or lasts a long time may be a sign of a condition that needs treatment.  Take over-the-counter and prescription medicines only as told by your health care provider.  Always cover your mouth when you cough.  Contact  a health care provider if you have new symptoms or a cough that does not get better after 2-3 weeks or gets worse. This information is not intended to replace advice given to you by your health care provider. Make sure you discuss any questions you have with your health care provider. Document Revised: 05/02/2018 Document Reviewed: 05/02/2018 Elsevier Patient Education  2021 [employer].    If you have lab work done today you will be contacted with your lab results within the next 2 weeks.  If you have not heard from [place] then please contact us. The fastest way to get your results is to register for My Chart.   IF you received an x-ray today, you will receive an invoice from [facility]. Please contact [facility] at [phone] with questions or concerns regarding your invoice.   IF you received labwork today, you will receive an invoice from [facility]. Please contact [facility] at [phone] with questions or concerns regarding your invoice.   Our billing staff will not be able to assist you with questions regarding bills from these companies.  You will be contacted with the lab results as soon as they are available. The fastest way to get your results is to activate your My Chart account. Instructions are located on the last page of this paperwork. If you have not heard from [place] regarding the results in 2 weeks, please contact this office.

## 2020-05-23 ENCOUNTER — Ambulatory Visit (INDEPENDENT_AMBULATORY_CARE_PROVIDER_SITE_OTHER): Payer: No Typology Code available for payment source | Admitting: Neurology

## 2020-05-23 ENCOUNTER — Encounter: Payer: Self-pay | Admitting: Neurology

## 2020-05-23 VITALS — BP 122/84 | HR 106 | Ht 63.0 in | Wt 248.3 lb

## 2020-05-23 DIAGNOSIS — R519 Headache, unspecified: Secondary | ICD-10-CM

## 2020-05-23 DIAGNOSIS — H538 Other visual disturbances: Secondary | ICD-10-CM | POA: Diagnosis not present

## 2020-05-23 DIAGNOSIS — G932 Benign intracranial hypertension: Secondary | ICD-10-CM | POA: Diagnosis not present

## 2020-05-23 DIAGNOSIS — H4711 Papilledema associated with increased intracranial pressure: Secondary | ICD-10-CM

## 2020-05-23 DIAGNOSIS — G4733 Obstructive sleep apnea (adult) (pediatric): Secondary | ICD-10-CM | POA: Diagnosis not present

## 2020-05-23 NOTE — Patient Instructions (Addendum)
It was nice to see you again today.  I am glad that you did well with the stent placement recently.  As discussed, for now, please continue with the Diamox 1000 mg twice daily.  Please keep your appointment with your ophthalmologist coming up on 06/06/2020 and your follow-up with interventional radiology for recheck after stent placement on Monday.  Please continue to work on weight loss and talk to Dr. Neva Seat about a referral to a medical weight loss clinic, as you just talk to him yesterday.  I do not see where he has placed a referral yet.  As discussed, you will have mild obstructive sleep apnea by home sleep testing recently.  Even though your sleep apnea is mild, I do believe you may benefit from getting an AutoPap machine.  Please check back with your durable medical equipment company about a mask fit appointment and the status of your AutoPap machine.  Follow-up routinely in 3 months.  Please asked her ophthalmologist to send his note in copy to our clinic as well.  Your prescription for Diamox should be up-to-date.

## 2020-05-23 NOTE — Progress Notes (Signed)
Subjective:    Patient ID: Brenda Molina is a 35 y.o. female.  HPI     Interim history:    Ms. Cannady is a 35 year old right-handed woman with an underlying medical history of PCOS, anxiety, allergies, and severe obesity with a BMI of over 64, who presents for follow-up consultation of her idiopathic intracranial hypertension. The patient is unaccompanied today. I last saw her on 03/19/20, At which time we talked about her test results.  She was advised to increase her Diamox 500 mg in the morning and 1000 mg at night.  She had recently undergone a home sleep test.  She was supposed to have a cerebral angiogram in December 2021 and a subsequent follow-up with her ophthalmologist also in December 2021.    Her home sleep test from 03/18/2020 showed overall mild obstructive sleep apnea with an AHI of 7.8/h, O2 nadir of 84%.  Given her medical history, she was advised to start AutoPap therapy.  She had consultation with Dr. Estanislado Pandy in the interim and had a cerebral angiogram on 04/02/2020, I reviewed the results:   IMPRESSION: Bilateral severe decrease in caliber of the distal right transverse sinus/sigmoid sinus junction, and of the left distal transverse sinus/sigmoid sinus junction suggestive of high-grade stenosis. Endovascular procedure was advised.  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  The patient was therefore advised to increase her Diamox to 1000 mg twice daily per phone call.  She subsequently had a stent placement in the right transverse sinus and right proximal sigmoid sinus.  Today, 05/23/2020: She reports doing fairly well, continues to take Diamox 1 g twice daily, she tolerates it fairly well after initial difficulty getting adjusted to the dose.  She has done well after her stent placement, she did have some groin discomfort on the left side.  She has been improving.  She has no new symptoms, does have  recurrent headaches, has been on the wait list for her AutoPap machine.  She had a checkup with her primary care and talk to him about weight management and a referral to weight loss clinic.  She has an appointment with her ophthalmologist on 06/06/2020 and a checkup with interventional radiology post procedure on 05/27/2020.   The patient's allergies, current medications, family history, past medical history, past social history, past surgical history and problem list were reviewed and updated as appropriate.   Previously:    I first met her on 02/20/2020 as a referral from the emergency room. She had been sent to the ER after an abnormal eye examination which revealed bilateral significant papilledema. She had a elevated opening pressure during a lumbar puncture in the emergency room on 02/15/2020. She had also undergone MRI of the brain as well as MR orbits and MRV. She was advised to continue with Diamox, she reported headaches. She was also advised to seek consultation with interventional radiology to investigate bilateral distal transverse sinus stenoses.   She presented to the emergency room again on 02/22/2020 with severe headache. A lumbar puncture in the ER was unsuccessful and she had a fluoroscopic guided lumbar puncture on 02/22/2020 which showed an opening pressure of 55, large-volume tap was done and she had a closing pressure of 18. She felt better. She had an interim evaluation with interventional radiology. She is scheduled early next month for a diagnostic arteriogram to allow evaluation of her venous drainage system and clarification of the suspected distal transverse sinus stenoses.  02/20/20: (She) was sent to the emergency room on 02/15/2020 after an eye examination.  She reported headaches and blurry vision.  Her ophthalmology exam revealed florid bilateral papilledema.  I reviewed emergency room records.  Ophthalmology records were also scanned in from her visit to [facility].  In the emergency room she had multiple scans including brain MRI with and without contrast, MR orbits with and without contrast and MRV head with and without contrast.  I reviewed the results: IMPRESSION: 1. Technically limited exam due to extensive susceptibility artifact related to dental hardware. 2. Subtle bulging of the optic discs at the posterior aspects of both globes, left more prominent than right, consistent with papilledema. 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 Constellation of findings can be seen in the setting of idiopathic intracranial hypertension. Correlation with LP and opening pressures could be performed for further evaluation as warranted. 3. Otherwise normal MRI of the brain and orbits. No other acute abnormality identified. No definite evidence for acute optic neuritis. 4. Otherwise normal intracranial MRV. No evidence for dural sinus thrombosis.   He also had a lumbar puncture in the emergency room with an opening pressure elevated at 42.  Routine laboratory results were obtained including routine tests on her CSF which initially was blood-tinged with an RBC count of 973, WBC count of 7 with subsequent improvement in tube 4.  Protein in the CSF was 18, glucose was 67.   She was started on Diamox in the ER.   She reports that she has noticed nausea since starting the Diamox.  She is taking 500 mg twice daily.  She had occasional retching and brought up mucus.  She had improvement in her headache but now she has a pressure sensation and still has blurry vision and light sensitivity.  She has not noticed any new symptoms.  She has not made a follow-up appointment yet with her ophthalmologist, Dr. Tama High.  She was told to follow-up in 1 month.  She does not have any medication for nausea. She reports some snoring and sleep disruption and feels tired during the day.  He has never had a sleep study.  She lives with her  69-year-old daughter.  Her Epworth sleepiness score is 2 out of 24, fatigue severity score is 51 out of 63.  She goes to bed around 830 and rise time is between 6:15 AM and 7 AM.   She went to urgent care in early October around the eighth and was told she had a sinus and ear infection was treated with antibiotic treatment.  She went back to urgent care about a week later and was given a prescription for prednisone.  Her Past Medical History Is Significant For: Past Medical History:  Diagnosis Date  . Allergy   . Anxiety   . Asthma    seasonal- allergy  . GERD (gastroesophageal reflux disease)    "some times"  . HELLP syndrome    post parteum  . PCOS (polycystic ovarian syndrome)   . Sleep apnea     Her Past Surgical History Is Significant For: Past Surgical History:  Procedure Laterality Date  . IR ANGIO INTRA EXTRACRAN SEL COM CAROTID INNOMINATE BILAT MOD SED  04/02/2020  . IR ANGIO INTRA EXTRACRAN SEL COM CAROTID INNOMINATE UNI R MOD SED  05/08/2020  . IR ANGIO VERTEBRAL SEL VERTEBRAL BILAT MOD SED  04/02/2020  . IR CT HEAD LTD  05/08/2020  . IR RADIOLOGIST EVAL & MGMT  02/28/2020  . IR TRANSCATH PLC STENT  INITIAL VEIN  INC ANGIOPLASTY  05/08/2020  . IR VENO/JUGULAR RIGHT  05/08/2020  . RADIOLOGY WITH ANESTHESIA N/A 05/01/2020   Procedure: STENTING;  Surgeon: Luanne Bras, MD;  Location: [facility];  Service: Radiology;  Laterality: N/A;  . RADIOLOGY WITH ANESTHESIA N/A 05/08/2020   Procedure: STENT PLACEMENT;  Surgeon: Luanne Bras, MD;  Location: [employer] [facility];  Service: Radiology;  Laterality: N/A;  . WISDOM TOOTH EXTRACTION      Her Family History Is Significant For: Family History  Problem Relation Age of Onset  . Asthma Father   . Hypertension Father     Her Social History Is Significant For: Social History   Socioeconomic History  . Marital status: Single    Spouse name: Not on file  . Number of children: 1  . Years of education: Not on file  . Highest education  level: Not on file  Occupational History  . Not on file  Tobacco Use  . Smoking status: Never Smoker  . Smokeless tobacco: Never Used  Vaping Use  . Vaping Use: Never used  Substance and Sexual Activity  . Alcohol use: Not Currently  . Drug use: No  . Sexual activity: Yes    Partners: Male    Birth control/protection: Condom  Other Topics Concern  . 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 acetaminophen (TYLENOL) 500 MG tablet Take 1,000 mg by mouth every 6 (six) hours as needed for moderate pain.  Marland Kitchen acetaZOLAMIDE (DIAMOX) 500 MG capsule Take 2 capsules (1,000 mg total) by mouth 2 (two) times daily.  Marland Kitchen amoxicillin-clavulanate (AUGMENTIN) 875-125 MG tablet Take 1 tablet by mouth 2 (two) times daily.  Marland Kitchen ascorbic acid (VITAMIN C) 500 MG tablet Take 500 mg by mouth daily.  Marland Kitchen aspirin 325 MG tablet Take 325 mg by mouth daily.  Marland Kitchen b complex vitamins capsule Take 1 capsule by mouth daily.  . busPIRone (BUSPAR) 7.5 MG tablet TAKE 1 TABLET BY MOUTH TWICE A DAY  . cetirizine (ZYRTEC) 10 MG tablet Take 10 mg by mouth daily as needed for allergies.  Marland Kitchen clopidogrel (PLAVIX) 75 MG tablet Take 1 tablet (75 mg total) by mouth daily.  . fluticasone (FLONASE) 50 MCG/ACT nasal spray Place 2 sprays into both nostrils daily.  Marland Kitchen levonorgestrel (MIRENA, 52 MG,) 20 MCG/24HR IUD 1 each by Intrauterine route once.   . mupirocin cream (BACTROBAN) 2 % Apply topically 2 (two) times daily.  .  ondansetron (ZOFRAN) 4 MG tablet Take 1 tablet (4 mg total) by mouth every 8 (eight) hours as needed for nausea or vomiting.  . Polyethylene Glycol 400 (BLINK TEARS OP) Place 1 drop into both eyes daily as needed (dry eyes).  . sertraline (ZOLOFT) 100 MG tablet TAKE 1 TABLET BY MOUTH DAILY. START 1/2 PILL FOR 1 WEEK., THEN INCREASE TO FULL PILL IF TOLERATED.   No facility-administered encounter medications on  file as of 05/23/2020.  :  Review of Systems:  Out of a complete 14 point review of systems, all are reviewed and negative with the exception of these symptoms as listed below: Review of Systems  Neurological:       Here for 3 month f/u. Reports she has been doing fairly well since her last visit. She reports her shunt procedure went well. She called DME for auto pap today and is on the wait list for the machine. Diamox is up to 100 mg twice a day, she is tolerating well now had some nausea at the beginning. Pt reports her headache frequency/severity has improved. Still some light sensitivy noted.  She has a f/u with eye doc on 06/06/2020.     Objective:  Neurological Exam  Physical Exam Physical Examination:   Vitals:   05/23/20 1334  BP: 122/84  Pulse: (!) 106  SpO2: 96%    General Examination: The patient is a very pleasant 35 y.o. female in no acute distress. She appears well-developed and well-nourished and well groomed.   HEENT:Normocephalic, atraumatic, pupils are equal, round and reactive to light, she is less sensitive to light.  She does not have any significant increase in eye blinking, funduscopic exam is difficult.  She reports better vision with her glasses on and still some blurry vision in the periphery. Extraocular tracking is well preserved, face is symmetric with normal facial animation and normal facial sensation, hearing grossly intact.  Speech is clear with no dysarthria noted. There is no hypophonia. There is no lip, neck/head, jaw or voice tremor. Neck is  supple with full range of passive and active motion. There are no carotid bruits on auscultation. Oropharynx exam reveals:mildmouth dryness, gooddental hygiene and moderateairway crowding. Tongue protrudes centrally in palate elevates symmetrically.  Chest:Clear to auscultation without wheezing, rhonchi or crackles noted.  Heart:S1+S2+0, regular and normal without murmurs, rubs or gallops noted.   Abdomen:Soft, non-tender and non-distended with normal bowel sounds appreciated on auscultation.  Extremities:There isnopitting edema in the distal lower extremities bilaterally.   Skin: Warm and dry without trophic changes noted.  Musculoskeletal: exam reveals no obvious joint deformities, tenderness or joint swelling or erythema.   Neurologically:  Mental status: The patient is awake, alert and oriented in all 4 spheres.Herimmediate and remote memory, attention, language skills and fund of knowledge are appropriate. There is no evidence of aphasia, agnosia, apraxia or anomia. Speech is clear with normal prosody and enunciation. Thought process is linear. Mood is normaland affect is normal.  Cranial nerves II - XII are as described above under HEENT exam. In addition: shoulder shrug is normal with equal shoulder height noted. Motor exam: Normal bulk, strength and tone is noted. There is no drift, tremor or rebound. Romberg is negative. Reflexes are 1+ throughout. Fine motor skills and coordination: intact with normal finger taps, normal hand movements, normal rapid alternating patting, normal foot taps and normal foot agility.  Cerebellar testing: No dysmetria or intention tremor on finger to nose testing. Heel to shin is unremarkable bilaterally. There is no truncal or gait ataxia.  Sensory exam: intact to light touch in the upper and lower extremities. Gait, station and balance:Shestands easily. No veering to one side is noted. No leaning to one side is noted. Posture is  age-appropriate and stance is narrow based. Gait showsnormalstride length and normalpace. No problems turning are noted. Tandem walk is unremarkable.   Assessmentand Plan:   In summary,Thaily D Bruceis a very pleasant 41 year oldfemalewith an  underlying medical history of PCOS, anxiety, allergies, and severe obesity with a BMI of over 40, who presents for follow-up consultation of her idiopathic intracranial hypertension after being diagnosed with bilateral florid papilledema by her ophthalmologist in October 2021.  She went back to the ER on 02/22/2020 and had a large-volume tap with an opening pressure highly elevated at 55 cm.  She felt better after the lumbar puncture.  She had been on Diamox 500 mg twice daily since October 2021.  She was advised to increase this to 1 pill in the morning and 2 at bedtime in November 2021 and in December after her checkup with the ophthalmologist she was encouraged to increase the Diamox to 1000 mg twice daily as her papilledema had become worse.  She had an interim home sleep test that showed mild obstructive sleep apnea.  She is on the wait list for an AutoPap machine as there is a shortage on new machines.  She is willing to try AutoPap therapy.  Furthermore, she had interim consultation with interventional radiology and recently had a stent placed in the right transverse and sigmoid sinuses without complications.  She has a follow-up with them post procedure on 05/27/2020 and a checkup with the ophthalmologist on 06/06/2020.  If all goes well, we can consider gently reducing the Diamox especially if her eye exam is better with the papilledema decreased.  She is advised to talk to her primary care physician about a referral for medical weight management. At the time of her first lumbar puncture in October 2021, her opening pressure was 42 cm. She had several imaging tests through the ER at the time including MRI brain and orbits and MRV of the head.  She is  advised to follow-up in this clinic in 3 months, sooner if needed.  I answered all her questions today and she was in agreement.  I spent 30 minutes in total face-to-face time and in reviewing records during pre-charting, more than 50% of which was spent in counseling and coordination of care, reviewing test results, reviewing medications and treatment regimen and/or in discussing or reviewing the diagnosis of IIH, the prognosis and treatment options. Pertinent laboratory and imaging test results that were available during this visit with the patient were reviewed by me and considered in my medical decision making (see chart for details).

## 2020-05-24 ENCOUNTER — Other Ambulatory Visit: Payer: Self-pay | Admitting: Family Medicine

## 2020-05-24 DIAGNOSIS — F418 Other specified anxiety disorders: Secondary | ICD-10-CM

## 2020-05-24 DIAGNOSIS — F32A Depression, unspecified: Secondary | ICD-10-CM

## 2020-05-24 MED ORDER — SERTRALINE HCL 100 MG PO TABS: 100.0000 mg | ORAL_TABLET | Freq: Every day | ORAL | 0 refills | Status: DC

## 2020-05-24 MED ORDER — BUSPIRONE HCL 7.5 MG PO TABS: 7.5000 mg | ORAL_TABLET | Freq: Two times a day (BID) | ORAL | 0 refills | Status: DC

## 2020-05-24 NOTE — Telephone Encounter (Signed)
No refills without an office visit. Please schedule appt soon

## 2020-05-24 NOTE — Telephone Encounter (Signed)
05/24/2020 - PATIENT REQUESTING REFILLS ON HER BUSPIRONE 7.5 mg AND SERTRALINE 100 mg. I HAVE SCHEDULED HER FOR A MEDICATION REVIEW WITH DR. Neva Seat ON Friday 06/21/2020 AT 3:20 pm. I WILL ROUTE MESSAGE BACK TO THE CLINICAL TEAM FOR A COURTESY REFILL UNTIL HER APPOINTMENT. MBC

## 2020-05-25 LAB — WOUND CULTURE

## 2020-05-27 ENCOUNTER — Other Ambulatory Visit: Payer: Self-pay

## 2020-05-27 ENCOUNTER — Ambulatory Visit (HOSPITAL_COMMUNITY)
Admission: RE | Admit: 2020-05-27 | Discharge: 2020-05-27 | Disposition: A | Discharge status: Home / Self Care | Payer: No Typology Code available for payment source | Source: Ambulatory Visit | Attending: Radiology | Admitting: Radiology

## 2020-05-27 DIAGNOSIS — Z9889 Other specified postprocedural states: Secondary | ICD-10-CM | POA: Diagnosis not present

## 2020-05-27 DIAGNOSIS — I676 Nonpyogenic thrombosis of intracranial venous system: Secondary | ICD-10-CM | POA: Diagnosis not present

## 2020-05-27 DIAGNOSIS — H93A1 Pulsatile tinnitus, right ear: Secondary | ICD-10-CM

## 2020-05-29 HISTORY — PX: IR RADIOLOGIST EVAL & MGMT: IMG5224

## 2020-06-03 DIAGNOSIS — R69 Illness, unspecified: Secondary | ICD-10-CM | POA: Diagnosis not present

## 2020-06-05 ENCOUNTER — Telehealth: Payer: Self-pay | Admitting: Neurology

## 2020-06-05 NOTE — Telephone Encounter (Signed)
Left a voice mail for Pt. To call back to pay for her short-term disability form to be filled out

## 2020-06-06 DIAGNOSIS — G932 Benign intracranial hypertension: Secondary | ICD-10-CM | POA: Diagnosis not present

## 2020-06-11 NOTE — Addendum Note (Signed)
Encounter addended by: Herbie Baltimore on: 06/11/2020 1:40 PM  Actions taken: Imaging Exam ended

## 2020-06-12 ENCOUNTER — Telehealth: Payer: Self-pay | Admitting: Neurology

## 2020-06-12 NOTE — Telephone Encounter (Signed)
I received the office note from Dr. Georges Mouse, ophthalmologist from her visit from 06/06/2020.  She was noted to have bilateral disc edema which is improving.  She was encouraged to continue with her Diamox and return to clinic for repeat ophthalmologic examination and reassessment in 3 months.  Visual fields were noted to be stable with mild arcuates.   FYI, nothing further needed.

## 2020-06-21 ENCOUNTER — Ambulatory Visit (INDEPENDENT_AMBULATORY_CARE_PROVIDER_SITE_OTHER): Payer: No Typology Code available for payment source | Admitting: Family Medicine

## 2020-06-21 ENCOUNTER — Encounter: Payer: Self-pay | Admitting: Family Medicine

## 2020-06-21 ENCOUNTER — Other Ambulatory Visit: Payer: Self-pay

## 2020-06-21 VITALS — BP 136/80 | HR 94 | Temp 98.5°F | Resp 15 | Ht 63.0 in | Wt 241.8 lb

## 2020-06-21 DIAGNOSIS — L02411 Cutaneous abscess of right axilla: Secondary | ICD-10-CM

## 2020-06-21 DIAGNOSIS — R69 Illness, unspecified: Secondary | ICD-10-CM | POA: Diagnosis not present

## 2020-06-21 DIAGNOSIS — R739 Hyperglycemia, unspecified: Secondary | ICD-10-CM

## 2020-06-21 DIAGNOSIS — F32A Depression, unspecified: Secondary | ICD-10-CM

## 2020-06-21 DIAGNOSIS — E876 Hypokalemia: Secondary | ICD-10-CM

## 2020-06-21 DIAGNOSIS — F418 Other specified anxiety disorders: Secondary | ICD-10-CM

## 2020-06-21 DIAGNOSIS — R059 Cough, unspecified: Secondary | ICD-10-CM | POA: Diagnosis not present

## 2020-06-21 MED ORDER — DOXYCYCLINE HYCLATE 100 MG PO TABS: 100.0000 mg | ORAL_TABLET | Freq: Two times a day (BID) | ORAL | 0 refills | Status: DC

## 2020-06-21 MED ORDER — BUSPIRONE HCL 7.5 MG PO TABS: 7.5000 mg | ORAL_TABLET | Freq: Two times a day (BID) | ORAL | 1 refills | Status: DC

## 2020-06-21 MED ORDER — SERTRALINE HCL 100 MG PO TABS: 100.0000 mg | ORAL_TABLET | Freq: Every day | ORAL | 1 refills | Status: DC

## 2020-06-21 NOTE — Progress Notes (Signed)
Subjective:  Patient ID: Brenda Molina, female    DOB: 02-05-1986  Age: 35 y.o. MRN: [id number]  CC:  Chief Complaint  Patient presents with  . Depression    Pt here to review medications so she can continue refills depression is her main cause for medicaions at this time     HPI Brenda Molina presents for   Depression: With anxiety, last discussed in November 2020.  Doing well at 100 mg dose of Zoloft at that time.  BuSpar 7.5 mg twice daily.  has also met with therapist, and hydroxyzine in the past for sleep/anxiety. Taking buspra BID and zoloft daily. No new side effects recently.  Exercising as below. Still meeting with counselor.  Mood symptoms are getting better.  Depression screen [facility] 2/9 06/21/2020 05/22/2020 03/02/2019 02/22/2019 02/06/2019  Decreased Interest 1 0 0 2 3  Down, Depressed, Hopeless 1 0 0 2 3  PHQ - 2 Score 2 0 0 4 6  Altered sleeping 1 - - 2 3  Tired, decreased energy 2 - - 1 3  Change in appetite 1 - - 2 2  Feeling bad or failure about yourself  0 - - 1 3  Trouble concentrating 1 - - 1 3  Moving slowly or fidgety/restless 0 - - 0 2  Suicidal thoughts 0 - - 1 1  PHQ-9 Score 7 - - 12 23  Difficult doing work/chores - - - - -   GAD 7 : Generalized Anxiety Score 06/21/2020 02/22/2019 02/06/2019 12/29/2017  Nervous, Anxious, on Edge 1 2 2  0  Control/stop worrying 1 2 3 1   Worry too much - different things 1 2 3 1   Trouble relaxing 2 2 3 2   Restless 0 1 3 2   Easily annoyed or irritable 1 1 3 3   Afraid - awful might happen 1 2 2  0  Total GAD 7 Score 7 12 19 9   Anxiety Difficulty - - - Not difficult at all   Idiopathic intracranial hypertension Followed by neurology, Dr. Rexene Alberts, last visit January 27. 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  Followed by ophthalmology. AutoPap therapy for OSA, mild with AHI 7.8 with O2 nadir of 84% in November.  Treated with Pap therapy given her medical history.  On waitlist for AutoPap. 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 Estanislado Pandy Neuro follow-up in 3 months.  Option of lower dose of Diamox if papilledema improves.  Obesity Discussed previously including possible evaluation by medical weight loss specialist.  Also discussed at recent neuro visit.  Weight has decreased 7 pounds from January 27. Has not called weight loss specialist yet - walking more for exercise.  Plans to call weight management.  Wt Readings from Last 3 Encounters:  06/21/20 241 lb 12.8 oz (109.7 kg)  05/23/20 248 lb 5 oz (112.6 kg)  05/22/20 246 lb (111.6 kg)   Body mass index is 42.83 kg/m.  Cough: Discussed at January 28 visit, possible upper airway cough syndrome.  Trial of Flonase at that time. Helped cough - resolved.   R axillary abscess: Improved since last visit. Closed then started to open last week no fever. Some yellow- white d/c. Tx: bactroban BID past few days.    Hypokalemia -  3.3 on 1/12. Drinks water, eats bananas. Some cramps at times.   Hyperglycemia.  gluocse 102-124 since 2018.  Lab Results  Component Value Date  HGBA1C 5.5 11/01/2014     History Patient Active Problem List   Diagnosis Date Noted  . Pulsatile tinnitus of right ear 05/08/2020  . Perennial and seasonal allergic rhinitis 04/18/2018  . Cough, persistent 04/18/2018  . History of wheezing 04/18/2018  . 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 Normal vaginal delivery 06/06/2015  . RhD negative 06/05/2015  . Preterm premature rupture of membranes 06/05/2015   Past Medical History:  Diagnosis Date  . Allergy   . Anxiety   . Asthma    seasonal- allergy  . GERD (gastroesophageal reflux disease)    "some times"  . HELLP syndrome    post parteum  . PCOS (polycystic ovarian syndrome)   . Sleep apnea    Past Surgical History:  Procedure Laterality Date  . IR ANGIO INTRA EXTRACRAN SEL COM CAROTID INNOMINATE BILAT MOD SED  04/02/2020   . IR ANGIO INTRA EXTRACRAN SEL COM CAROTID INNOMINATE UNI R MOD SED  05/08/2020  . IR ANGIO VERTEBRAL SEL VERTEBRAL BILAT MOD SED  04/02/2020  . IR CT HEAD LTD  05/08/2020  . IR RADIOLOGIST EVAL & MGMT  02/28/2020  . IR RADIOLOGIST EVAL & MGMT  05/29/2020  . IR TRANSCATH PLC STENT  INITIAL VEIN  INC ANGIOPLASTY  05/08/2020  . IR VENO/JUGULAR RIGHT  05/08/2020  . RADIOLOGY WITH ANESTHESIA N/A 05/01/2020   Procedure: STENTING;  Surgeon: Luanne Bras, MD;  Location: [place];  Service: Radiology;  Laterality: N/A;  . RADIOLOGY WITH ANESTHESIA N/A 05/08/2020   Procedure: STENT PLACEMENT;  Surgeon: Luanne Bras, MD;  Location: [place];  Service: Radiology;  Laterality: N/A;  . WISDOM TOOTH EXTRACTION     No Known Allergies Prior to Admission medications   Medication Sig Start Date End Date Taking? Authorizing Provider  acetaminophen (TYLENOL) 500 MG tablet Take 1,000 mg by mouth every 6 (six) hours as needed for moderate pain.   Yes [provider]  acetaZOLAMIDE (DIAMOX) 500 MG capsule Take 2 capsules (1,000 mg total) by mouth 2 (two) times daily. 04/04/20  Yes Star Age, MD  ascorbic acid (VITAMIN C) 500 MG tablet Take 500 mg by mouth daily.   Yes [provider]  aspirin 325 MG tablet Take 325 mg by mouth daily.   Yes [provider]  b complex vitamins capsule Take 1 capsule by mouth daily.   Yes [provider]  busPIRone (BUSPAR) 7.5 MG tablet Take 1 tablet (7.5 mg total) by mouth 2 (two) times daily. 05/24/20  Yes Wendie Agreste, MD  cetirizine (ZYRTEC) 10 MG tablet Take 10 mg by mouth daily as needed for allergies.   Yes [provider]  clopidogrel (PLAVIX) 75 MG tablet Take 1 tablet (75 mg total) by mouth daily. 04/29/20  Yes Covington, Ardeth Perfect, NP  fluticasone (FLONASE) 50 MCG/ACT nasal spray Place 2 sprays into both nostrils daily. 05/22/20  Yes Wendie Agreste, MD  levonorgestrel (MIRENA, 52 MG,) 20 MCG/24HR IUD 1 each by Intrauterine  route once.    Yes [provider]  mupirocin cream (BACTROBAN) 2 % Apply topically 2 (two) times daily. 05/09/20  Yes Bruning, Lennette Bihari, PA-C  ondansetron (ZOFRAN) 4 MG tablet Take 1 tablet (4 mg total) by mouth every 8 (eight) hours as needed for nausea or vomiting. 02/20/20  Yes Star Age, MD  Polyethylene Glycol 400 (BLINK TEARS OP) Place 1 drop into both eyes daily as needed (dry eyes).   Yes [provider]  sertraline (ZOLOFT) 100 MG tablet Take 1  tablet (100 mg total) by mouth daily. 05/24/20  Yes Wendie Agreste, MD  amoxicillin-clavulanate (AUGMENTIN) 875-125 MG tablet Take 1 tablet by mouth 2 (two) times daily. Patient not taking: Reported on 06/21/2020 05/22/20   Wendie Agreste, MD   Social History   Socioeconomic History  . Marital status: Single    Spouse name: Not on file  . Number of children: 1  . Years of education: Not on file  . Highest education level: Not on file  Occupational History  . Not on file  Tobacco Use  . Smoking status: Never Smoker  . Smokeless tobacco: Never Used  Vaping Use  . Vaping Use: Never used  Substance and Sexual Activity  . Alcohol use: Not Currently  . Drug use: No  . Sexual activity: Yes    Partners: Male    Birth control/protection: Condom  Other Topics Concern  . 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  Constitutional:      General: She is not in acute distress.    Appearance: She is well-developed and well-nourished.  HENT:     Head: Normocephalic and atraumatic.  Cardiovascular:     Rate and Rhythm: Normal rate.  Pulmonary:     Effort: Pulmonary effort is normal.  Skin:    General: Skin is warm and dry.       Neurological:     Mental Status: She is alert and oriented to person, place, and time.  Psychiatric:        Mood and Affect: Mood and affect normal.      30 minutes spent during visit, greater than 50% counseling and assimilation of information, chart review, and discussion of plan.    Assessment & Plan:  Brenda Molina is a 35 y.o. female . Abscess of right axilla - Plan: doxycycline (VIBRA-TABS) 100 MG tablet, WOUND CULTURE  -Improved and reopened recently, restart doxycycline, wound care, RTC precautions. Check culture. Recheck 1 week if proving or sooner if worse.  Depression with anxiety - Plan: sertraline (ZOLOFT) 100 MG tablet, busPIRone (BUSPAR) 7.5 MG tablet Depression, unspecified depression type - Plan: sertraline (ZOLOFT) 100 MG tablet  -Improved, continue Zoloft, BuSpar same doses as well as counseling  Cough  -Resolved with Flonase, upper airway cough syndrome likely. Continue Flonase.  Severe obesity (BMI >= 40) (HCC)  -Commended on weight loss since last visit and new exercise regimen. Still has number for medical weight loss specialist which she plans to contact.  Hypokalemia - Plan: Basic metabolic panel  -Borderline on labs in January, recheck labs  Hyperglycemia - Plan: Hemoglobin A1c  -Check A1c. Commended on attempts at weight loss as above  Meds ordered  this encounter  Medications  . doxycycline (VIBRA-TABS) 100 MG tablet    Sig: Take 1 tablet (100 mg total) by mouth 2 (two) times daily.    Dispense:  14 tablet    Refill:  0  . sertraline (ZOLOFT) 100 MG tablet     Sig: Take 1 tablet (100 mg total) by mouth daily.    Dispense:  90 tablet    Refill:  1  . busPIRone (BUSPAR) 7.5 MG tablet    Sig: Take 1 tablet (7.5 mg total) by mouth 2 (two) times daily.    Dispense:  180 tablet    Refill:  1   Patient Instructions    Glad to hear that cough is better - ok to continue flonase.   Great work on exercise and weigh has improved. It still may be helpful to meet with medical weight loss specialist.   No change in zoloft/buspar for now.  Recheck in 6 months.   Ok to apply bactoban cream, but start antibiotic for wound in armpit. Apply warm compresses, recheck in the next 1 week if that area has not closed or significantly improved. Sooner if worse  If you have lab work done today you will be contacted with your lab results within the next 2 weeks.  If you have not heard from [place] then please contact us. The fastest way to get your results is to register for My Chart.   IF you received an x-ray today, you will receive an invoice from [facility]. Please contact [facility] at [phone] with questions or concerns regarding your invoice.   IF you received labwork today, you will receive an invoice from [place]. Please contact [facility] at [phone] with questions or concerns regarding your invoice.   Our billing staff will not be able to assist you with questions regarding bills from these companies.  You will be contacted with the lab results as soon as they are available. The fastest way to get your results is to activate your My Chart account. Instructions are located on the last page of this paperwork. If you have not heard from [place] regarding the results in 2 weeks, please contact this office.         Signed, Merri Ray, MD [facility]

## 2020-06-21 NOTE — Patient Instructions (Addendum)
  Glad to hear that cough is better - ok to continue flonase.   Great work on exercise and weigh has improved. It still may be helpful to meet with medical weight loss specialist.   No change in zoloft/buspar for now.  Recheck in 6 months.   Ok to apply bactoban cream, but start antibiotic for wound in armpit. Apply warm compresses, recheck in the next 1 week if that area has not closed or significantly improved. Sooner if worse  If you have lab work done today you will be contacted with your lab results within the next 2 weeks.  If you have not heard from [place] then please contact us. The fastest way to get your results is to register for My Chart.   IF you received an x-ray today, you will receive an invoice from [facility]. Please contact [facility] at [phone] with questions or concerns regarding your invoice.   IF you received labwork today, you will receive an invoice from [place]. Please contact [facility] at [phone] with questions or concerns regarding your invoice.   Our billing staff will not be able to assist you with questions regarding bills from these companies.  You will be contacted with the lab results as soon as they are available. The fastest way to get your results is to activate your My Chart account. Instructions are located on the last page of this paperwork. If you have not heard from [place] regarding the results in 2 weeks, please contact this office.

## 2020-06-22 LAB — BASIC METABOLIC PANEL
BUN/Creatinine Ratio: 7 — ABNORMAL LOW (ref 9–23)
BUN: 6 mg/dL (ref 6–20)
CO2: 23 mmol/L (ref 20–29)
Calcium: 9.6 mg/dL (ref 8.7–10.2)
Chloride: 101 mmol/L (ref 96–106)
Creatinine, Ser: 0.89 mg/dL (ref 0.57–1.00)
GFR calc Af Amer: 97 mL/min/{1.73_m2} (ref >59)
GFR calc non Af Amer: 84 mL/min/{1.73_m2} (ref >59)
Glucose: 104 mg/dL — ABNORMAL HIGH (ref 65–99)
Potassium: 3.5 mmol/L (ref 3.5–5.2)
Sodium: 141 mmol/L (ref 134–144)

## 2020-06-22 LAB — HEMOGLOBIN A1C
Est. average glucose Bld gHb Est-mCnc: 120 mg/dL
Hgb A1c MFr Bld: 5.8 % — ABNORMAL HIGH (ref 4.8–5.6)

## 2020-06-24 LAB — WOUND CULTURE

## 2020-07-03 DIAGNOSIS — R69 Illness, unspecified: Secondary | ICD-10-CM | POA: Diagnosis not present

## 2020-08-07 DIAGNOSIS — R69 Illness, unspecified: Secondary | ICD-10-CM | POA: Diagnosis not present

## 2020-08-22 ENCOUNTER — Ambulatory Visit: Payer: No Typology Code available for payment source | Admitting: Neurology

## 2020-08-22 ENCOUNTER — Telehealth (HOSPITAL_COMMUNITY): Payer: Self-pay

## 2020-08-22 NOTE — Telephone Encounter (Signed)
Returned pt's call, no answer. Left vm that I will call her back to schedule f/u once auth is received. AW

## 2020-09-04 ENCOUNTER — Encounter: Payer: Self-pay | Admitting: Family Medicine

## 2020-09-04 ENCOUNTER — Other Ambulatory Visit: Payer: Self-pay

## 2020-09-04 ENCOUNTER — Ambulatory Visit (INDEPENDENT_AMBULATORY_CARE_PROVIDER_SITE_OTHER): Payer: No Typology Code available for payment source | Admitting: Family Medicine

## 2020-09-04 VITALS — BP 128/70 | HR 87 | Temp 98.1°F | Resp 16 | Ht 63.0 in | Wt 243.8 lb

## 2020-09-04 DIAGNOSIS — G932 Benign intracranial hypertension: Secondary | ICD-10-CM | POA: Diagnosis not present

## 2020-09-04 DIAGNOSIS — R400 Somnolence: Secondary | ICD-10-CM

## 2020-09-04 DIAGNOSIS — R69 Illness, unspecified: Secondary | ICD-10-CM | POA: Diagnosis not present

## 2020-09-04 DIAGNOSIS — F418 Other specified anxiety disorders: Secondary | ICD-10-CM | POA: Diagnosis not present

## 2020-09-04 DIAGNOSIS — F32A Depression, unspecified: Secondary | ICD-10-CM

## 2020-09-04 MED ORDER — BUSPIRONE HCL 7.5 MG PO TABS: 7.5000 mg | ORAL_TABLET | Freq: Two times a day (BID) | ORAL | 1 refills | Status: DC

## 2020-09-04 MED ORDER — SERTRALINE HCL 50 MG PO TABS: 50.0000 mg | ORAL_TABLET | Freq: Every day | ORAL | 1 refills | Status: DC

## 2020-09-04 NOTE — Patient Instructions (Signed)
Continue to meet with counselor. We can try zoloft 50mg  daily. No change in buspar at this time. Keep follow up with specialists as planned. Recheck in 3 weeks - virtual visit ok.

## 2020-09-04 NOTE — Progress Notes (Signed)
Subjective:  Patient ID: Brenda Molina, female    DOB: 03-15-1986  Age: 35 y.o. MRN: [id number]  CC:  Chief Complaint  Patient presents with  . Depression    Pt struggling with depression and anxiety recently. 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     HPI Brenda Molina presents for   Depression: With anxiety.  Improved, continued on Zoloft 100mg QD, BuSpar 7.5mg  BID at last visit as well as counseling.  Hydroxyzine as needed for sleep. Since last visit - increased depression/anxiety. Unhappy at work - trying to start own business possibly. Overwhelmed with many things going on. Counselor : . Has been meeting every 4 weeks. Had appt today. Plan to step back on starting business temporarily.  No SI/HI.  Takes Buspar 7.5mg  BID - taking every Sertraline - feels like it makes her sleepy. Taking every few days as it makes her sleepy. When taking 50mg  consistently was not causing sleepiness. Tolerated 100mg  dose for awhile, but then with surgery and other meds stopped sertraline, when started back felt sleepy.  No SI.   Depression screen [facility] 2/9 09/04/2020 06/21/2020 05/22/2020 03/02/2019 02/22/2019  Decreased Interest 1 1 0 0 2  Down, Depressed, Hopeless 1 1 0 0 2  PHQ - 2 Score 2 2 0 0 4  Altered sleeping 2 1 - - 2  Tired, decreased energy 3 2 - - 1  Change in appetite 3 1 - - 2  Feeling bad or failure about yourself  1 0 - - 1  Trouble concentrating 1 1 - - 1  Moving slowly or fidgety/restless 0 0 - - 0  Suicidal thoughts 0 0 - - 1  PHQ-9 Score 12 7 - - 12  Difficult doing work/chores - - - - -   Obesity with hyperglycemia/prediabetes Commended on her weight loss since new exercise regimen at her February visit.  No number provided for medical weight loss specialist.did not meet with them yet.  Wt  Readings from Last 3 Encounters:  09/04/20 243 lb 12.8 oz (110.6 kg)  06/21/20 241 lb 12.8 oz (109.7 kg)  05/23/20 248 lb 5 oz (112.6 kg)      Lab Results  Component Value Date   HGBA1C 5.8 (H) 06/21/2020    Wt Readings from Last 3 Encounters:  09/04/20 243 lb 12.8 oz (110.6 kg)  06/21/20 241 lb 12.8 oz (109.7 kg)  05/23/20 248 lb 5 oz (112.6 kg)   Body mass index is 43.19 kg/m.  Recurrent abscess of right axilla See prior treatments earlier this year.  Repeat treatment with doxycycline at February 25 visit. Has resolved.   Obstructive sleep apnea, with history of idiopathic intracranial hypertension. See prior notes, followed by neurology.  Treated with Diamox.  Mild obstructive sleep apnea noted on home sleep testing.  She was on waitlist for AutoPap machine in January.  She is undergoing stent in the right transverse and sigmoid sinuses by interventional radiology.  23-month follow-up planned with neurology, appointment May 31.  Option of lower dose Diamox as papilledema improves. Machine still not available.  Still on Diamox. Less pressure based on eye visit today. Plans to discuss dosing of Diamox with neuro. Updating imaging also planned.   History Patient Active Problem List   Diagnosis Date Noted  .  Pulsatile tinnitus of right ear 05/08/2020  . Perennial and seasonal allergic rhinitis 04/18/2018  . Cough, persistent 04/18/2018  . History of wheezing 04/18/2018  . 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 Normal vaginal delivery 06/06/2015  . RhD negative 06/05/2015  . Preterm premature rupture of membranes 06/05/2015   Past Medical History:  Diagnosis Date  . Allergy   . Anxiety   . Asthma    seasonal- allergy  . GERD (gastroesophageal reflux disease)    "some times"  . HELLP syndrome    post parteum  . PCOS (polycystic ovarian syndrome)   . Sleep apnea    Past Surgical History:  Procedure Laterality Date  . IR ANGIO INTRA EXTRACRAN SEL COM CAROTID INNOMINATE  BILAT MOD SED  04/02/2020  . IR ANGIO INTRA EXTRACRAN SEL COM CAROTID INNOMINATE UNI R MOD SED  05/08/2020  . IR ANGIO VERTEBRAL SEL VERTEBRAL BILAT MOD SED  04/02/2020  . IR CT HEAD LTD  05/08/2020  . IR RADIOLOGIST EVAL & MGMT  02/28/2020  . IR RADIOLOGIST EVAL & MGMT  05/29/2020  . IR TRANSCATH PLC STENT  INITIAL VEIN  INC ANGIOPLASTY  05/08/2020  . IR VENO/JUGULAR RIGHT  05/08/2020  . RADIOLOGY WITH ANESTHESIA N/A 05/01/2020   Procedure: STENTING;  Surgeon: Julieanne Cotton, MD;  Location: [facility];  Service: Radiology;  Laterality: N/A;  . RADIOLOGY WITH ANESTHESIA N/A 05/08/2020   Procedure: STENT PLACEMENT;  Surgeon: Julieanne Cotton, MD;  Location: [facility] OR;  Service: Radiology;  Laterality: N/A;  . WISDOM TOOTH EXTRACTION     No Known Allergies Prior to Admission medications   Medication Sig Start Date End Date Taking? Authorizing Provider  acetaminophen (TYLENOL) 500 MG tablet Take 1,000 mg by mouth every 6 (six) hours as needed for moderate pain.    [provider]  acetaZOLAMIDE (DIAMOX) 500 MG capsule Take 2 capsules (1,000 mg total) by mouth 2 (two) times daily. 04/04/20   Huston Foley, MD  ascorbic acid (VITAMIN C) 500 MG tablet Take 500 mg by mouth daily.    [provider]  aspirin 325 MG tablet Take 325 mg by mouth daily.    [provider]  b complex vitamins capsule Take 1 capsule by mouth daily.    [provider]  busPIRone (BUSPAR) 7.5 MG tablet Take 1 tablet (7.5 mg total) by mouth 2 (two) times daily. 06/21/20   Shade Flood, MD  cetirizine (ZYRTEC) 10 MG tablet Take 10 mg by mouth daily as needed for allergies.    [provider]  clopidogrel (PLAVIX) 75 MG tablet Take 1 tablet (75 mg total) by mouth daily. 04/29/20   Mickie Kay, NP  doxycycline (VIBRA-TABS) 100 MG tablet Take 1 tablet (100 mg total) by mouth 2 (two) times daily. 06/21/20   Shade Flood, MD  fluticasone (FLONASE) 50 MCG/ACT nasal spray Place 2 sprays into  both nostrils daily. 05/22/20   Shade Flood, MD  levonorgestrel (MIRENA, 52 MG,) 20 MCG/24HR IUD 1 each by Intrauterine route once.     [provider]  mupirocin cream (BACTROBAN) 2 % Apply topically 2 (two) times daily. 05/09/20   Brayton El, PA-C  ondansetron (ZOFRAN) 4 MG tablet Take 1 tablet (4 mg total) by mouth every 8 (eight) hours as needed for nausea or vomiting. 02/20/20   Huston Foley, MD  Polyethylene Glycol 400 (BLINK TEARS OP) Place 1 drop into both eyes daily as needed (dry eyes).    [provider]  sertraline (  ZOLOFT) 100 MG tablet Take 1 tablet (100 mg total) by mouth daily. 06/21/20   Shade Flood, MD   Social History   Socioeconomic History  . Marital status: Single    Spouse name: Not on file  . Number of children: 1  . Years of education: Not on file  . Highest education level: Not on file  Occupational History  . Not on file  Tobacco Use  . Smoking status: Never Smoker  . Smokeless tobacco: Never Used  Vaping Use  . Vaping Use: Never used  Substance and Sexual Activity  . Alcohol use: Not Currently  . Drug use: No  . Sexual activity: Yes    Partners: Male    Birth control/protection: Condom  Other Topics Concern  . 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 Has no children. 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    Appearance: She is well-developed.  HENT:     Head: Normocephalic and atraumatic.  Cardiovascular:     Rate and Rhythm: Normal rate.  Pulmonary:     Effort: Pulmonary effort is normal.  Neurological:     Mental Status: She is alert and oriented to person, place, and time.  Psychiatric:        Mood and Affect: Mood normal.        Behavior: Behavior normal.        Thought Content: Thought content normal.        Assessment & Plan:  AMARE KONTOS is a 35 y.o. female . Depression with anxiety - Plan: busPIRone (BUSPAR) 7.5 MG tablet, sertraline (ZOLOFT) 50 MG tablet Depression, unspecified depression type - Plan: sertraline (ZOLOFT) 50 MG tablet  -Decreased control, with some overwhelmed/situational stressors.  Continue counseling.  Intermittent dosing of higher dose Zoloft not effective, possibly sedation with that medication versus sedation from untreated obstructive sleep apnea.  Can try lower dose of 50 mg daily, continue BuSpar same dose for now with recheck in 3 weeks.  Consider adjustment of BuSpar at that time versus titration of Zoloft.  Continue follow-up with neurologist and CPAP when available.  Intracranial hypertension Daytime somnolence  -As above keep follow-up with neurologist to discuss dosage changes of Diamox.  Still waiting on CPAP.  OSA may contribute to daytime somnolence.  Meds ordered this encounter  Medications  . busPIRone (BUSPAR) 7.5 MG tablet    Sig: Take 1 tablet (7.5 mg total) by mouth 2 (two) times daily.    Dispense:  180 tablet    Refill:  1  . sertraline (ZOLOFT) 50 MG tablet    Sig: Take 1 tablet (50 mg total) by mouth daily.    Dispense:  90  tablet    Refill:  1   Patient Instructions  Continue to meet with counselor. We can try zoloft 50mg  daily. No change in buspar at this time. Keep follow up with specialists as planned. Recheck in 3 weeks - virtual visit ok.      Signed, Meredith StaggersJeffrey Maela Takeda,  MD [facility]

## 2020-09-11 ENCOUNTER — Ambulatory Visit: Payer: No Typology Code available for payment source | Admitting: Family Medicine

## 2020-09-12 ENCOUNTER — Telehealth: Payer: Self-pay | Admitting: Neurology

## 2020-09-12 NOTE — Telephone Encounter (Signed)
I received a ophthalmology note from Dr. Sherryll Burger.  Patient had a visit on 09/04/2020.  On visual field testing she had an enlarged blind spot bilaterally.  On OCT, she was noted to have disc edema, improved to near-resolution bilaterally.  Follow-up in 6 months was recommended and continued management of her IIH.   Nothing further needed, FYI.

## 2020-09-24 ENCOUNTER — Ambulatory Visit (INDEPENDENT_AMBULATORY_CARE_PROVIDER_SITE_OTHER): Payer: No Typology Code available for payment source | Admitting: Neurology

## 2020-09-24 ENCOUNTER — Encounter: Payer: Self-pay | Admitting: Neurology

## 2020-09-24 VITALS — BP 132/92 | HR 85 | Ht 63.0 in | Wt 247.0 lb

## 2020-09-24 DIAGNOSIS — G932 Benign intracranial hypertension: Secondary | ICD-10-CM

## 2020-09-24 DIAGNOSIS — G4733 Obstructive sleep apnea (adult) (pediatric): Secondary | ICD-10-CM | POA: Diagnosis not present

## 2020-09-24 NOTE — Progress Notes (Signed)
Subjective:    Patient ID: Brenda Molina is a 35 y.o. female.  HPI     Interim history:   Brenda Molina is a 35 year old right-handed woman with an underlying medical history of PCOS, anxiety, allergies, and severe obesity with a BMI of over 40, whopresents for follow-up consultation of her idiopathic intracranial hypertension. The patient is unaccompanied today. I last saw her on 05/23/2020, at which time she reported doing fairly well, she was on Diamox 1 g twice daily.  She had recuperated fairly well after her stenting.  She had a recent follow-up visit with Dr. Manuella Ghazi on 09/04/2020 and I was able to review her visit note.  She was noted to have disc edema which had improved.  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  She also had a visit with Dr. Manuella Ghazi on 06/06/2020, at which time she was noted to have improvement in her disc edema.   Today, 09/24/2020: She reports feeling stable.  She had stopped taking Plavix a few months ago, she is wondering if she needed to continue to take it.  She had also taken baby aspirin but currently no longer takes it.  She continues to take the Diamox, 2 pills twice daily.  She is working on weight loss.  She has been referred to the medical weight loss clinic at [facility] but has not made an appointment yet.  She is supposed to go back to see her ophthalmologist in November.  She has had no new symptoms.  She was contacted by her DME company regarding her AutoPap and is still on the wait list to get her machine.  The patient's allergies, current medications, family history, past medical history, past social history, past surgical history and problem list were reviewed and updated as appropriate.  Previously:    I saw her on 03/19/20, at which time we talked about her test results. She was advised to increase her Diamox 500 mg in the morning and 1000 mg at night. She had recently undergone a  home sleep test. She was supposed to have a cerebral angiogram in December 2021 and a subsequent follow-up with her ophthalmologist also in December 2021.   Her home sleep test from 03/18/2020 showed overall mild obstructive sleep apnea with an AHI of 7.8/h, O2 nadir of 84%. Given her medical history, she was advised to start AutoPap therapy.  She had consultation with Dr. Estanislado Pandy in the interim and had a cerebral angiogram on 04/02/2020, I reviewed the results:  IMPRESSION: Bilateral severe decrease in caliber of the distal right transverse sinus/sigmoid sinus junction, and of the left distal transverse sinus/sigmoid sinus junction suggestive of high-grade stenosis. Endovascular procedure was advised.  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 The patient was therefore advised to increase her Diamox to 1000 mg twice daily per phone call.  She subsequently had a stent placement in the right transverse sinus and right proximal sigmoid sinus.   I first met her on 02/20/2020 as a referral from the emergency room. She had been sent to the ER after an abnormal eye examination which revealed bilateral significant papilledema. She had a elevated opening pressure during a lumbar puncture in the emergency room on 02/15/2020. She had also undergone MRI of the brain as well as MR orbits and MRV. She was advised to continue with Diamox, she reported headaches. She was also  advised to seek consultation with interventional radiology to investigate bilateral distal transverse sinus stenoses.  She presented to the emergency roomagainon 02/22/2020 with severe headache. A lumbar puncture in the ER was unsuccessful and she had a fluoroscopic guided lumbar puncture on 02/22/2020 which showed an opening pressure of 55, large-volume tap was done and she had a closing pressure of 18. She felt better. She had an interim evaluation with  interventional radiology. She is scheduled early next month for a diagnostic arteriogram to allow evaluation of her venous drainage system and clarification of the suspected distal transverse sinus stenoses.   02/20/20: (She) was sent to the emergency room on 02/15/2020 after an eye examination. She reported headaches and blurry vision. Her ophthalmology exam revealed florid bilateral papilledema. I reviewed emergency room records. Ophthalmology records were also scanned in from her visit to [facility]. In the emergency room she had multiple scans including brain MRI with and without contrast, MR orbits with and without contrast and MRV head with and without contrast. I reviewed the results: IMPRESSION: 1. Technically limited exam due to extensive susceptibility artifact related to dental hardware. 2. Subtle bulging of the optic discs at the posterior aspects of both globes, left more prominent than right, consistent with papilledema. 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 Constellation of findings can be seen in the setting of idiopathic intracranial hypertension. Correlation with LP and opening pressures could be performed for further evaluation as warranted. 3. Otherwise normal MRI of the brain and orbits. No other acute abnormality identified. No definite evidence for acute optic neuritis. 4. Otherwise normal intracranial MRV. No evidence for dural sinus thrombosis.  He also had a lumbar puncture in the emergency room with an opening pressure elevated at 42. Routine laboratory results were obtained including routine tests on her CSF which initially was blood-tinged with an RBC count of 973, WBC count of 7 with subsequent improvement in tube 4. Protein in the CSF was 18, glucose was 67.  She was started on Diamox in the ER.  She reports that she has noticed nausea since starting the Diamox. She is taking 500 mg twice daily. She had  occasional retching and brought up mucus. She had improvement in her headache but now she has a pressure sensation and still has blurry vision and light sensitivity. She has not noticed any new symptoms. She has not made a follow-up appointment yet with her ophthalmologist, Dr. Tama High. She was told to follow-up in 1 month. She does not have any medication for nausea. She reports some snoring and sleep disruption and feels tired during the day. He has never had a sleep study. She lives with her 83-year-old daughter. Her Epworth sleepiness score is 2 out of 24, fatigue severity score is 51 out of 63. She goes to bed around 830 and rise time is between 6:15 AM and 7 AM.  She went to urgent care in early October around the eighth and was told she had a sinus and ear infection was treated with antibiotic treatment. She went back to urgent care about a week later and was given a prescription for prednisone.  Her Past Medical History Is Significant For: Past Medical History:  Diagnosis Date  . Allergy   . Anxiety   . Asthma    seasonal- allergy  . GERD (gastroesophageal reflux disease)    "some times"  . HELLP syndrome    post parteum  . PCOS (polycystic ovarian syndrome)   . Sleep  apnea     Her Past Surgical History Is Significant For: Past Surgical History:  Procedure Laterality Date  . IR ANGIO INTRA EXTRACRAN SEL COM CAROTID INNOMINATE BILAT MOD SED  04/02/2020  . IR ANGIO INTRA EXTRACRAN SEL COM CAROTID INNOMINATE UNI R MOD SED  05/08/2020  . IR ANGIO VERTEBRAL SEL VERTEBRAL BILAT MOD SED  04/02/2020  . IR CT HEAD LTD  05/08/2020  . IR RADIOLOGIST EVAL & MGMT  02/28/2020  . IR RADIOLOGIST EVAL & MGMT  05/29/2020  . IR TRANSCATH PLC STENT  INITIAL VEIN  INC ANGIOPLASTY  05/08/2020  . IR VENO/JUGULAR RIGHT  05/08/2020  . RADIOLOGY WITH ANESTHESIA N/A 05/01/2020   Procedure: STENTING;  Surgeon: Luanne Bras, MD;  Location: [facility];  Service: Radiology;  Laterality: N/A;  .  RADIOLOGY WITH ANESTHESIA N/A 05/08/2020   Procedure: STENT PLACEMENT;  Surgeon: Luanne Bras, MD;  Location: [facility];  Service: Radiology;  Laterality: N/A;  . WISDOM TOOTH EXTRACTION      Her Family History Is Significant For: Family History  Problem Relation Age of Onset  . Asthma Father   . Hypertension Father     Her Social History Is Significant For: Social History   Socioeconomic History  . Marital status: Single    Spouse name: Not on file  . Number of children: 1  . Years of education: Not on file  . Highest education level: Not on file  Occupational History  . Not on file  Tobacco Use  . Smoking status: Never Smoker  . Smokeless tobacco: Never Used  Vaping Use  . Vaping Use: Never used  Substance and Sexual Activity  . Alcohol use: Not Currently  . Drug use: No  . Sexual activity: Yes    Partners: Male    Birth control/protection: Condom  Other Topics Concern  . 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 acetaminophen (TYLENOL) 500 MG tablet Take 1,000 mg by mouth every 6 (six) hours as needed for moderate pain.  Marland Kitchen acetaZOLAMIDE (DIAMOX) 500 MG capsule Take 2 capsules (1,000 mg total) by mouth 2 (two) times daily.  Marland Kitchen ascorbic acid (VITAMIN C) 500 MG tablet Take 500 mg by mouth daily.  Marland Kitchen b complex vitamins capsule Take 1 capsule by mouth daily.  . busPIRone (BUSPAR) 7.5 MG tablet Take 1 tablet (7.5 mg total) by mouth 2 (two) times daily.  .  cetirizine (ZYRTEC) 10 MG tablet Take 10 mg by mouth daily as needed for allergies.  . fluticasone (FLONASE) 50 MCG/ACT nasal spray Place 2 sprays into both nostrils daily.  Marland Kitchen levonorgestrel (MIRENA, 52 MG,) 20 MCG/24HR IUD 1 each by Intrauterine route once.   . Polyethylene Glycol 400 (BLINK TEARS OP) Place 1 drop into both eyes daily as needed (dry eyes).  . sertraline (ZOLOFT) 50 MG tablet Take 1 tablet (50 mg total) by mouth daily.  Marland Kitchen aspirin 325 MG tablet Take 325  mg by mouth daily. (Patient not taking: No sig reported)  . clopidogrel (PLAVIX) 75 MG tablet Take 1 tablet (75 mg total) by mouth daily. (Patient not taking: No sig reported)  . [DISCONTINUED] ondansetron (ZOFRAN) 4 MG tablet Take 1 tablet (4 mg total) by mouth every 8 (eight) hours as needed for nausea or vomiting. (Patient not taking: Reported on 09/24/2020)   No facility-administered encounter medications on file as of 09/24/2020.  :  Review of Systems:  Out of a complete 14 point review of systems, all are reviewed and negative with the exception of these symptoms as listed below:  Review of Systems  Neurological:       Here for 4 month f/u. Reports she has been doing ok, would like to discuss if she needs to continue taking the plavix at this time.      Objective:  Neurological Exam  Physical Exam Physical Examination:   Vitals:   09/24/20 0842  BP: (!) 132/92  Pulse: 85    General Examination: The patient is a very pleasant 35 y.o. female in no acute distress. She appears well-developed and well-nourished and well groomed.   HEENT:Normocephalic, atraumatic, pupils are equal, round and reactive to light, no photophobia.  Extraocular tracking is well-preserved, face is symmetric, normal facial sensation and normal facial animation, hearing intact.  Airway examination reveals benign and stable findings.  Tongue protrudes centrally and palate elevates symmetrically.   mildmouth dryness, gooddental hygiene and  moderateairway crowding.   Chest:Clear to auscultation without wheezing, rhonchi or crackles noted.  Heart:S1+S2+0, regular and normal without murmurs, rubs or gallops noted.   Abdomen:Soft, non-tender and non-distended.  Extremities:There isnopitting edema in the distal lower extremities bilaterally.   Skin: Warm and dry without trophic changes noted.  Musculoskeletal: exam reveals no obvious joint deformities, tenderness or joint swelling or erythema.   Neurologically:  Mental status: The patient is awake, alert and oriented in all 4 spheres.Herimmediate and remote memory, attention, language skills and fund of knowledge are appropriate. There is no evidence of aphasia, agnosia, apraxia or anomia. Speech is clear with normal prosody and enunciation. Thought process is linear. Mood is normaland affect is normal.  Cranial nerves II - XII are as described above under HEENT exam. In addition: shoulder shrug is normal with equal shoulder height noted. Motor exam: Normal bulk, strength and tone is noted. There is no drift, tremor or rebound. Romberg is negative. Reflexes are1+ throughout. Fine motor skills and coordination: intact with normal finger taps, normal hand movements, normal rapid alternating patting, normal foot taps and normal foot agility.  Cerebellar testing: No dysmetria or intention tremor on finger to nose testing. Heel to shin is unremarkable bilaterally. There is no truncal or gait ataxia.  Sensory exam: intact to light touch in the upper and lower extremities. Gait, station and balance:Shestands easily. No veering to one side is noted. No leaning to one side is noted. Posture is age-appropriate and stance is narrow based. Gait showsnormalstride length and normalpace. No problems turning are noted. Tandem walk is unremarkable.   Assessmentand Plan:   In summary,Adeana D Bruceis a very pleasant 26 year oldfemalewith an underlying medical history  of PCOS, anxiety, allergies, and severe obesity with a BMI of over 40, who presents forfollow-up consultation of heridiopathic intracranial hypertension after being diagnosed with bilateral florid papilledema by her ophthalmologistin October 2021. She went back to the ER on 02/22/2020 and had a large-volume tap with an opening pressure highly elevated at 55 cm. She felt  better after the lumbar puncture. She had been on Diamox 500 mg twice daily since October 2021.  She was advised to increase this to 1 pill in the morning and 2 at bedtime in November 2021 and in December after her checkup with the ophthalmologist she was encouraged to increase the Diamox to 1000 mg twice daily as her papilledema had become worse.  She had an interim home sleep test that showed mild obstructive sleep apnea.  She is on the wait list for an AutoPap machine as there is a shortage on new machines.  She is willing to try AutoPap therapy.  Furthermore, she had interim consultation with interventional radiology and is now s/p stent placement in the right transverse and sigmoid sinuses without complications.  She is no longer on Plavix, she no longer takes any baby aspirin.  She is advised to continue with baby aspirin until she can get more advice from Dr. Arlean Hopping office as to whether she should stay on baby aspirin long-term  versus Plavix long-term.  She is willing to get in touch with their office.  She is advised to continue with Diamox 1000 mg twice daily.  She is advised to follow-up with one of our nurse practitioners in 6 months, sooner if needed.  She had interim follow-up appointments with her ophthalmologist and had stable and improving papilledema in February as well as May 2022.  She is advised that if her eye examination in 6 months reveals further improvement or near resolution of her papilledema, we can consider reducing her Diamox to 500 mg twice daily.  She is advised to make a follow-up appointment depending on  when she starts her AutoPap therapy as she will need a follow-up per insurance requirement within 3 months of starting AutoPap.  For now, we will plan a checkup in this clinic in 6 months.  She did not need a refill on the Diamox.  I answered all her questions today and she was in agreement with the plan.   I spent 30 minutes in total face-to-face time and in reviewing records during pre-charting, more than 50% of which was spent in counseling and coordination of care, reviewing test results, reviewing medications and treatment regimen and/or in discussing or reviewing the diagnosis of IIH, OSA, the prognosis and treatment options. Pertinent laboratory and imaging test results that were available during this visit with the patient were reviewed by me and considered in my medical decision making (see chart for details).

## 2020-09-24 NOTE — Patient Instructions (Signed)
It was nice to see you again today.  As discussed, we will maintain your treatment with Diamox 2 pills twice daily for now.  If your next eye exam shows further improvement, and hopefully resolution of your papilledema, we will consider reducing your Diamox to 1 pill twice daily at your next appointment.  Please make an appointment in 6 months to see one of our nurse practitioners and continue to work on weight loss.  When you have your AutoPap machine, we will need to see you back within 3 months of your set up.  Please call us and let us know when you start your AutoPap therapy.  As far as the Plavix, I believe you can stay off of it but please double check with Dr. Fatima Sanger office or check with Asher Muir in his office.  For now, I would recommend that you continue taking a daily baby aspirin but I would favor you get recommendations from Dr. Fatima Sanger office about this.

## 2020-10-05 ENCOUNTER — Other Ambulatory Visit: Payer: Self-pay | Admitting: Neurology

## 2020-10-23 ENCOUNTER — Other Ambulatory Visit (HOSPITAL_COMMUNITY): Payer: Self-pay | Admitting: Interventional Radiology

## 2020-10-23 DIAGNOSIS — H93A1 Pulsatile tinnitus, right ear: Secondary | ICD-10-CM

## 2020-10-30 DIAGNOSIS — R69 Illness, unspecified: Secondary | ICD-10-CM | POA: Diagnosis not present

## 2020-11-07 ENCOUNTER — Other Ambulatory Visit: Payer: Self-pay

## 2020-11-07 ENCOUNTER — Other Ambulatory Visit (HOSPITAL_COMMUNITY): Payer: Self-pay | Admitting: Interventional Radiology

## 2020-11-07 ENCOUNTER — Ambulatory Visit (HOSPITAL_COMMUNITY): Payer: 59

## 2020-11-07 ENCOUNTER — Ambulatory Visit (HOSPITAL_COMMUNITY)
Admission: RE | Admit: 2020-11-07 | Discharge: 2020-11-07 | Disposition: A | Discharge status: Home / Self Care | Payer: 59 | Source: Ambulatory Visit | Attending: Interventional Radiology | Admitting: Interventional Radiology

## 2020-11-07 ENCOUNTER — Encounter (HOSPITAL_COMMUNITY): Payer: Self-pay

## 2020-11-07 DIAGNOSIS — H93A1 Pulsatile tinnitus, right ear: Secondary | ICD-10-CM

## 2020-11-07 DIAGNOSIS — H9319 Tinnitus, unspecified ear: Secondary | ICD-10-CM | POA: Diagnosis not present

## 2020-11-07 DIAGNOSIS — Z95828 Presence of other vascular implants and grafts: Secondary | ICD-10-CM | POA: Diagnosis not present

## 2020-11-07 IMAGING — MR MR HEAD WO/W CM
18 of 20 series · 42 of 48 positions shown · IV contrast (gadavist)
Comparison: Interventional study [DATE].  MRI [DATE].

CLINICAL DATA: Pulsatile tinnitus on the right. Follow-up
intracranial stent

EXAM:
MRI HEAD WITHOUT AND WITH CONTRAST
TECHNIQUE: Multiplanar, multiecho pulse sequences of the brain and surrounding
structures were obtained without and with intravenous contrast.
CONTRAST:  9mL GADAVIST GADOBUTROL 1 MMOL/ML IV SOLN

[Series 5: DWI · axial · 3.0mm · 0.88mm/px · z∈[-154,-16]mm · 6 of 104 slices shown (1 of 4)]
[im 1/104]
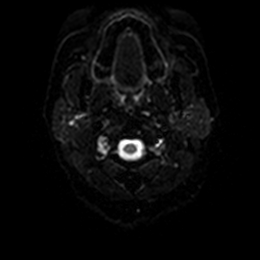
[im 21/104]
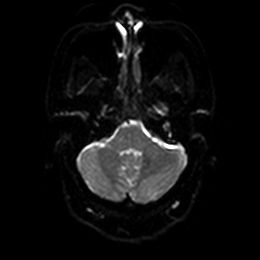
[im 42/104]
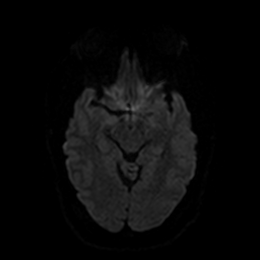
[im 62/104]
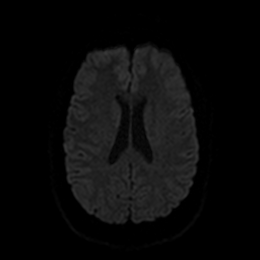
[im 83/104]
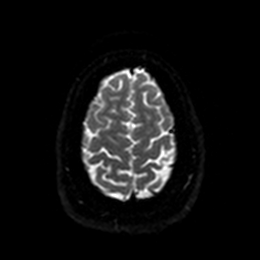
[im 104/104]
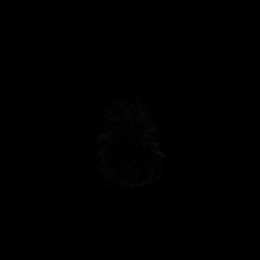

[Series 6: DWI · axial · 3.0mm · 0.88mm/px · z∈[-154,-16]mm · 3 of 52 slices shown (2 of 4)]
[im 1/52]
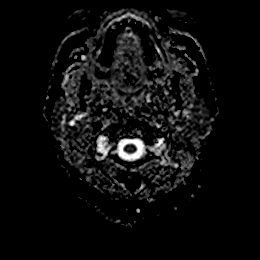
[im 26/52]
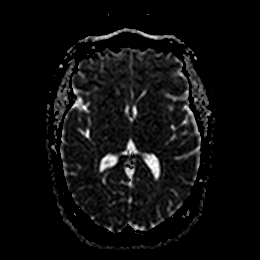
[im 52/52]
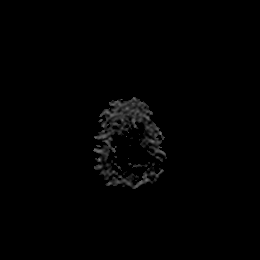

[Series 7: DWI · coronal · 4.0mm · 0.88mm/px · 4 of 76 slices shown (3 of 4)]
[im 1/76]
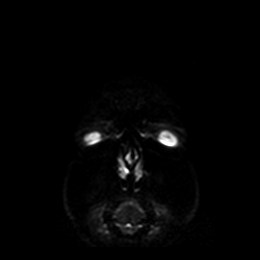
[im 26/76]
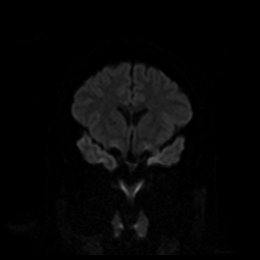
[im 51/76]
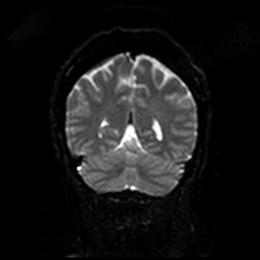
[im 76/76]
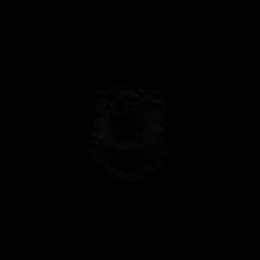

[Series 8: DWI · coronal · 4.0mm · 0.88mm/px · 2 of 38 slices shown (4 of 4)]
[im 1/38]
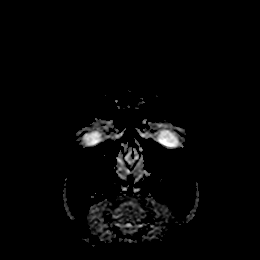
[im 38/38]
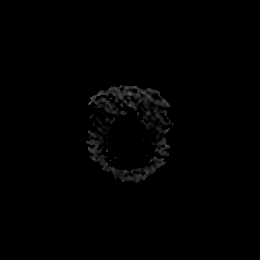

[Series 9: T1 · sagittal · 5.0mm · 0.75mm/px · 1 of 23 slices shown]
[im 1/23]
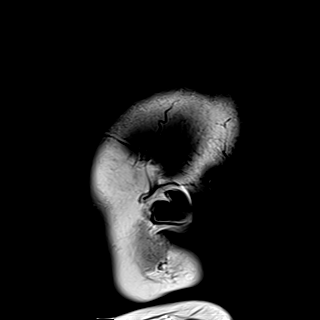

[Series 10: T2 · axial · 5.0mm · 0.72mm/px · 1 of 25 slices shown]
[im 1/25]
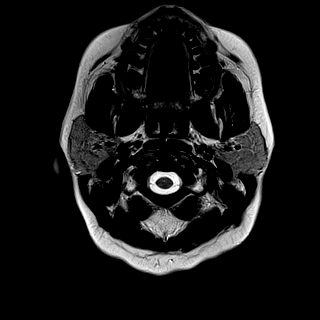

[Series 11: t1_tse_r_cor_3mm · coronal · 3.0mm · 0.33mm/px · 1 of 13 slices shown (1 of 2)]
[im 1/13]
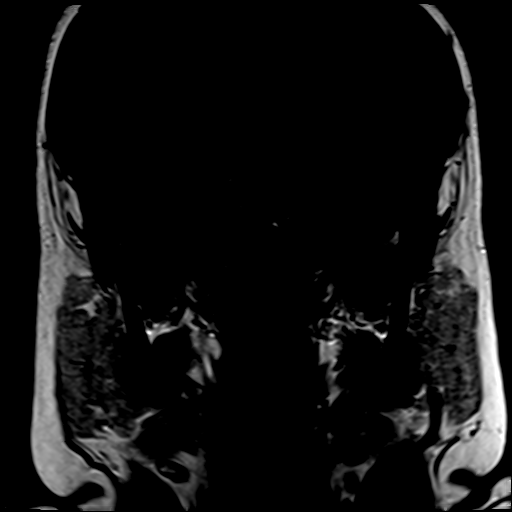

[Series 12: t2_(id)_tra_iso · axial · 0.6mm · 0.56mm/px · z∈[-135,-97]mm · 4 of 72 slices shown]
[im 1/72]
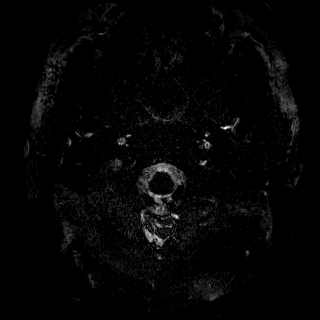
[im 24/72]
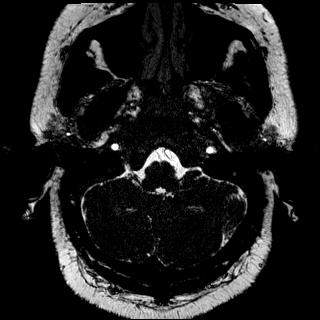
[im 48/72]
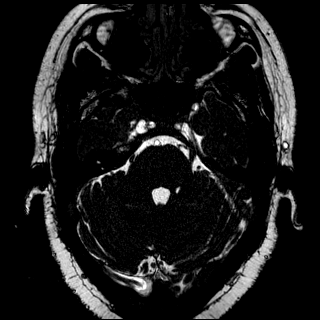
[im 72/72]
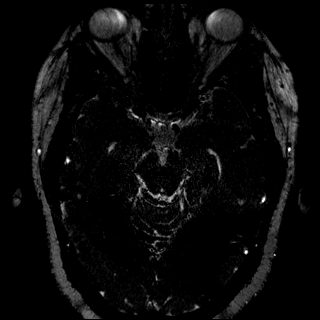

[Series 13: t1_tse_tra_3mm · axial · 3.0mm · 0.31mm/px · 1 of 15 slices shown (1 of 2)]
[im 1/15]
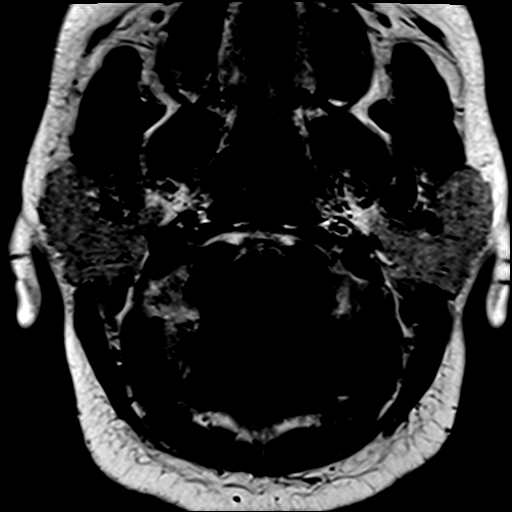

[Series 14: mag_images · axial · 3.0mm · 0.90mm/px · z∈[-160,+1]mm · 3 of 60 slices shown]
[im 1/60]
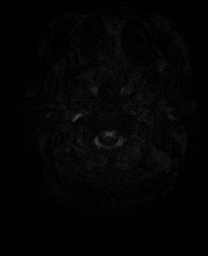
[im 30/60]
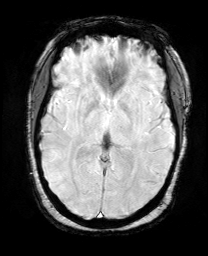
[im 60/60]
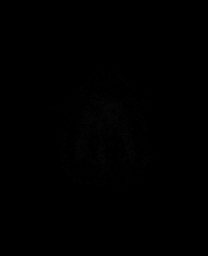

[Series 15: pha_images · axial · 3.0mm · 0.90mm/px · z∈[-160,+1]mm · 3 of 60 slices shown]
[im 1/60]
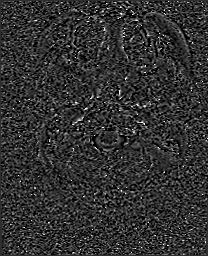
[im 30/60]
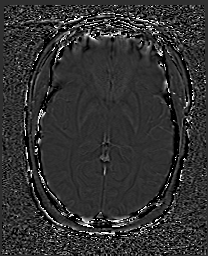
[im 60/60]
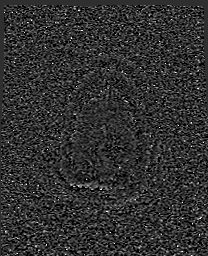

[Series 16: swi_images · axial · 3.0mm · 0.90mm/px · z∈[-160,+1]mm · 3 of 60 slices shown]
[im 1/60]
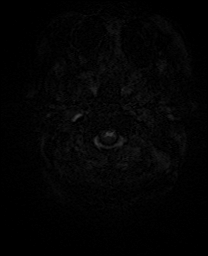
[im 30/60]
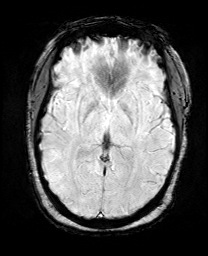
[im 60/60]
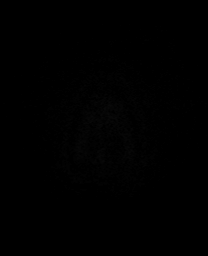

[Series 17: mip_images(sw) · axial · 24.0mm · 0.90mm/px · z∈[-150,-9]mm · 3 of 53 slices shown]
[im 1/53]
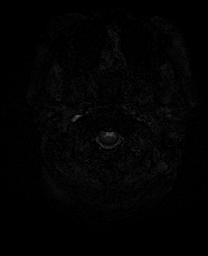
[im 27/53]
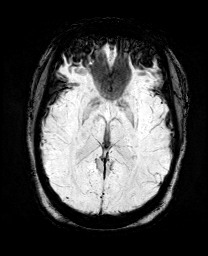
[im 53/53]
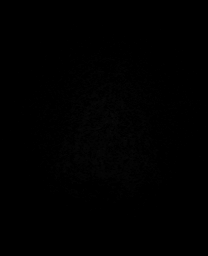

[Series 18: FLAIR · axial · 5.0mm · 0.45mm/px · 1 of 25 slices shown]
[im 1/25]
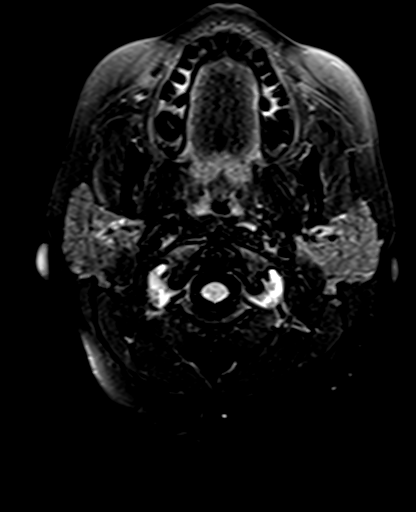

[Series 20: T2 post-contrast · coronal · 5.0mm · 0.72mm/px · 2 of 28 slices shown]
[im 1/28]
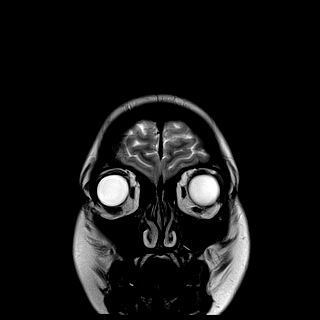
[im 28/28]
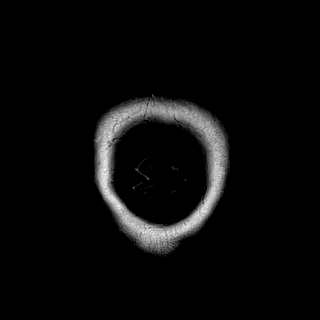

[Series 21: t1_tse_tra_3mm · axial · 3.0mm · 0.37mm/px · 1 of 15 slices shown (2 of 2)]
[im 1/15]
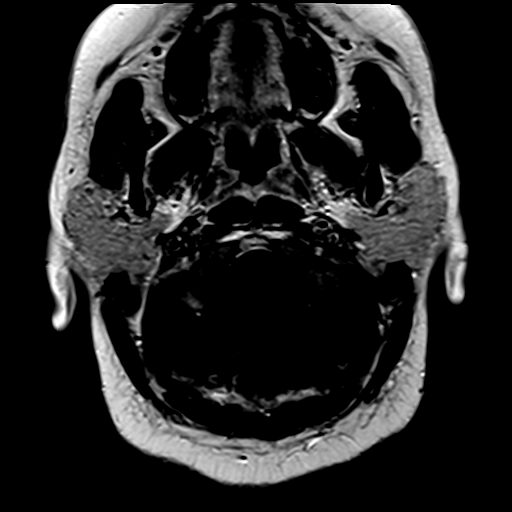

[Series 22: t1_tse_r_cor_3mm · coronal · 3.0mm · 0.33mm/px · 1 of 13 slices shown (2 of 2)]
[im 1/13]
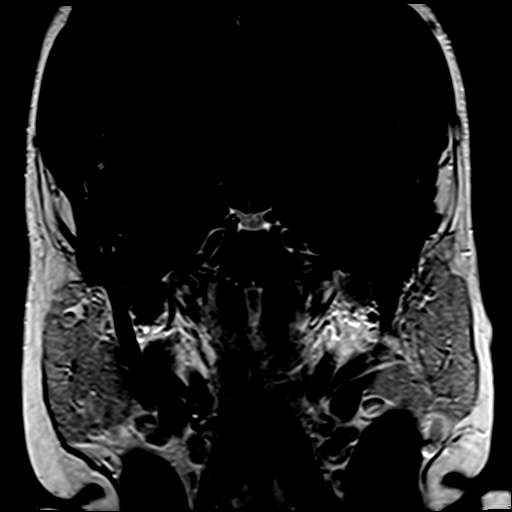

[Series 24: T1 post-contrast · coronal · 5.0mm · 0.34mm/px · 2 of 28 slices shown]
[im 1/28]
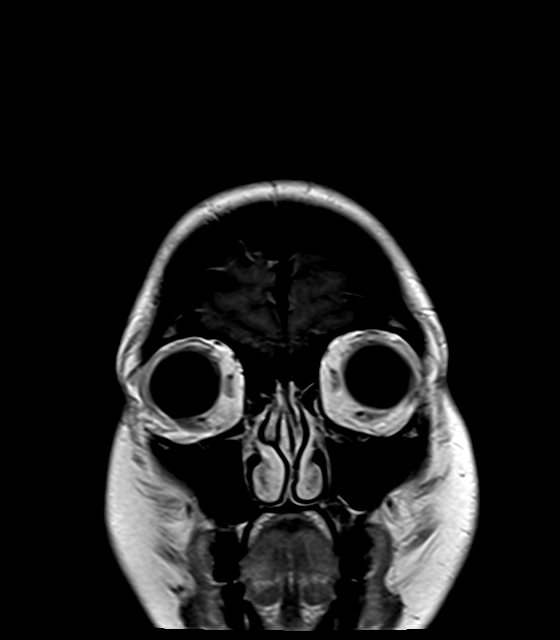
[im 28/28]
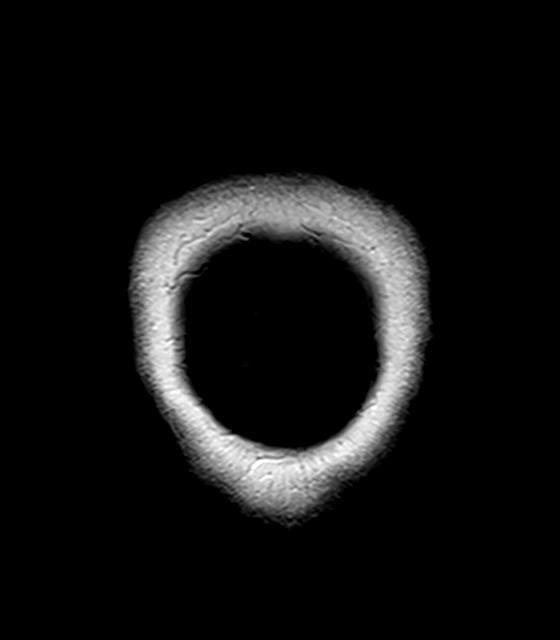

[42 of 48 positions shown; findings below may reference images not displayed]

FINDINGS: Brain: The brain itself has a normal appearance without evidence of
atrophy, old or recent infarction, mass lesion, hemorrhage,
hydrocephalus or extra-axial collection. After contrast
administration, no abnormal brain or leptomeningeal enhancement
occurs.

Vascular: Flow is present in the major arterial structures at the
base of the brain. Flow is present within the venous sinuses. There
is signal loss relating to the right lateral transverse sinus stent,
but there appears to be normal venous flow and contrast signal
proximal and distal to the stent, therefore this is presumed to be
widely patent.

Skull and upper cervical spine: Negative

Sinuses/Orbits: Clear/normal

Other: None
IMPRESSION: Continued normal appearance of the brain itself.

Signal loss related to the right transverse sinus stent. The venous
sinuses appear normal proximal and distal to that.

## 2020-11-07 IMAGING — MR MR MRV HEAD WO/W CM
5 series · 36 of 48 positions shown · IV contrast (Contrast agent)
Comparison: Previous interventional study [DATE]. MRI studies
[DATE].

CLINICAL DATA: Follow-up previous right transverse sinus stent
placement.

EXAM:
MR VENOGRAM HEAD WITHOUT AND WITH CONTRAST
TECHNIQUE: Angiographic images of the intracranial venous structures were
acquired using MRV technique without and with intravenous contrast.
CONTRAST:  9mL GADAVIST GADOBUTROL 1 MMOL/ML IV SOLN

[Series 9: tof_fl2d_paracor · coronal · 2.0mm · 0.98mm/px · 8 of 140 slices shown]
[im 1/140]
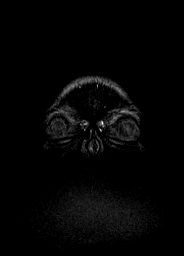
[im 20/140]
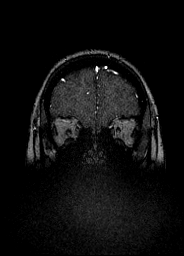
[im 40/140]
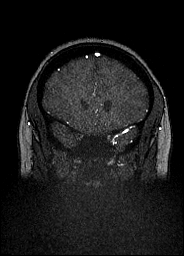
[im 60/140]
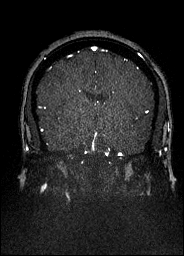
[im 80/140]
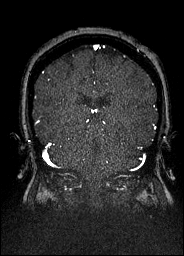
[im 100/140]
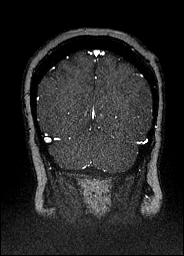
[im 120/140]
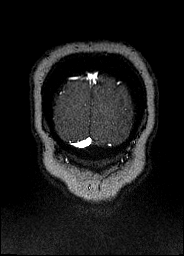
[im 140/140]
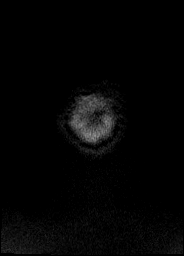

[Series 14: venous inhance coronal_msum · coronal · portal-venous · 0.9mm · 0.57mm/px · 8 of 208 slices shown]
[im 1/208]
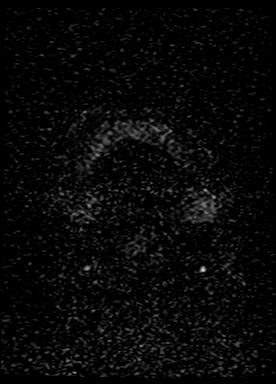
[im 32/208]
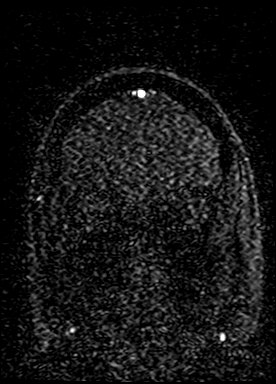
[im 64/208]
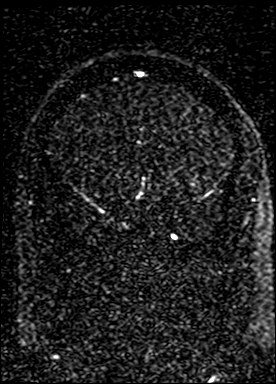
[im 96/208]
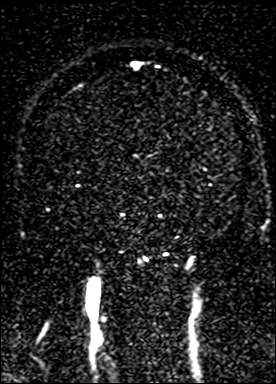
[im 112/208]
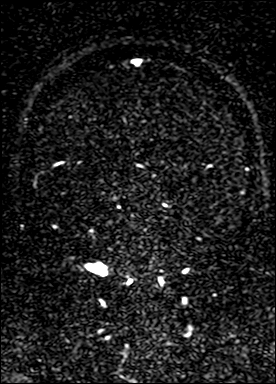
[im 144/208]
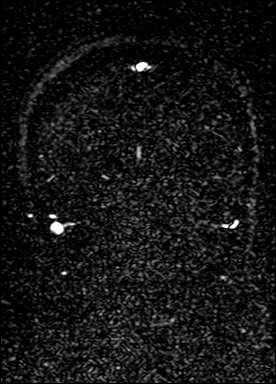
[im 176/208]
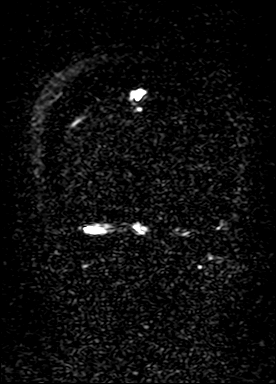
[im 208/208]
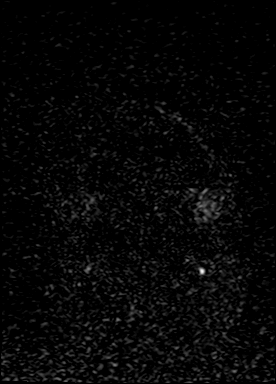

[Series 17: t1_fl3d_sag_p2_iso · sagittal · 1.0mm · 0.98mm/px · 8 of 144 slices shown]
[im 1/144]
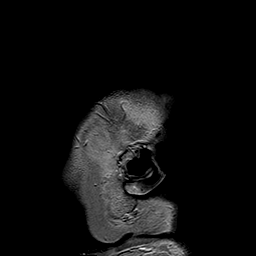
[im 16/144]
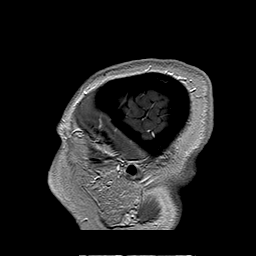
[im 48/144]
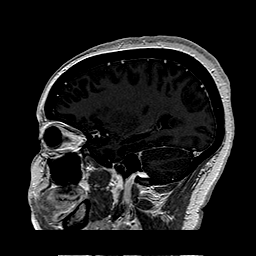
[im 64/144]
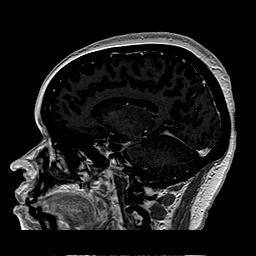
[im 80/144]
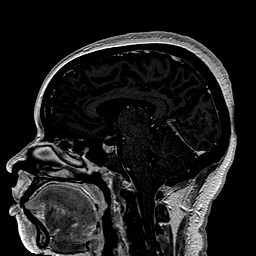
[im 96/144]
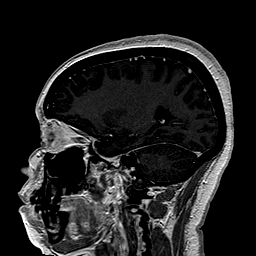
[im 128/144]
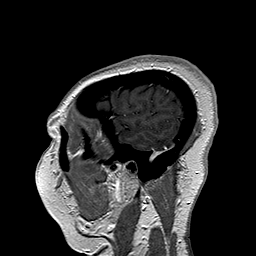
[im 144/144]
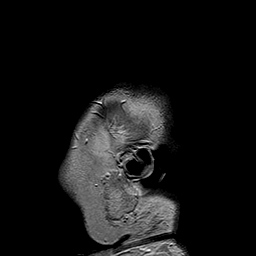

[Series 18: t1_fl3d_sag_p2_iso_mpr_ axial · axial · 2.0mm · 0.45mm/px · z∈[-192,+17]mm · 8 of 117 slices shown]
[im 1/117]
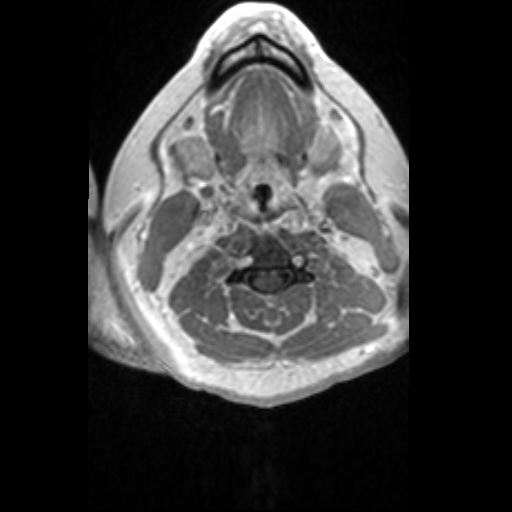
[im 17/117]
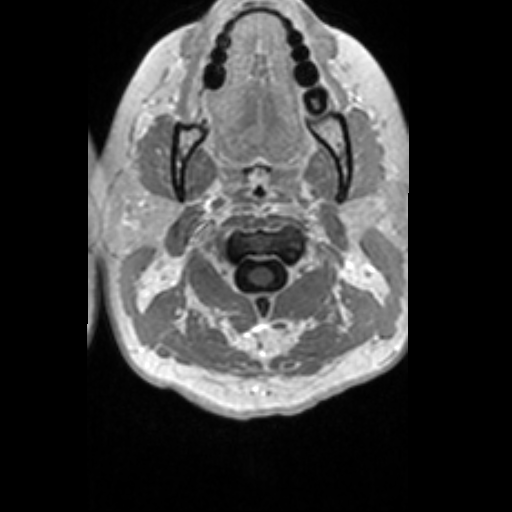
[im 34/117]
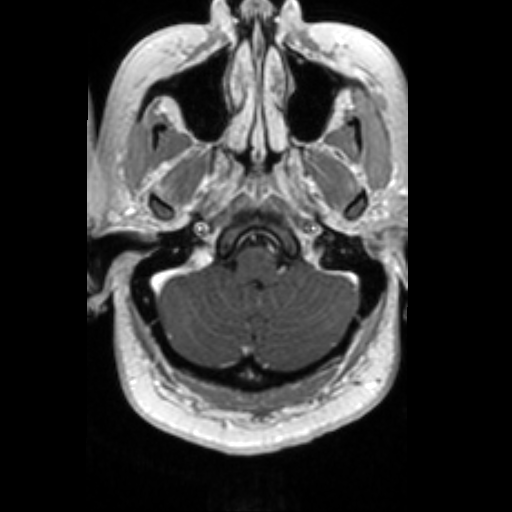
[im 50/117]
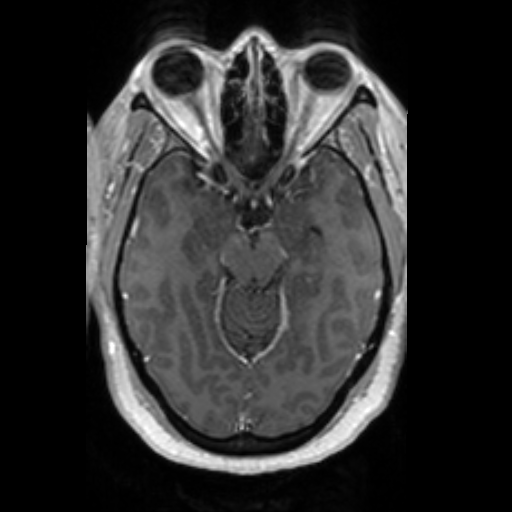
[im 67/117]
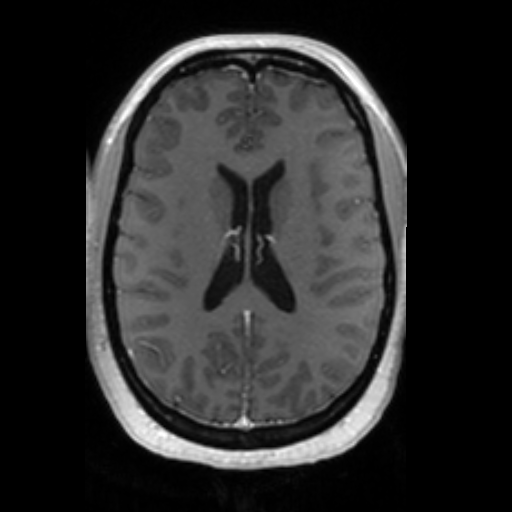
[im 83/117]
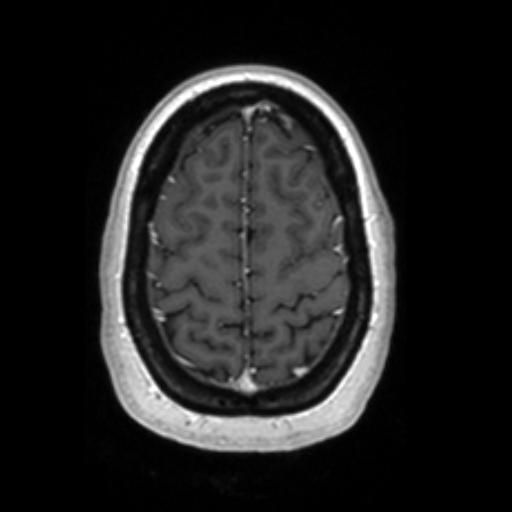
[im 100/117]
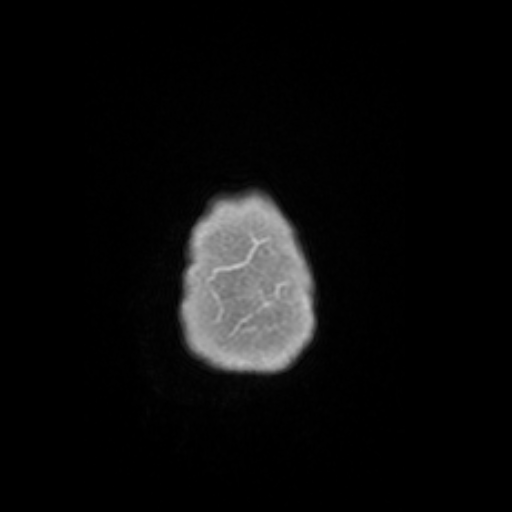
[im 117/117]
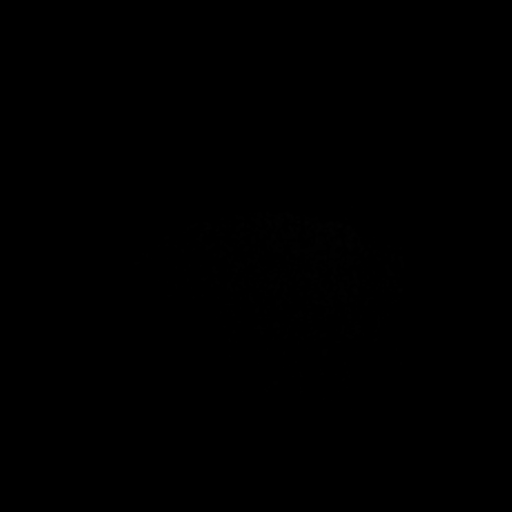

[Series 19: t1_fl3d_sag_p2_iso_mpr_coronal · coronal · 2.0mm · 0.45mm/px · 4 of 115 slices shown]
[im 1/115]
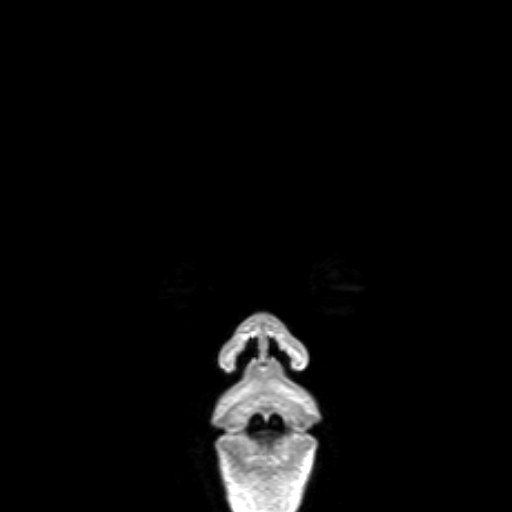
[im 17/115]
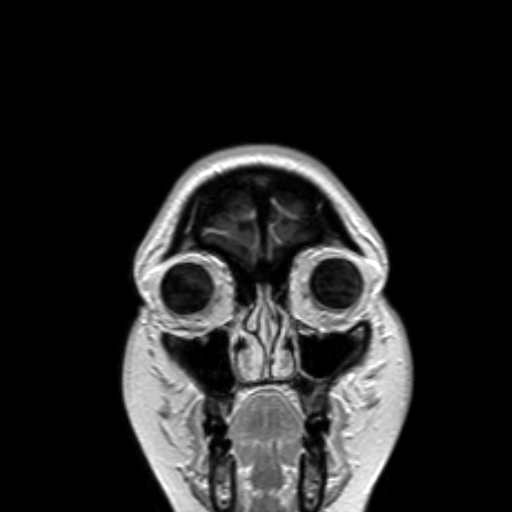
[im 33/115]
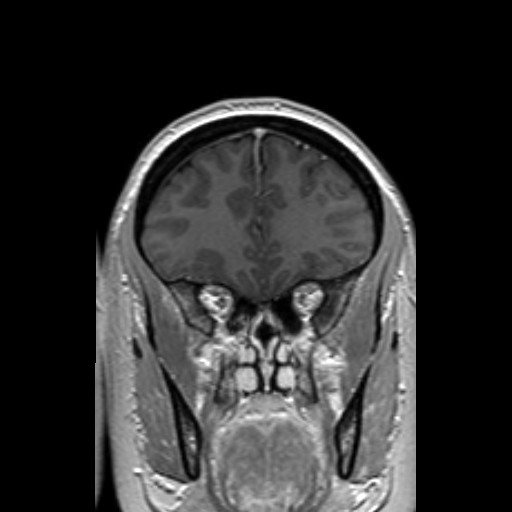
[im 49/115]
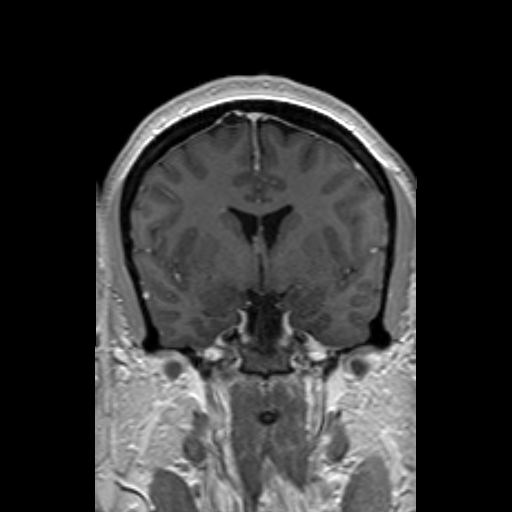

[36 of 48 positions shown; findings below may reference images not displayed]

FINDINGS: Superior sagittal sinus is normal. Mild signal loss related to the
presence of a right lateral transverse sinus stent. There appears to
be normal an widely patent flow through the stent. Continued chronic
stenosis of the left transverse sinus with minimal discernible
luminal diameter. Deep veins appear normal.
IMPRESSION: Good appearance of the right lateral transverse sinus stent. Mild
signal loss associated with the stent, but normal flow through that
area allowing for that. Chronic stenosis of the left transverse
sinus as seen previously.

## 2020-11-07 MED ORDER — GADOBUTROL 1 MMOL/ML IV SOLN
9.0000 mL | Freq: Once | INTRAVENOUS | Status: AC | PRN
  Administered 2020-11-07: 9 mL via INTRAVENOUS

## 2020-11-13 ENCOUNTER — Telehealth (HOSPITAL_COMMUNITY): Payer: Self-pay

## 2020-11-13 NOTE — Telephone Encounter (Signed)
Pt agreed to f/u in 6 months with mrv. She wanted to know if she is still supposed to be taking Plavix. I have sent a message to the PA to advise. AW

## 2020-11-14 ENCOUNTER — Telehealth (HOSPITAL_COMMUNITY): Payer: Self-pay

## 2020-11-14 NOTE — Telephone Encounter (Signed)
Returned pt's call. Pt wanted to know if she should start back taking her Plavix because she hasn't been taking it. Per Deveshwar, as long as she is still taking Aspirin 325mg  then she is fine. Left vm for patient at her request. AW

## 2020-11-30 DIAGNOSIS — G4733 Obstructive sleep apnea (adult) (pediatric): Secondary | ICD-10-CM | POA: Diagnosis not present

## 2020-12-09 DIAGNOSIS — R69 Illness, unspecified: Secondary | ICD-10-CM | POA: Diagnosis not present

## 2020-12-31 DIAGNOSIS — G4733 Obstructive sleep apnea (adult) (pediatric): Secondary | ICD-10-CM | POA: Diagnosis not present

## 2021-01-20 DIAGNOSIS — R69 Illness, unspecified: Secondary | ICD-10-CM | POA: Diagnosis not present

## 2021-01-27 ENCOUNTER — Ambulatory Visit (INDEPENDENT_AMBULATORY_CARE_PROVIDER_SITE_OTHER): Payer: 59 | Admitting: Neurology

## 2021-01-27 ENCOUNTER — Encounter: Payer: Self-pay | Admitting: Neurology

## 2021-01-27 VITALS — BP 136/90 | HR 74 | Ht 63.0 in | Wt 249.2 lb

## 2021-01-27 DIAGNOSIS — G932 Benign intracranial hypertension: Secondary | ICD-10-CM

## 2021-01-27 DIAGNOSIS — G4733 Obstructive sleep apnea (adult) (pediatric): Secondary | ICD-10-CM

## 2021-01-27 NOTE — Progress Notes (Signed)
Subjective:    Patient ID: Brenda Molina is a 35 y.o. female.  HPI    Interim history:   Brenda Molina is a 35 year old right-handed woman with an underlying medical history of PCOS, anxiety, allergies, and severe obesity with a BMI of over 45, who presents for follow-up consultation of her idiopathic intracranial hypertension and sleep apnea, after starting autoPAP therapy. The patient is unaccompanied today. I last saw her on 09/24/2020, at which time she felt stable.  She had not started AutoPap therapy yet.  She was working on weight loss and had been referred to healthy weight and wellness but had not made an appointment yet.  She was supposed to see her ophthalmologist in November 2022.   She was set up with AutoPap therapy in the interim on 11/30/2020.  She has an Tourist information centre manager.  Today, 01/27/2021: I reviewed her AutoPap compliance data from 12/01/2020 through 12/30/2020, which is a total of 30 days, during which time she used her machine 17 days with percent use days greater than 4 hours at 10% only, indicating significantly no compliance, average usage of 2.1 hours for days on treatment, average AHI at goal at 0.4/h, 95th percentile of pressure at 5.6 cm with a range of 5 to 11 cm, leak on the low side with a 95th percentile at 5.5 L/min.  She has used her machine very sporadically in the past month.  She was a little bit more consistent in the first month.  She indicates difficulty tolerating the mask, she started off with a medium F 20 fullface mask but now is on a large because she felt it was more comfortable with a larger size but the large looks like it is too large for her when she tried it on in the office today.  She reports that she does open her mouth at night.  She is still quite uncomfortable with the mask but not so much with the pressure, she is able to tolerate the pressure.  She feels like she has to sleep a little bit more upright since starting the AutoPap.  She is not ready to  give it up quite yet but is still struggling with it.  She is willing to try a nasal interface.  She was provided with a sample medium P 10 nasal pillows interface.  She reports that she has not made her appointment with the weight loss clinic yet.  She called the wrong place which was a bariatric clinic and has not actually called the medical weight loss clinic yet.  She does know that there is a wait list.  She reports that her weight has been fluctuating, no new symptoms as far as her vision goes and she has an appointment next month with her ophthalmologist but is also due for her regular eye appointment.  She has not been using her prescription eyeglasses consistently.  She continues to take her Diamox.  She has an appointment next month in our clinic for routine follow-up as well.  She indicates that when she was using her machine a little bit more consistently in the first few weeks, she had noticed an improvement in her energy level and was exercising more consistently.  In the past few weeks she has noticed intermittent numbness affecting her left leg especially with prolonged walking of over 15 minutes or jogging.  It subsides when she stretches.  She has not had any sustained numbness or one-sided symptoms.   The patient's allergies, current medications, family  history, past medical history, past social history, past surgical history and problem list were reviewed and updated as appropriate.    Previously:    I saw her on 05/23/2020, at which time she reported doing fairly well, she was on Diamox 1 g twice daily.  She had recuperated fairly well after her stenting.   She had a recent follow-up visit with Dr. Manuella Ghazi on 09/04/2020 and I was able to review her visit note.  She was noted to have disc edema which had improved.  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   She also had a visit with Dr. Manuella Ghazi on 06/06/2020, at which time she  was noted to have improvement in her disc edema.   I saw her on 03/19/20, at which time we talked about her test results.  She was advised to increase her Diamox 500 mg in the morning and 1000 mg at night.  She had recently undergone a home sleep test.  She was supposed to have a cerebral angiogram in December 2021 and a subsequent follow-up with her ophthalmologist also in December 2021.     Her home sleep test from 03/18/2020 showed overall mild obstructive sleep apnea with an AHI of 7.8/h, O2 nadir of 84%.  Given her medical history, she was advised to start AutoPap therapy.   She had consultation with Dr. Estanislado Pandy in the interim and had a cerebral angiogram on 04/02/2020, I reviewed the results:   IMPRESSION: Bilateral severe decrease in caliber of the distal right transverse sinus/sigmoid sinus junction, and of the left distal transverse sinus/sigmoid sinus junction suggestive of high-grade stenosis. Endovascular procedure was advised.   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  The patient was therefore advised to increase her Diamox to 1000 mg twice daily per phone call.   She subsequently had a stent placement in the right transverse sinus and right proximal sigmoid sinus.     I first met her on 02/20/2020 as a referral from the emergency room. She had been sent to the ER after an abnormal eye examination which revealed bilateral significant papilledema. She had a elevated opening pressure during a lumbar puncture in the emergency room on 02/15/2020. She had also undergone MRI of the brain as well as MR orbits and MRV. She was advised to continue with Diamox, she reported headaches. She was also advised to seek consultation with interventional radiology to investigate bilateral distal transverse sinus stenoses.   She presented to the emergency room again on 02/22/2020 with severe headache. A lumbar puncture in the ER  was unsuccessful and she had a fluoroscopic guided lumbar puncture on 02/22/2020 which showed an opening pressure of 55, large-volume tap was done and she had a closing pressure of 18. She felt better. She had an interim evaluation with interventional radiology. She is scheduled early next month for a diagnostic arteriogram to allow evaluation of her venous drainage system and clarification of the suspected distal transverse sinus stenoses.     02/20/20: (She) was sent to the emergency room on 02/15/2020 after an eye examination.  She reported headaches and blurry vision.  Her ophthalmology exam revealed florid bilateral papilledema.  I reviewed emergency room records.  Ophthalmology records were also scanned in from her visit to [facility].  In the emergency room she had multiple scans including brain MRI with and without contrast, MR  orbits with and without contrast and MRV head with and without contrast.  I reviewed the results: IMPRESSION: 1. Technically limited exam due to extensive susceptibility artifact related to dental hardware. 2. Subtle bulging of the optic discs at the posterior aspects of both globes, left more prominent than right, consistent with papilledema. 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 Constellation of findings can be seen in the setting of idiopathic intracranial hypertension. Correlation with LP and opening pressures could be performed for further evaluation as warranted. 3. Otherwise normal MRI of the brain and orbits. No other acute abnormality identified. No definite evidence for acute optic neuritis. 4. Otherwise normal intracranial MRV. No evidence for dural sinus thrombosis.   He also had a lumbar puncture in the emergency room with an opening pressure elevated at 42.  Routine laboratory results were obtained including routine tests on her CSF which initially was blood-tinged with an RBC count of 973, WBC count of 7  with subsequent improvement in tube 4.  Protein in the CSF was 18, glucose was 67.   She was started on Diamox in the ER.   She reports that she has noticed nausea since starting the Diamox.  She is taking 500 mg twice daily.  She had occasional retching and brought up mucus.  She had improvement in her headache but now she has a pressure sensation and still has blurry vision and light sensitivity.  She has not noticed any new symptoms.  She has not made a follow-up appointment yet with her ophthalmologist, Dr. Tama High.  She was told to follow-up in 1 month.  She does not have any medication for nausea. She reports some snoring and sleep disruption and feels tired during the day.  He has never had a sleep study.  She lives with her 45-year-old daughter.  Her Epworth sleepiness score is 2 out of 24, fatigue severity score is 51 out of 63.  She goes to bed around 830 and rise time is between 6:15 AM and 7 AM.   She went to urgent care in early October around the eighth and was told she had a sinus and ear infection was treated with antibiotic treatment.  She went back to urgent care about a week later and was given a prescription for prednisone.   Her Past Medical History Is Significant For: Past Medical History:  Diagnosis Date   Allergy    Anxiety    Asthma    seasonal- allergy   GERD (gastroesophageal reflux disease)    "some times"   HELLP syndrome    post parteum   PCOS (polycystic ovarian syndrome)    Sleep apnea     Her Past Surgical History Is Significant For: Past Surgical History:  Procedure Laterality Date   IR ANGIO INTRA EXTRACRAN SEL COM CAROTID INNOMINATE BILAT MOD SED  04/02/2020   IR ANGIO INTRA EXTRACRAN SEL COM CAROTID INNOMINATE UNI R MOD SED  05/08/2020   IR ANGIO VERTEBRAL SEL VERTEBRAL BILAT MOD SED  04/02/2020   IR CT HEAD LTD  05/08/2020   IR RADIOLOGIST EVAL & MGMT  02/28/2020   IR RADIOLOGIST EVAL & MGMT  05/29/2020   IR TRANSCATH PLC STENT  INITIAL VEIN   INC ANGIOPLASTY  05/08/2020   IR VENO/JUGULAR RIGHT  05/08/2020   RADIOLOGY WITH ANESTHESIA N/A 05/01/2020   Procedure: STENTING;  Surgeon: Luanne Bras, MD;  Location: [facility];  Service: Radiology;  Laterality: N/A;   RADIOLOGY WITH ANESTHESIA N/A 05/08/2020  Procedure: STENT PLACEMENT;  Surgeon: Luanne Bras, MD;  Location: [facility];  Service: Radiology;  Laterality: N/A;   WISDOM TOOTH EXTRACTION      Her Family History Is Significant For: Family History  Problem Relation Age of Onset   Asthma Father    Hypertension Father     Her Social History Is Significant For: Social History   Socioeconomic History   Marital status: Single    Spouse name: Not on file   Number of children: 1   Years of education: Not on file   Highest education level: Not on file  Occupational History   Not on file  Tobacco Use   Smoking status: Never   Smokeless tobacco: Never  Vaping Use   Vaping Use: Never used  Substance and Sexual Activity   Alcohol use: Not Currently    Comment: occ   Drug use: No   Sexual activity: Yes    Partners: Male    Birth control/protection: Condom  Other Topics Concern   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   aspirin 325 MG tablet Take 325 mg by mouth daily.   b complex vitamins capsule Take 1 capsule by  mouth daily.   busPIRone (BUSPAR) 7.5 MG tablet Take 1 tablet (7.5 mg total) by mouth 2 (two) times daily.   cetirizine (ZYRTEC) 10 MG tablet Take 10 mg by mouth daily as needed for allergies.   fluticasone (FLONASE) 50 MCG/ACT nasal spray Place 2 sprays into both nostrils daily.   levonorgestrel (MIRENA, 52 MG,) 20 MCG/24HR IUD 1 each by Intrauterine route once.    Polyethylene Glycol 400 (BLINK TEARS OP) Place 1 drop into both eyes daily as needed (dry eyes).   sertraline (ZOLOFT) 50 MG tablet Take 1 tablet (50 mg total) by mouth daily.   acetaminophen (TYLENOL) 500 MG tablet Take 1,000 mg by mouth every 6 (six) hours as needed for moderate pain. (Patient not taking: Reported on 01/27/2021)   clopidogrel (PLAVIX) 75 MG tablet Take 1 tablet (75 mg total) by mouth daily. (Patient not taking: Reported on 01/27/2021)   No facility-administered encounter medications on file as of 01/27/2021.  :  Review of Systems:  Out of a complete 14 point review of systems, all are reviewed and negative with the exception of these symptoms as listed below:  Review of Systems  Neurological:        Pt is here for follow up visit . Pt is no longer wearing CPAP. Pt states she did wear some last week. But has not been consistent . Pt states she is no able to sleep well with CPAP.     Objective:  Neurological Exam  Physical Exam Physical Examination:   Vitals:   01/27/21 0842  BP: 136/90  Pulse: 74   General Examination: The patient is a very pleasant 35 y.o. female in no acute distress. She appears well-developed and well-nourished and well groomed.   HEENT: Normocephalic, atraumatic, pupils are equal, round and reactive to light, no photophobia, no obvious papilledema on funduscopic exam.  I am wondering if she has the beginning of mild cataracts bilaterally?,  Extraocular tracking is well-preserved, face is symmetric with normal facial animation and normal facial sensation to light touch, temperature and  vibration.  Hearing is grossly intact.Airway examination reveals benign and stable findings.  Tongue protrudes centrally and palate elevates symmetrically.    Chest: Clear to auscultation without wheezing, rhonchi or crackles noted.   Heart: S1+S2+0, regular and normal without murmurs, rubs or gallops noted.    Abdomen: Soft, non-tender and non-distended.  Normal bowel sounds on auscultation.   Extremities: There is no pitting edema in the distal lower extremities bilaterally.    Skin: Warm and dry without trophic changes noted.   Musculoskeletal: exam reveals no obvious joint deformities, tenderness or joint swelling or erythema.    Neurologically:  Mental status: The patient is awake, alert and oriented in all 4 spheres. Her immediate and remote memory, attention, language skills and fund of knowledge are appropriate. There is no evidence of aphasia, agnosia, apraxia or anomia. Speech is clear with normal prosody and enunciation. Thought process is linear. Mood is normal and affect is normal.  Cranial nerves II - XII are as described above under HEENT exam. In addition: shoulder shrug is normal with equal shoulder height noted. Motor exam: Normal bulk, strength and tone is noted. There is no drift, tremor or rebound. Romberg is negative. Reflexes are 1+ throughout. Fine motor skills and coordination: intact with normal finger taps, normal hand movements, normal rapid alternating patting, normal foot taps and normal foot agility.  Cerebellar testing: No dysmetria or intention tremor on finger to nose testing. Heel to shin is unremarkable bilaterally. There is no truncal or gait ataxia.  Sensory exam: intact to light touch, temperature and vibration sense in the upper and lower extremities.  Gait, station and balance: She stands easily. No veering to one side is noted. No leaning to one side is noted. Posture is age-appropriate and stance is narrow based. Gait shows normal stride length and  normal pace. No problems turning are noted. Tandem walk is unremarkable.    Assessment and Plan:    In summary, Brenda Molina is a very pleasant 35 year old female  with an underlying medical history of PCOS, anxiety, allergies, and severe obesity with a BMI of over 40, who presents for follow-up consultation of her idiopathic intracranial hypertension, and mild obstructive sleep apnea, after starting AutoPap therapy in August.   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  She went back to the ER on 02/22/2020 and had a large-volume tap with an opening pressure highly elevated at 55 cm.  She felt better after the lumbar puncture.  She had been on Diamox 500 mg twice daily since October 2021.  She was advised to increase this to 1 pill in the morning and 2 at bedtime in November 2021 and in December after her checkup with the ophthalmologist she was encouraged to increase the Diamox to 1000 mg twice daily as her papilledema had become worse.  She feels stable in that regard, she has an upcoming appointment next month with her ophthalmologist for recheck and also reports that she is due  for her routine eye exam for eyeglasses.   She had an interim home sleep test that showed mild obstructive sleep apnea.  She is struggling with the AutoPap.  She is particularly uncomfortable with a full facemask but is not sure if she can keep her mouth closed with a nasal interface.  She was provided with a sample P 10 mask size medium today.  She is advised to ask for a chinstrap if the need arises from her DME company.  She is willing to continue to try AutoPap therapy.  She had noticed some improvement in her energy level when she was little bit more consistent with the usage.  She is encouraged to be compliant with AutoPap therapy and keep her appointment next month with our nurse practitioner.  We will hopefully also have a report from her ophthalmologist at the time.   She is advised to continue with Diamox, her prescription is up-to-date.  She is advised to call us with any interim questions or concerns.  This was an extended visit of over 40 minutes.  She is advised to call the weight management clinic for an appointment or an update on the wait list. I answered all her questions today and she was in agreement with the plan.   I spent 40 minutes in total face-to-face time and in reviewing records during pre-charting, more than 50% of which was spent in counseling and coordination of care, reviewing test results, reviewing medications and treatment regimen and/or in discussing or reviewing the diagnosis of OSA, IIH, weight management, the prognosis and treatment options. Pertinent laboratory and imaging test results that were available during this visit with the patient were reviewed by me and considered in my medical decision making (see chart for details).

## 2021-01-27 NOTE — Patient Instructions (Signed)
Please continue using your autoPAP regularly. While your insurance requires that you use PAP at least 4 hours each night on 70% of the nights, I recommend, that you not skip any nights and use it throughout the night if you can. Getting used to PAP and staying with the treatment long term does take time and patience and discipline. Untreated obstructive sleep apnea when it is moderate to severe can have an adverse impact on cardiovascular health and raise her risk for heart disease, arrhythmias, hypertension, congestive heart failure, stroke and diabetes. Untreated obstructive sleep apnea causes sleep disruption, nonrestorative sleep, and sleep deprivation. This can have an impact on your day to day functioning and cause daytime sleepiness and impairment of cognitive function, memory loss, mood disturbance, and problems focussing. Using PAP regularly can improve these symptoms.  You are not quite compliant with treatment, try the nasal pillows you were provided today, let us know how else we can help and keep your appointment next month with your ophthalmologist as well as your appointment with Amy later in November.

## 2021-01-30 DIAGNOSIS — G4733 Obstructive sleep apnea (adult) (pediatric): Secondary | ICD-10-CM | POA: Diagnosis not present

## 2021-02-06 DIAGNOSIS — Z0100 Encounter for examination of eyes and vision without abnormal findings: Secondary | ICD-10-CM | POA: Diagnosis not present

## 2021-02-06 DIAGNOSIS — H5213 Myopia, bilateral: Secondary | ICD-10-CM | POA: Diagnosis not present

## 2021-02-16 DIAGNOSIS — Z0100 Encounter for examination of eyes and vision without abnormal findings: Secondary | ICD-10-CM | POA: Diagnosis not present

## 2021-02-27 DIAGNOSIS — G4733 Obstructive sleep apnea (adult) (pediatric): Secondary | ICD-10-CM | POA: Diagnosis not present

## 2021-02-28 DIAGNOSIS — J101 Influenza due to other identified influenza virus with other respiratory manifestations: Secondary | ICD-10-CM | POA: Diagnosis not present

## 2021-03-02 DIAGNOSIS — G4733 Obstructive sleep apnea (adult) (pediatric): Secondary | ICD-10-CM | POA: Diagnosis not present

## 2021-03-18 NOTE — Patient Instructions (Signed)
Below is our plan:  We will continue acetazolamide  1000mg  tweice daily. Get updated eye exam and have them send a copy. Continue weight management. Consider melatonin 5-10mg  at bedtime. Work on consistent sleep schedule. Focus on meeting 70% compliance with CPAP.   Please make sure you are staying well hydrated. I recommend 50-60 ounces daily. Well balanced diet and regular exercise encouraged. Consistent sleep schedule with 6-8 hours recommended.   Please continue follow up with care team as directed.   Follow up with me in 4 months   You may receive a survey regarding today's visit. I encourage you to leave honest feed back as I do use this information to improve patient care. Thank you for seeing me today!    Please continue using your CPAP regularly. While your insurance requires that you use CPAP at least 4 hours each night on 70% of the nights, I recommend, that you not skip any nights and use it throughout the night if you can. Getting used to CPAP and staying with the treatment long term does take time and patience and discipline. Untreated obstructive sleep apnea when it is moderate to severe can have an adverse impact on cardiovascular health and raise her risk for heart disease, arrhythmias, hypertension, congestive heart failure, stroke and diabetes. Untreated obstructive sleep apnea causes sleep disruption, nonrestorative sleep, and sleep deprivation. This can have an impact on your day to day functioning and cause daytime sleepiness and impairment of cognitive function, memory loss, mood disturbance, and problems focussing. Using CPAP regularly can improve these symptoms.

## 2021-03-18 NOTE — Progress Notes (Signed)
Chief Complaint  Patient presents with   Follow-up    Pt alone, rm 11. Overall doing ok. Stable.      HISTORY OF PRESENT ILLNESS:  03/24/21 ALL:  Brenda Molina is a 35 y.o. female here today for follow up for IIH and OSA on CPAP. She continues acetazolamide 1000mg  BID.  She is not having many headaches. She does describe an occasional episode of blurry vision of the right eye. Sometimes right eye will feel "different" than the left. She is unable to give me a description of what it feels like. She feels that the right eye is "hard". She is scheduled for updated eye exam 03/27/2021.  HST 02/2020 showed mild OSA with AHI 7.3/hr and O2 nadir 84%. She started PAP therapy in 11/2020. She was having a hard time tolerating FFM. Given sample P10 mask but did not like it. She has switched to a nasal pillow and feels it is working better. She does note that she feels better when using CPAP. She admits that she continues to remember to use it. She notices more energy when she uses it. She does not have consistent sleep routine.      HISTORY (copied from Dr Teofilo Pod previous note)  Brenda Molina is a 35 year old right-handed woman with an underlying medical history of PCOS, anxiety, allergies, and severe obesity with a BMI of over 40, who presents for follow-up consultation of her idiopathic intracranial hypertension and sleep apnea, after starting autoPAP therapy. The patient is unaccompanied today. I last saw her on 09/24/2020, at which time she felt stable.  She had not started AutoPap therapy yet.  She was working on weight loss and had been referred to healthy weight and wellness but had not made an appointment yet.  She was supposed to see her ophthalmologist in November 2022.    She was set up with AutoPap therapy in the interim on 11/30/2020.  She has an Economist.   Today, 01/27/2021: I reviewed her AutoPap compliance data from 12/01/2020 through 12/30/2020, which is a total of 30 days, during  which time she used her machine 17 days with percent use days greater than 4 hours at 10% only, indicating significantly no compliance, average usage of 2.1 hours for days on treatment, average AHI at goal at 0.4/h, 95th percentile of pressure at 5.6 cm with a range of 5 to 11 cm, leak on the low side with a 95th percentile at 5.5 L/min.  She has used her machine very sporadically in the past month.  She was a little bit more consistent in the first month.  She indicates difficulty tolerating the mask, she started off with a medium F 20 fullface mask but now is on a large because she felt it was more comfortable with a larger size but the large looks like it is too large for her when she tried it on in the office today.  She reports that she does open her mouth at night.  She is still quite uncomfortable with the mask but not so much with the pressure, she is able to tolerate the pressure.  She feels like she has to sleep a little bit more upright since starting the AutoPap.  She is not ready to give it up quite yet but is still struggling with it.  She is willing to try a nasal interface.  She was provided with a sample medium P 10 nasal pillows interface.  She reports that she has not made  her appointment with the weight loss clinic yet.  She called the wrong place which was a bariatric clinic and has not actually called the medical weight loss clinic yet.  She does know that there is a wait list.  She reports that her weight has been fluctuating, no new symptoms as far as her vision goes and she has an appointment next month with her ophthalmologist but is also due for her regular eye appointment.  She has not been using her prescription eyeglasses consistently.  She continues to take her Diamox.  She has an appointment next month in our clinic for routine follow-up as well.  She indicates that when she was using her machine a little bit more consistently in the first few weeks, she had noticed an improvement in  her energy level and was exercising more consistently.  In the past few weeks she has noticed intermittent numbness affecting her left leg especially with prolonged walking of over 15 minutes or jogging.  It subsides when she stretches.  She has not had any sustained numbness or one-sided symptoms.   REVIEW OF SYSTEMS: Out of a complete 14 system review of symptoms, the patient complains only of the following symptoms, blurred vision, fatigue and all other reviewed systems are negative.   ALLERGIES: No Known Allergies   HOME MEDICATIONS: Outpatient Medications Prior to Visit  Medication Sig Dispense Refill   acetaZOLAMIDE (DIAMOX) 500 MG capsule Take 2 pills twice per day 360 capsule 3   ascorbic acid (VITAMIN C) 500 MG tablet Take 500 mg by mouth daily.     aspirin 325 MG tablet Take 325 mg by mouth daily.     b complex vitamins capsule Take 1 capsule by mouth daily.     cetirizine (ZYRTEC) 10 MG tablet Take 10 mg by mouth daily as needed for allergies.     fluticasone (FLONASE) 50 MCG/ACT nasal spray Place 2 sprays into both nostrils daily. 16 g 6   levonorgestrel (MIRENA, 52 MG,) 20 MCG/24HR IUD 1 each by Intrauterine route once.      Polyethylene Glycol 400 (BLINK TEARS OP) Place 1 drop into both eyes daily as needed (dry eyes).     acetaminophen (TYLENOL) 500 MG tablet Take 1,000 mg by mouth every 6 (six) hours as needed for moderate pain. (Patient not taking: Reported on 01/27/2021)     busPIRone (BUSPAR) 7.5 MG tablet Take 1 tablet (7.5 mg total) by mouth 2 (two) times daily. 180 tablet 1   clopidogrel (PLAVIX) 75 MG tablet Take 1 tablet (75 mg total) by mouth daily. (Patient not taking: Reported on 01/27/2021) 4 tablet 0   sertraline (ZOLOFT) 50 MG tablet Take 1 tablet (50 mg total) by mouth daily. 90 tablet 1   No facility-administered medications prior to visit.     PAST MEDICAL HISTORY: Past Medical History:  Diagnosis Date   Allergy    Anxiety    Asthma    seasonal-  allergy   GERD (gastroesophageal reflux disease)    "some times"   HELLP syndrome    post parteum   PCOS (polycystic ovarian syndrome)    Sleep apnea      PAST SURGICAL HISTORY: Past Surgical History:  Procedure Laterality Date   IR ANGIO INTRA EXTRACRAN SEL COM CAROTID INNOMINATE BILAT MOD SED  04/02/2020   IR ANGIO INTRA EXTRACRAN SEL COM CAROTID INNOMINATE UNI R MOD SED  05/08/2020   IR ANGIO VERTEBRAL SEL VERTEBRAL BILAT MOD SED  04/02/2020   IR  CT HEAD LTD  05/08/2020   IR RADIOLOGIST EVAL & MGMT  02/28/2020   IR RADIOLOGIST EVAL & MGMT  05/29/2020   IR TRANSCATH PLC STENT  INITIAL VEIN  INC ANGIOPLASTY  05/08/2020   IR VENO/JUGULAR RIGHT  05/08/2020   RADIOLOGY WITH ANESTHESIA N/A 05/01/2020   Procedure: STENTING;  Surgeon: Julieanne Cotton, MD;  Location: [facility] OR;  Service: Radiology;  Laterality: N/A;   RADIOLOGY WITH ANESTHESIA N/A 05/08/2020   Procedure: STENT PLACEMENT;  Surgeon: Julieanne Cotton, MD;  Location[facility];  Service: Radiology;  Laterality: N/A;   WISDOM TOOTH EXTRACTION       FAMILY HISTORY: Family History  Problem Relation Age of Onset   Asthma Father    Hypertension Father      SOCIAL HISTORY: Social History   Socioeconomic History   Marital status: Single    Spouse name: Not on file   Number of children: 1   Years of education: Not on file   Highest education level: Not on file  Occupational History   Not on file  Tobacco Use   Smoking status: Never   Smokeless tobacco: Never  Vaping Use   Vaping Use: Never used  Substance and Sexual Activity   Alcohol use: Not Currently    Comment: occ   Drug use: No   Sexual activity: Yes    Partners: Male    Birth control/protection: Condom  Other Topics Concern   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 Has no children. 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  Generalized: Well developed, in no acute distress  Cardiology: normal rate and rhythm, no murmur auscultated  Respiratory: clear to auscultation bilaterally    Neurological examination  Mentation: Alert oriented to time, place, history taking. Follows all commands speech and language fluent Cranial nerve II-XII: Pupils were equal round reactive to light. Extraocular movements were full, visual field were full on confrontational test. Facial sensation and strength were normal. Head turning and shoulder shrug  were normal and symmetric. Motor: The motor testing reveals 5 over 5 strength of all 4 extremities. Good symmetric motor tone is noted throughout.  Gait and station: Gait is normal.    DIAGNOSTIC DATA (LABS, IMAGING, TESTING) - I reviewed patient records, labs, notes, testing and imaging myself where available.  Lab Results  Component Value Date   WBC 7.8 05/08/2020   HGB 12.8 05/08/2020   HCT 40.0 05/08/2020   MCV 76.3 (L) 05/08/2020   PLT 260 05/08/2020      Component Value Date/Time   NA 141 06/21/2020 1637   K 3.5 06/21/2020 1637   CL 101 06/21/2020 1637   CO2 23 06/21/2020 1637   GLUCOSE 104 (H) 06/21/2020 1637   GLUCOSE 124 (H) 05/08/2020 0700   BUN 6 06/21/2020 1637   CREATININE 0.89 06/21/2020 1637   CREATININE 0.83 11/01/2014 1534  CALCIUM 9.6 06/21/2020 1637   PROT 5.6 (L) 06/09/2015 1848   ALBUMIN 3.1 (L) 06/09/2015 1848   AST 223 (H) 06/09/2015 1848   ALT 458 (H) 06/09/2015 1848   ALKPHOS 154 (H) 06/09/2015 1848   BILITOT 1.2 06/09/2015 1848   GFRNONAA 84 06/21/2020 1637    GFRNONAA >60 05/08/2020 0700   GFRAA 97 06/21/2020 1637   Lab Results  Component Value Date   CHOL 214 (H) 01/17/2014   HDL 77 01/17/2014   LDLCALC 126 (H) 01/17/2014   TRIG 55 01/17/2014   CHOLHDL 2.8 01/17/2014   Lab Results  Component Value Date   HGBA1C 5.8 (H) 06/21/2020   No results found for: [id number] Lab Results  Component Value Date   TSH 1.100 02/06/2019    No flowsheet data found.   No flowsheet data found.   ASSESSMENT AND PLAN  35 y.o. year old female  has a past medical history of Allergy, Anxiety, Asthma, GERD (gastroesophageal reflux disease), HELLP syndrome, PCOS (polycystic ovarian syndrome), and Sleep apnea. here with    IIH (idiopathic intracranial hypertension)  OSA on CPAP  Mild obstructive sleep apnea  Difficulty using continuous positive airway pressure (CPAP) device  Brenda Molina is tolerating acetazolamide well and reports having very little headaches. We will continue mg twice daily. I have encouraged her to update eye exam. She is having intermittent blurred vision of right eye. No associated headaches or vision loss. She will have Dr Sherryll Burger send [place] a report of findings. I have encouraged her to focus on healthy lifestyle habits. Sleep hygiene reviewed. She may consider melatonin 5mg  at bedtime. Weight management encouraged. Compliance report reveals sub optimal compliance. She does note improvement in energy levels when using CPAP but admits that she forgets to start therapy at night. She was advised to focus on using CPAP every night for at least 4 hours. I will have her follow up with me in 4 months. She verbalizes understanding and agreement with this plan.    No orders of the defined types were placed in this encounter.    No orders of the defined types were placed in this encounter.     , MSN, FNP-C 03/24/2021, 10:05 AM  [facility] [address] [place], [place] [phone]

## 2021-03-24 ENCOUNTER — Ambulatory Visit (INDEPENDENT_AMBULATORY_CARE_PROVIDER_SITE_OTHER): Payer: 59 | Admitting: Family Medicine

## 2021-03-24 ENCOUNTER — Encounter: Payer: Self-pay | Admitting: Family Medicine

## 2021-03-24 VITALS — BP 137/91 | HR 92 | Ht 63.0 in | Wt 243.0 lb

## 2021-03-24 DIAGNOSIS — Z789 Other specified health status: Secondary | ICD-10-CM | POA: Diagnosis not present

## 2021-03-24 DIAGNOSIS — G4733 Obstructive sleep apnea (adult) (pediatric): Secondary | ICD-10-CM | POA: Diagnosis not present

## 2021-03-24 DIAGNOSIS — G932 Benign intracranial hypertension: Secondary | ICD-10-CM | POA: Diagnosis not present

## 2021-03-24 DIAGNOSIS — Z9989 Dependence on other enabling machines and devices: Secondary | ICD-10-CM

## 2021-03-27 DIAGNOSIS — G932 Benign intracranial hypertension: Secondary | ICD-10-CM | POA: Diagnosis not present

## 2021-03-29 DIAGNOSIS — G4733 Obstructive sleep apnea (adult) (pediatric): Secondary | ICD-10-CM | POA: Diagnosis not present

## 2021-04-01 DIAGNOSIS — G4733 Obstructive sleep apnea (adult) (pediatric): Secondary | ICD-10-CM | POA: Diagnosis not present

## 2021-04-29 DIAGNOSIS — G4733 Obstructive sleep apnea (adult) (pediatric): Secondary | ICD-10-CM | POA: Diagnosis not present

## 2021-05-02 DIAGNOSIS — G4733 Obstructive sleep apnea (adult) (pediatric): Secondary | ICD-10-CM | POA: Diagnosis not present

## 2021-06-02 ENCOUNTER — Other Ambulatory Visit (HOSPITAL_COMMUNITY): Payer: Self-pay | Admitting: Interventional Radiology

## 2021-06-02 ENCOUNTER — Telehealth (HOSPITAL_COMMUNITY): Payer: Self-pay

## 2021-06-02 DIAGNOSIS — H93A1 Pulsatile tinnitus, right ear: Secondary | ICD-10-CM

## 2021-06-02 DIAGNOSIS — G4733 Obstructive sleep apnea (adult) (pediatric): Secondary | ICD-10-CM | POA: Diagnosis not present

## 2021-06-02 NOTE — Telephone Encounter (Signed)
Called to schedule mrv, no answer, left vm. AW  

## 2021-06-30 DIAGNOSIS — G4733 Obstructive sleep apnea (adult) (pediatric): Secondary | ICD-10-CM | POA: Diagnosis not present

## 2021-07-21 NOTE — Progress Notes (Deleted)
? ? ?No chief complaint on file. ? ? ? ?HISTORY OF PRESENT ILLNESS: ? ?07/21/21 ALL:  ?Brenda Molina returns for follow up for IIH and OSA on CPAP.  ? ?03/24/2021 ALL: ?Brenda Molina is a 36 y.o. female here today for follow up for IIH and OSA on CPAP. She continues acetazolamide 1000mg  BID.  She is not having many headaches. She does describe an occasional episode of blurry vision of the right eye. Sometimes right eye will feel "different" than the left. She is unable to give me a description of what it feels like. She feels that the right eye is "hard". She is scheduled for updated eye exam 03/27/2021. ? ?HST 02/2020 showed mild OSA with AHI 7.3/hr and O2 nadir 84%. She started PAP therapy in 11/2020. She was having a hard time tolerating FFM. Given sample P10 mask but did not like it. She has switched to a nasal pillow and feels it is working better. She does note that she feels better when using CPAP. She admits that she continues to remember to use it. She notices more energy when she uses it. She does not have consistent sleep routine.  ? ? ? ? ?HISTORY (copied from Dr 12/2020 previous note) ? ?Brenda Molina is a 36 year old right-handed woman with an underlying medical history of PCOS, anxiety, allergies, and severe obesity with a BMI of over 40, who presents for follow-up consultation of her idiopathic intracranial hypertension and sleep apnea, after starting autoPAP therapy. The patient is unaccompanied today. I last saw her on 09/24/2020, at which time she felt stable.  She had not started AutoPap therapy yet.  She was working on weight loss and had been referred to healthy weight and wellness but had not made an appointment yet.  She was supposed to see her ophthalmologist in November 2022.  ?  ?She was set up with AutoPap therapy in the interim on 11/30/2020.  She has an 01/30/2021. ?  ?Today, 01/27/2021: I reviewed her AutoPap compliance data from 12/01/2020 through 12/30/2020, which is a total of 30 days, during  which time she used her machine 17 days with percent use days greater than 4 hours at 10% only, indicating significantly no compliance, average usage of 2.1 hours for days on treatment, average AHI at goal at 0.4/h, 95th percentile of pressure at 5.6 cm with a range of 5 to 11 cm, leak on the low side with a 95th percentile at 5.5 L/min.  She has used her machine very sporadically in the past month.  She was a little bit more consistent in the first month.  She indicates difficulty tolerating the mask, she started off with a medium F 20 fullface mask but now is on a large because she felt it was more comfortable with a larger size but the large looks like it is too large for her when she tried it on in the office today.  She reports that she does open her mouth at night.  She is still quite uncomfortable with the mask but not so much with the pressure, she is able to tolerate the pressure.  She feels like she has to sleep a little bit more upright since starting the AutoPap.  She is not ready to give it up quite yet but is still struggling with it.  She is willing to try a nasal interface.  She was provided with a sample medium P 10 nasal pillows interface.  She reports that she has not made her appointment  with the weight loss clinic yet.  She called the wrong place which was a bariatric clinic and has not actually called the medical weight loss clinic yet.  She does know that there is a wait list.  She reports that her weight has been fluctuating, no new symptoms as far as her vision goes and she has an appointment next month with her ophthalmologist but is also due for her regular eye appointment.  She has not been using her prescription eyeglasses consistently.  She continues to take her Diamox.  She has an appointment next month in our clinic for routine follow-up as well.  She indicates that when she was using her machine a little bit more consistently in the first few weeks, she had noticed an improvement in  her energy level and was exercising more consistently.  In the past few weeks she has noticed intermittent numbness affecting her left leg especially with prolonged walking of over 15 minutes or jogging.  It subsides when she stretches.  She has not had any sustained numbness or one-sided symptoms. ? ? ?REVIEW OF SYSTEMS: Out of a complete 14 system review of symptoms, the patient complains only of the following symptoms, blurred vision, fatigue and all other reviewed systems are negative. ? ? ?ALLERGIES: ?No Known Allergies ? ? ?HOME MEDICATIONS: ?Outpatient Medications Prior to Visit  ?Medication Sig Dispense Refill  ? acetaZOLAMIDE (DIAMOX) 500 MG capsule Take 2 pills twice per day 360 capsule 3  ? ascorbic acid (VITAMIN C) 500 MG tablet Take 500 mg by mouth daily.    ? aspirin 325 MG tablet Take 325 mg by mouth daily.    ? b complex vitamins capsule Take 1 capsule by mouth daily.    ? cetirizine (ZYRTEC) 10 MG tablet Take 10 mg by mouth daily as needed for allergies.    ? fluticasone (FLONASE) 50 MCG/ACT nasal spray Place 2 sprays into both nostrils daily. 16 g 6  ? levonorgestrel (MIRENA, 52 MG,) 20 MCG/24HR IUD 1 each by Intrauterine route once.     ? Polyethylene Glycol 400 (BLINK TEARS OP) Place 1 drop into both eyes daily as needed (dry eyes).    ? ?No facility-administered medications prior to visit.  ? ? ? ?PAST MEDICAL HISTORY: ?Past Medical History:  ?Diagnosis Date  ? Allergy   ? Anxiety   ? Asthma   ? seasonal- allergy  ? GERD (gastroesophageal reflux disease)   ? "some times"  ? HELLP syndrome   ? post parteum  ? PCOS (polycystic ovarian syndrome)   ? Sleep apnea   ? ? ? ?PAST SURGICAL HISTORY: ?Past Surgical History:  ?Procedure Laterality Date  ? IR ANGIO INTRA EXTRACRAN SEL COM CAROTID INNOMINATE BILAT MOD SED  04/02/2020  ? IR ANGIO INTRA EXTRACRAN SEL COM CAROTID INNOMINATE UNI R MOD SED  05/08/2020  ? IR ANGIO VERTEBRAL SEL VERTEBRAL BILAT MOD SED  04/02/2020  ? IR CT HEAD LTD  05/08/2020  ? IR  RADIOLOGIST EVAL & MGMT  02/28/2020  ? IR RADIOLOGIST EVAL & MGMT  05/29/2020  ? IR TRANSCATH PLC STENT  INITIAL VEIN  INC ANGIOPLASTY  05/08/2020  ? IR VENO/JUGULAR RIGHT  05/08/2020  ? RADIOLOGY WITH ANESTHESIA N/A 05/01/2020  ? Procedure: STENTING;  Surgeon: Julieanne Cotton, MD;  Location: [facility] OR;  Service: Radiology;  Laterality: N/A;  ? RADIOLOGY WITH ANESTHESIA N/A 05/08/2020  ? Procedure: STENT PLACEMENT;  Surgeon: Julieanne Cotton, MD;  Location: [facility] OR;  Service: Radiology;  Laterality: N/A;  ?  WISDOM TOOTH EXTRACTION    ? ? ? ?FAMILY HISTORY: ?Family History  ?Problem Relation Age of Onset  ? Asthma Father   ? Hypertension Father   ? ? ? ?SOCIAL HISTORY: ?Social History  ? ?Socioeconomic History  ? Marital status: Single  ?  Spouse name: Not on file  ? Number of children: 1  ? Years of education: Not on file  ? Highest education level: Not on file  ?Occupational History  ? Not on file  ?Tobacco Use  ? Smoking status: Never  ? Smokeless tobacco: Never  ?Vaping Use  ? Vaping Use: Never used  ?Substance and Sexual Activity  ? Alcohol use: Not Currently  ?  Comment: occ  ? Drug use: No  ? Sexual activity: Yes  ?  Partners: Male  ?  Birth control/protection: Condom  ?Other Topics Concern  ? Not on file  ?Social History Narrative  ? 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 Has no children. 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 ?Social Determinants of Health  ? ?Financial Resource Strain: Not on file  ?Food Insecurity: Not on file  ?Transportation Needs: Not on file  ?Physical Activity: Not on file  ?Stress: Not on file  ?Social Connections: Not on file  ?Intimate Partner Violence: Not on file  ? ? ? ?PHYSICAL EXAM ? ?There were no vitals filed for this visit. ? ?There is no height or weight on file to calculate BMI. ? ?Generalized: Well developed, in no acute distress ? ?Cardiology: normal rate and rhythm, no murmur auscultated  ?Respiratory: clear to auscultation  bilaterally   ? ?Neurological examination  ?Mentation: Alert oriented to time, place, history taking. Follows all commands speech and language fluent ?Cranial nerve II-XII: Pupils were equal round reac

## 2021-07-22 ENCOUNTER — Ambulatory Visit: Payer: 59 | Admitting: Family Medicine

## 2021-07-31 DIAGNOSIS — G4733 Obstructive sleep apnea (adult) (pediatric): Secondary | ICD-10-CM | POA: Diagnosis not present

## 2021-08-30 DIAGNOSIS — G4733 Obstructive sleep apnea (adult) (pediatric): Secondary | ICD-10-CM | POA: Diagnosis not present

## 2021-09-15 ENCOUNTER — Telehealth (HOSPITAL_COMMUNITY): Payer: Self-pay

## 2021-09-15 NOTE — Telephone Encounter (Signed)
Called to schedule mrv, no answer, left vm. AW  

## 2021-10-17 ENCOUNTER — Ambulatory Visit (HOSPITAL_COMMUNITY): Payer: No Typology Code available for payment source | Attending: Interventional Radiology

## 2021-10-17 ENCOUNTER — Encounter (HOSPITAL_COMMUNITY): Payer: Self-pay

## 2021-11-04 NOTE — Progress Notes (Deleted)
No chief complaint on file.    HISTORY OF PRESENT ILLNESS:  11/04/21 ALL:  Brenda Molina returns for follow up for IIH and OSA on CPAP. She continues acetazolamide 1000mg  BID.   Eye exam?   03/24/2021 ALL:  Brenda Molina is a 36 y.o. female here today for follow up for IIH and OSA on CPAP. She continues acetazolamide 1000mg  BID.  She is not having many headaches. She does describe an occasional episode of blurry vision of the right eye. Sometimes right eye will feel "different" than the left. She is unable to give me a description of what it feels like. She feels that the right eye is "hard". She is scheduled for updated eye exam 03/27/2021.  HST 02/2020 showed mild OSA with AHI 7.3/hr and O2 nadir 84%. She started PAP therapy in 11/2020. She was having a hard time tolerating FFM. Given sample P10 mask but did not like it. She has switched to a nasal pillow and feels it is working better. She does note that she feels better when using CPAP. She admits that she continues to remember to use it. She notices more energy when she uses it. She does not have consistent sleep routine.      HISTORY (copied from Dr 03/2020 previous note)  Brenda Molina is a 36 year old right-handed woman with an underlying medical history of PCOS, anxiety, allergies, and severe obesity with a BMI of over 40, who presents for follow-up consultation of her idiopathic intracranial hypertension and sleep apnea, after starting autoPAP therapy. The patient is unaccompanied today. I last saw her on 09/24/2020, at which time she felt stable.  She had not started AutoPap therapy yet.  She was working on weight loss and had been referred to healthy weight and wellness but had not made an appointment yet.  She was supposed to see her ophthalmologist in November 2022.    She was set up with AutoPap therapy in the interim on 11/30/2020.  She has an December 2022.   Today, 01/27/2021: I reviewed her AutoPap compliance data from  12/01/2020 through 12/30/2020, which is a total of 30 days, during which time she used her machine 17 days with percent use days greater than 4 hours at 10% only, indicating significantly no compliance, average usage of 2.1 hours for days on treatment, average AHI at goal at 0.4/h, 95th percentile of pressure at 5.6 cm with a range of 5 to 11 cm, leak on the low side with a 95th percentile at 5.5 L/min.  She has used her machine very sporadically in the past month.  She was a little bit more consistent in the first month.  She indicates difficulty tolerating the mask, she started off with a medium F 20 fullface mask but now is on a large because she felt it was more comfortable with a larger size but the large looks like it is too large for her when she tried it on in the office today.  She reports that she does open her mouth at night.  She is still quite uncomfortable with the mask but not so much with the pressure, she is able to tolerate the pressure.  She feels like she has to sleep a little bit more upright since starting the AutoPap.  She is not ready to give it up quite yet but is still struggling with it.  She is willing to try a nasal interface.  She was provided with a sample medium P 10 nasal pillows interface.  She reports that she has not made her appointment with the weight loss clinic yet.  She called the wrong place which was a bariatric clinic and has not actually called the medical weight loss clinic yet.  She does know that there is a wait list.  She reports that her weight has been fluctuating, no new symptoms as far as her vision goes and she has an appointment next month with her ophthalmologist but is also due for her regular eye appointment.  She has not been using her prescription eyeglasses consistently.  She continues to take her Diamox.  She has an appointment next month in our clinic for routine follow-up as well.  She indicates that when she was using her machine a little bit more  consistently in the first few weeks, she had noticed an improvement in her energy level and was exercising more consistently.  In the past few weeks she has noticed intermittent numbness affecting her left leg especially with prolonged walking of over 15 minutes or jogging.  It subsides when she stretches.  She has not had any sustained numbness or one-sided symptoms.   REVIEW OF SYSTEMS: Out of a complete 14 system review of symptoms, the patient complains only of the following symptoms, blurred vision, fatigue and all other reviewed systems are negative.   ALLERGIES: No Known Allergies   HOME MEDICATIONS: Outpatient Medications Prior to Visit  Medication Sig Dispense Refill   acetaZOLAMIDE (DIAMOX) 500 MG capsule Take 2 pills twice per day 360 capsule 3   ascorbic acid (VITAMIN C) 500 MG tablet Take 500 mg by mouth daily.     aspirin 325 MG tablet Take 325 mg by mouth daily.     b complex vitamins capsule Take 1 capsule by mouth daily.     cetirizine (ZYRTEC) 10 MG tablet Take 10 mg by mouth daily as needed for allergies.     fluticasone (FLONASE) 50 MCG/ACT nasal spray Place 2 sprays into both nostrils daily. 16 g 6   levonorgestrel (MIRENA, 52 MG,) 20 MCG/24HR IUD 1 each by Intrauterine route once.      Polyethylene Glycol 400 (BLINK TEARS OP) Place 1 drop into both eyes daily as needed (dry eyes).     No facility-administered medications prior to visit.     PAST MEDICAL HISTORY: Past Medical History:  Diagnosis Date   Allergy    Anxiety    Asthma    seasonal- allergy   GERD (gastroesophageal reflux disease)    "some times"   HELLP syndrome    post parteum   PCOS (polycystic ovarian syndrome)    Sleep apnea      PAST SURGICAL HISTORY: Past Surgical History:  Procedure Laterality Date   IR ANGIO INTRA EXTRACRAN SEL COM CAROTID INNOMINATE BILAT MOD SED  04/02/2020   IR ANGIO INTRA EXTRACRAN SEL COM CAROTID INNOMINATE UNI R MOD SED  05/08/2020   IR ANGIO VERTEBRAL SEL  VERTEBRAL BILAT MOD SED  04/02/2020   IR CT HEAD LTD  05/08/2020   IR RADIOLOGIST EVAL & MGMT  02/28/2020   IR RADIOLOGIST EVAL & MGMT  05/29/2020   IR TRANSCATH PLC STENT  INITIAL VEIN  INC ANGIOPLASTY  05/08/2020   IR VENO/JUGULAR RIGHT  05/08/2020   RADIOLOGY WITH ANESTHESIA N/A 05/01/2020   Procedure: STENTING;  Surgeon: Julieanne Cotton, MD;  Location: [facility];  Service: Radiology;  Laterality: N/A;   RADIOLOGY WITH ANESTHESIA N/A 05/08/2020   Procedure: STENT PLACEMENT;  Surgeon: Julieanne Cotton, MD;  Location: [facility];  Service: Radiology;  Laterality: N/A;   WISDOM TOOTH EXTRACTION       FAMILY HISTORY: Family History  Problem Relation Age of Onset   Asthma Father    Hypertension Father      SOCIAL HISTORY: Social History   Socioeconomic History   Marital status: Single    Spouse name: Not on file   Number of children: 1   Years of education: Not on file   Highest education level: Not on file  Occupational History   Not on file  Tobacco Use   Smoking status: Never   Smokeless tobacco: Never  Vaping Use   Vaping Use: Never used  Substance and Sexual Activity   Alcohol use: Not Currently    Comment: occ   Drug use: No   Sexual activity: Yes    Partners: Male    Birth control/protection: Condom  Other Topics Concern   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  There is no height or weight on file to calculate BMI.  Generalized: Well developed, in no acute distress  Cardiology: normal rate  and rhythm, no murmur auscultated  Respiratory: clear to auscultation bilaterally    Neurological examination  Mentation: Alert oriented to time, place, history taking. Follows all commands speech and language fluent Cranial nerve II-XII: Pupils were equal round reactive to light. Extraocular movements were full, visual field were full on confrontational test. Facial sensation and strength were normal. Head turning and shoulder shrug  were normal and symmetric. Motor: The motor testing reveals 5 over 5 strength of all 4 extremities. Good symmetric motor tone is noted throughout.  Gait and station: Gait is normal.    DIAGNOSTIC DATA (LABS, IMAGING, TESTING) - I reviewed patient records, labs, notes, testing and imaging myself where available.  Lab Results  Component Value Date   WBC 7.8 05/08/2020   HGB 12.8 05/08/2020   HCT 40.0 05/08/2020   MCV 76.3 (L) 05/08/2020   PLT 260 05/08/2020      Component Value Date/Time   NA 141 06/21/2020 1637   K 3.5 06/21/2020 1637   CL 101 06/21/2020 1637   CO2 23 06/21/2020 1637   GLUCOSE 104 (H) 06/21/2020 1637   GLUCOSE 124 (H) 05/08/2020 0700   BUN 6 06/21/2020 1637   CREATININE 0.89 06/21/2020 1637   CREATININE 0.83 11/01/2014 1534   CALCIUM 9.6 06/21/2020 1637   PROT 5.6 (L) 06/09/2015 1848   ALBUMIN 3.1 (L) 06/09/2015 1848   AST 223 (H) 06/09/2015 1848   ALT 458 (H) 06/09/2015 1848   ALKPHOS 154 (H) 06/09/2015 1848   BILITOT 1.2 06/09/2015 1848   GFRNONAA 84 06/21/2020 1637   GFRNONAA >60 05/08/2020 0700   GFRAA 97 06/21/2020 1637   Lab Results  Component Value Date   CHOL 214 (H) 01/17/2014   HDL 77 01/17/2014   LDLCALC 126 (H) 01/17/2014   TRIG 55 01/17/2014   CHOLHDL 2.8 01/17/2014  Lab Results  Component Value Date   HGBA1C 5.8 (H) 06/21/2020   No results found for: "VITAMINB12" Lab Results  Component Value Date   TSH 1.100 02/06/2019        No data to display               No data to display            ASSESSMENT AND PLAN  36 y.o. year old female  has a past medical history of Allergy, Anxiety, Asthma, GERD (gastroesophageal reflux disease), HELLP syndrome, PCOS (polycystic ovarian syndrome), and Sleep apnea. here with    No diagnosis found.  Brenda Molina is tolerating acetazolamide well and reports having very little headaches. We will continue mg twice daily. I have encouraged her to update eye exam. She is having intermittent blurred vision of right eye. No associated headaches or vision loss. She will have Dr Manuella Ghazi send [place] a report of findings. I have encouraged her to focus on healthy lifestyle habits. Sleep hygiene reviewed. She may consider melatonin 5mg  at bedtime. Weight management encouraged. Compliance report reveals sub optimal compliance. She does note improvement in energy levels when using CPAP but admits that she forgets to start therapy at night. She was advised to focus on using CPAP every night for at least 4 hours. I will have her follow up with me in 4 months. She verbalizes understanding and agreement with this plan.    No orders of the defined types were placed in this encounter.     No orders of the defined types were placed in this encounter.      Debbora Presto, MSN, FNP-C 11/04/2021, 4:32 PM  [facility] [address] [place], [place] [address] [phone]

## 2021-11-05 ENCOUNTER — Encounter: Payer: Self-pay | Admitting: Family Medicine

## 2021-11-05 ENCOUNTER — Ambulatory Visit: Payer: No Typology Code available for payment source | Admitting: Family Medicine

## 2021-11-05 DIAGNOSIS — G932 Benign intracranial hypertension: Secondary | ICD-10-CM

## 2021-11-05 DIAGNOSIS — G4733 Obstructive sleep apnea (adult) (pediatric): Secondary | ICD-10-CM

## 2021-12-10 NOTE — Progress Notes (Signed)
 Subjective:  Patient ID: Brenda Molina is a 36 y.o. female.  Chief Complaint  Patient presents with  . Ear Complaint    Ear pain since last Friday- pain at 5    HPI    Ear Complaint    Additional comments: Ear pain since last Friday- pain at 5      Last edited by Coleman Hahn, CNA on 12/10/2021  4:57 PM.     HPI  Ear Complaint Patient presents with: ear pressure   Laterality:  Right Quality:  Aching and shooting Onset quality:  Gradual Duration:  5 days Frequency:  Constant Progression:  Unchanged Chronicity:  New Relieved by: zyrtec . Response to treatment:  Mild improvement Associated symptoms: congestion, ear pain, headaches, neck pain and rhinorrhea   Associated symptoms: no bleeding from ear, no cough, no dizziness, no ear discharge, no fever, no foreign body, no hearing loss, no ear redness, no ear tubes, no rash, no sore throat, no ear swelling, no ringing in ear, no elevation change, no loud noise and no sneezing   Associated symptoms comment:  Sore neck on the right side        Review of Systems  Constitutional: Negative for activity change, appetite change, chills, diaphoresis, fatigue, fever and unexpected weight change.  HENT: Positive for congestion, ear pain, rhinorrhea and sinus pain. Negative for dental problem, drooling, ear discharge, facial swelling, hearing loss, mouth sores, nosebleeds, postnasal drip, sinus pressure, sneezing, sore throat, tinnitus, trouble swallowing and voice change.   Eyes: Negative for pain and itching.  Respiratory: Negative for apnea, cough, choking, chest tightness, shortness of breath, wheezing and stridor.   Cardiovascular: Negative for chest pain, palpitations and leg swelling.  Gastrointestinal: Negative for abdominal distention, abdominal pain, anal bleeding, blood in stool, constipation, diarrhea, nausea, rectal pain and vomiting.  Musculoskeletal: Positive for neck pain. Negative for arthralgias, back pain, gait  problem, joint swelling, myalgias and neck stiffness.  Skin: Negative for color change, pallor, rash and wound.  Allergic/Immunologic: Negative for environmental allergies, food allergies and immunocompromised state.  Neurological: Positive for headaches. Negative for dizziness, seizures, facial asymmetry, light-headedness and numbness.    Social History   Tobacco Use  Smoking Status Never  . Passive exposure: Never  Smokeless Tobacco Never   Past Medical History:  Diagnosis Date  . Allergic disorder   . Anxiety   . Depression    Past Surgical History:  Procedure Laterality Date  . BRAIN SURGERY     stent into right ventrical   History reviewed. No pertinent family history. Objective:      Physical Exam Vitals and nursing note reviewed.  Constitutional:      General: She is awake. She is not in acute distress.    Appearance: Normal appearance. She is well-developed and well-groomed. She is not ill-appearing, toxic-appearing or diaphoretic.  HENT:     Head: Normocephalic and atraumatic.     Right Ear: Hearing, ear canal and external ear normal. No decreased hearing noted. No laceration, drainage, swelling or tenderness. A middle ear effusion is present. There is no impacted cerumen. No foreign body. No mastoid tenderness. No PE tube. No hemotympanum. Tympanic membrane is erythematous and bulging. Tympanic membrane is not injected, scarred, perforated or retracted.     Left Ear: Hearing, tympanic membrane, ear canal and external ear normal. No decreased hearing noted. No laceration, drainage, swelling or tenderness.  No middle ear effusion. There is no impacted cerumen. No foreign body. No PE tube. Tympanic membrane is not  injected, scarred, perforated, erythematous, retracted or bulging.     Ears:     Comments: Small amount of serus fluid noted behind right TM, no bulging noted    Nose: Nose normal. No congestion or rhinorrhea.     Right Turbinates: Not enlarged, swollen or  pale.     Left Turbinates: Not enlarged, swollen or pale.     Right Sinus: No maxillary sinus tenderness or frontal sinus tenderness.     Left Sinus: No maxillary sinus tenderness or frontal sinus tenderness.     Mouth/Throat:     Lips: Pink. No lesions.     Mouth: Mucous membranes are moist.     Tongue: No lesions. Tongue does not deviate from midline.     Palate: No mass and lesions.     Pharynx: Oropharynx is clear. Uvula midline. No pharyngeal swelling, oropharyngeal exudate, posterior oropharyngeal erythema or uvula swelling.  Cardiovascular:     Rate and Rhythm: Normal rate and regular rhythm.     Pulses: Normal pulses.     Heart sounds: Normal heart sounds, S1 normal and S2 normal. No murmur heard.   No friction rub. No gallop.  Pulmonary:     Effort: Pulmonary effort is normal. No respiratory distress.     Breath sounds: Normal breath sounds and air entry. No stridor. No wheezing, rhonchi or rales.  Chest:     Chest wall: No tenderness.  Musculoskeletal:     Cervical back: Normal range of motion and neck supple.  Lymphadenopathy:     Head:     Right side of head: No submental, submandibular, tonsillar, preauricular, posterior auricular or occipital adenopathy.     Left side of head: No submental, submandibular, tonsillar, preauricular, posterior auricular or occipital adenopathy.     Cervical: No cervical adenopathy.     Right cervical: No superficial, deep or posterior cervical adenopathy.    Left cervical: No superficial, deep or posterior cervical adenopathy.     Upper Body:     Right upper body: No supraclavicular adenopathy.     Left upper body: No supraclavicular adenopathy.  Skin:    General: Skin is warm and dry.     Coloration: Skin is not ashen, cyanotic, jaundiced, mottled, pale or sallow.     Findings: No rash.  Neurological:     Mental Status: She is alert and oriented to person, place, and time.  Psychiatric:        Mood and Affect: Mood normal.         Behavior: Behavior normal. Behavior is cooperative.        Thought Content: Thought content normal.        Judgment: Judgment normal.       Assessment/Plan:  HPI provided by Self  Based on today's visit:history and physical exam only, as no relevant testing deemed necessary patient's visit diagnosis is/includes  1. Non-recurrent acute serous otitis media of right ear    Patient has a history of chronic conditions and those listed in the visit diagnoses were reviewed today. They are currently managed by pcp on medications.  Treatment plan includes:  Orders Placed: No orders of the defined types were placed in this encounter.  Medications ordered this visit   Signed Prescriptions Disp Refills  . amoxicillin -clavulanate (AUGMENTIN ) 875-125 mg tablet 20 tablet 0    Sig: Take 1 tablet by mouth every 12 (twelve) hours for 10 days    Current medication list and any new medications prescribed or recommended today were  reviewed with the patient and specific instructions were provided Yes     Provider Recommendations   1. Non-recurrent acute serous otitis media of right ear  - Continue daily allergy  medicine  - amoxicillin -clavulanate (AUGMENTIN ) 875-125 mg tablet; Take 1 tablet by mouth every 12 (twelve) hours for 10 days  Dispense: 20 tablet; Refill: 0  Follow up care instructions were provided and reviewed?with the  Patient. All questions were answered. Patient verbalized understanding of plan of care today.

## 2023-10-22 ENCOUNTER — Encounter (HOSPITAL_COMMUNITY): Payer: Self-pay | Admitting: Interventional Radiology

## 2023-10-27 ENCOUNTER — Other Ambulatory Visit: Payer: Self-pay

## 2023-10-27 ENCOUNTER — Encounter (HOSPITAL_COMMUNITY): Payer: Self-pay

## 2023-10-27 ENCOUNTER — Emergency Department (HOSPITAL_COMMUNITY)
Admission: EM | Admit: 2023-10-27 | Discharge: 2023-10-27 | Disposition: A | Discharge status: Home / Self Care | Attending: Emergency Medicine | Admitting: Emergency Medicine

## 2023-10-27 ENCOUNTER — Emergency Department (HOSPITAL_COMMUNITY)

## 2023-10-27 DIAGNOSIS — R002 Palpitations: Secondary | ICD-10-CM | POA: Insufficient documentation

## 2023-10-27 DIAGNOSIS — R42 Dizziness and giddiness: Secondary | ICD-10-CM | POA: Diagnosis not present

## 2023-10-27 DIAGNOSIS — R079 Chest pain, unspecified: Secondary | ICD-10-CM | POA: Insufficient documentation

## 2023-10-27 DIAGNOSIS — R739 Hyperglycemia, unspecified: Secondary | ICD-10-CM

## 2023-10-27 DIAGNOSIS — R7309 Other abnormal glucose: Secondary | ICD-10-CM | POA: Insufficient documentation

## 2023-10-27 DIAGNOSIS — M898X1 Other specified disorders of bone, shoulder: Secondary | ICD-10-CM

## 2023-10-27 DIAGNOSIS — E876 Hypokalemia: Secondary | ICD-10-CM | POA: Insufficient documentation

## 2023-10-27 DIAGNOSIS — M25512 Pain in left shoulder: Secondary | ICD-10-CM | POA: Insufficient documentation

## 2023-10-27 DIAGNOSIS — R200 Anesthesia of skin: Secondary | ICD-10-CM | POA: Insufficient documentation

## 2023-10-27 DIAGNOSIS — Z7982 Long term (current) use of aspirin: Secondary | ICD-10-CM | POA: Insufficient documentation

## 2023-10-27 LAB — COMPREHENSIVE METABOLIC PANEL WITH GFR
ALT: 30 U/L (ref 0–44)
AST: 26 U/L (ref 15–41)
Albumin: 3.7 g/dL (ref 3.5–5.0)
Alkaline Phosphatase: 50 U/L (ref 38–126)
Anion gap: 8 (ref 5–15)
BUN: 12 mg/dL (ref 6–20)
CO2: 23 mmol/L (ref 22–32)
Calcium: 8.7 mg/dL — ABNORMAL LOW (ref 8.9–10.3)
Chloride: 108 mmol/L (ref 98–111)
Creatinine, Ser: 0.89 mg/dL (ref 0.44–1.00)
GFR, Estimated: >60 mL/min (ref >60)
Glucose, Bld: 106 mg/dL — ABNORMAL HIGH (ref 70–99)
Potassium: 3.2 mmol/L — ABNORMAL LOW (ref 3.5–5.1)
Sodium: 139 mmol/L (ref 135–145)
Total Bilirubin: 1.1 mg/dL (ref 0.0–1.2)
Total Protein: 6.3 g/dL — ABNORMAL LOW (ref 6.5–8.1)

## 2023-10-27 LAB — DIFFERENTIAL
Abs Immature Granulocytes: 0.02 10*3/uL (ref 0.00–0.07)
Basophils Absolute: 0 10*3/uL (ref 0.0–0.1)
Basophils Relative: 1 %
Eosinophils Absolute: 0.3 10*3/uL (ref 0.0–0.5)
Eosinophils Relative: 4 %
Immature Granulocytes: 0 %
Lymphocytes Relative: 38 %
Lymphs Abs: 2.8 10*3/uL (ref 0.7–4.0)
Monocytes Absolute: 0.5 10*3/uL (ref 0.1–1.0)
Monocytes Relative: 6 %
Neutro Abs: 3.8 10*3/uL (ref 1.7–7.7)
Neutrophils Relative %: 51 %

## 2023-10-27 LAB — CBC
HCT: 37.6 % (ref 36.0–46.0)
Hemoglobin: 12.6 g/dL (ref 12.0–15.0)
MCH: 25 pg — ABNORMAL LOW (ref 26.0–34.0)
MCHC: 33.5 g/dL (ref 30.0–36.0)
MCV: 74.8 fL — ABNORMAL LOW (ref 80.0–100.0)
Platelets: 243 10*3/uL (ref 150–400)
RBC: 5.03 MIL/uL (ref 3.87–5.11)
RDW: 14.2 % (ref 11.5–15.5)
WBC: 7.4 10*3/uL (ref 4.0–10.5)
nRBC: 0 % (ref 0.0–0.2)

## 2023-10-27 LAB — PROTIME-INR
INR: 1 (ref 0.8–1.2)
Prothrombin Time: 13.6 s (ref 11.4–15.2)

## 2023-10-27 LAB — TROPONIN I (HIGH SENSITIVITY)
Troponin I (High Sensitivity): <2 ng/L (ref <18)
Troponin I (High Sensitivity): <2 ng/L (ref <18)

## 2023-10-27 LAB — ETHANOL: Alcohol, Ethyl (B): <15 mg/dL (ref <15)

## 2023-10-27 LAB — HCG, SERUM, QUALITATIVE: Preg, Serum: NEGATIVE

## 2023-10-27 LAB — APTT: aPTT: 28 s (ref 24–36)

## 2023-10-27 LAB — MAGNESIUM: Magnesium: 1.9 mg/dL (ref 1.7–2.4)

## 2023-10-27 MED ORDER — CYCLOBENZAPRINE HCL 10 MG PO TABS: 10.0000 mg | ORAL_TABLET | Freq: Three times a day (TID) | ORAL | 0 refills | Status: AC | PRN

## 2023-10-27 MED ORDER — ACETAMINOPHEN 325 MG PO TABS
650.0000 mg | ORAL_TABLET | Freq: Once | ORAL | Status: AC
  Administered 2023-10-27: 650 mg via ORAL
  Filled 2023-10-27: qty 2

## 2023-10-27 MED ORDER — CYCLOBENZAPRINE HCL 10 MG PO TABS
10.0000 mg | ORAL_TABLET | Freq: Once | ORAL | Status: AC
  Administered 2023-10-27: 10 mg via ORAL
  Filled 2023-10-27: qty 1

## 2023-10-27 MED ORDER — IBUPROFEN 400 MG PO TABS
400.0000 mg | ORAL_TABLET | Freq: Once | ORAL | Status: AC
  Administered 2023-10-27: 400 mg via ORAL
  Filled 2023-10-27: qty 1

## 2023-10-27 MED ORDER — NAPROXEN 500 MG PO TABS: 500.0000 mg | ORAL_TABLET | Freq: Two times a day (BID) | ORAL | 0 refills | Status: AC

## 2023-10-27 MED ORDER — POTASSIUM CHLORIDE CRYS ER 20 MEQ PO TBCR
20.0000 meq | EXTENDED_RELEASE_TABLET | Freq: Two times a day (BID) | ORAL | 0 refills | Status: AC
Start: 2023-10-27 — End: ?

## 2023-10-27 MED ORDER — POTASSIUM CHLORIDE CRYS ER 20 MEQ PO TBCR
40.0000 meq | EXTENDED_RELEASE_TABLET | Freq: Once | ORAL | Status: AC
  Administered 2023-10-27: 40 meq via ORAL
  Filled 2023-10-27: qty 2

## 2023-10-27 NOTE — ED Triage Notes (Addendum)
 Pt arrived from home via POV c/o left shoulder pain described as burning 7/10  x 1 week that turned into pins and needles and dizziness x 1 week. Pt states that the dizziness is getting increasingly worse. NIH 0

## 2023-10-27 NOTE — ED Provider Notes (Signed)
  EMERGENCY DEPARTMENT AT [facility] Provider Note   CSN: [id number] Arrival date & time: 10/27/23  0139     Patient presents with: Dizziness and Shoulder Pain   Brenda Molina is a 38 y.o. female.   The history is provided by the patient.  Dizziness Shoulder Pain She has history of polycystic ovarian syndrome, GERD and comes in with a 3-day history of pain in the left scapular area which has been constant, intermittent left-sided chest pain, intermittent left arm and left leg numbness.  She has also noticed some palpitations where she feels that her heart is beating stronger than usual.  Symptoms are not affected by movement or exertion.  Chest pain, arm and leg numbness tend to come together and resolved together.  She has taken acetaminophen  without relief.  She has not noticed any weakness.  She denies bowel or bladder dysfunction.  She denies any recent trauma or overuse but is concerned that stress and anxiety may be causing her symptoms.  She is a non-smoker and denies history of hypertension, diabetes, hyperlipidemia.  There is no family history of premature coronary atherosclerosis.   Prior to Admission medications   Medication Sig Start Date End Date Taking? Authorizing Provider  cyclobenzaprine (FLEXERIL) 10 MG tablet Take 1 tablet (10 mg total) by mouth 3 (three) times daily as needed for muscle spasms. 10/27/23  Yes Raford Lenis, MD  naproxen (NAPROSYN) 500 MG tablet Take 1 tablet (500 mg total) by mouth 2 (two) times daily. 10/27/23  Yes Raford Lenis, MD  potassium chloride SA (KLOR-CON M) 20 MEQ tablet Take 1 tablet (20 mEq total) by mouth 2 (two) times daily. 10/27/23  Yes Raford Lenis, MD  acetaZOLAMIDE  (DIAMOX ) 500 MG capsule Take 2 pills twice per day 10/07/20   Athar, Saima, MD  ascorbic acid (VITAMIN C) 500 MG tablet Take 500 mg by mouth daily.    [provider]  aspirin  325 MG tablet Take 325 mg by mouth daily.    [provider]  b  complex vitamins capsule Take 1 capsule by mouth daily.    [provider]  cetirizine  (ZYRTEC ) 10 MG tablet Take 10 mg by mouth daily as needed for allergies.    [provider]  fluticasone  (FLONASE ) 50 MCG/ACT nasal spray Place 2 sprays into both nostrils daily. 05/22/20   Levora Reyes SAUNDERS, MD  levonorgestrel  (MIRENA , 52 MG,) 20 MCG/24HR IUD 1 each by Intrauterine route once.     [provider]  Polyethylene Glycol 400 (BLINK TEARS OP) Place 1 drop into both eyes daily as needed (dry eyes).    [provider]    Allergies: Patient has no known allergies.    Review of Systems  Neurological:  Positive for dizziness.  All other systems reviewed and are negative.   Updated Vital Signs BP 113/72 (BP Location: Left Arm)   Pulse 65   Temp 98 F (36.7 C) (Oral)   Resp 16   Ht 5' 3 (1.6 m)   Wt 86.2 kg   LMP 07/27/2023 (Approximate)   SpO2 100%   BMI 33.66 kg/m   Physical Exam Vitals and nursing note reviewed.   38 year old female, resting comfortably and in no acute distress. Vital signs are normal. Oxygen saturation is 100%, which is normal. Head is normocephalic and atraumatic. PERRLA, EOMI. Oropharynx is clear. Neck is nontender and supple. Back has tenderness well localized to the left scapular area. Lungs are clear without rales, wheezes,  or rhonchi. Chest is nontender. Heart has regular rate and rhythm without murmur. Abdomen is soft, flat, nontender without masses or hepatosplenomegaly and peristalsis is normoactive. Extremities have no cyanosis or edema, full range of motion is present.  There are no rotator cuff impingement signs present. Skin is warm and dry without rash. Neurologic: Mental status is normal, cranial nerves are intact.  Strength is 5/5 in all 4 extremities, there is no pronator drift.  Muscles tested include shoulder abduction, shoulder abduction, elbow flexion, elbow extension, wrist grip, hip flexion, ankle  dorsiflexion, ankle plantarflexion.  Sensation is intact throughout and there is no extinction on double simultaneous stimulation..  (all labs ordered are listed, but only abnormal results are displayed) Labs Reviewed  CBC - Abnormal; Notable for the following components:      Result Value   MCV 74.8 (*)    MCH 25.0 (*)    All other components within normal limits  COMPREHENSIVE METABOLIC PANEL WITH GFR - Abnormal; Notable for the following components:   Potassium 3.2 (*)    Glucose, Bld 106 (*)    Calcium  8.7 (*)    Total Protein 6.3 (*)    All other components within normal limits  PROTIME-INR  APTT  DIFFERENTIAL  ETHANOL  HCG, SERUM, QUALITATIVE  MAGNESIUM   TROPONIN I (HIGH SENSITIVITY)  TROPONIN I (HIGH SENSITIVITY)    EKG: EKG Interpretation Date/Time:  Wednesday October 27 2023 05:59:12 EDT Ventricular Rate:  59 PR Interval:  163 QRS Duration:  85 QT Interval:  403 QTC Calculation: 400 R Axis:   71  Text Interpretation: Sinus rhythm Borderline T abnormalities, anterior leads No old tracing to compare Confirmed by Raford Lenis ([id number]) on 10/27/2023 6:02:46 AM  Radiology: CT HEAD WO CONTRAST Result Date: 10/27/2023 CLINICAL DATA:  Worsening dizziness with left shoulder pain. EXAM: CT HEAD WITHOUT CONTRAST TECHNIQUE: Contiguous axial images were obtained from the base of the skull through the vertex without intravenous contrast. RADIATION DOSE REDUCTION: This exam was performed according to the departmental dose-optimization program which includes automated exposure control, adjustment of the mA and/or kV according to patient size and/or use of iterative reconstruction technique. COMPARISON:  None Available. FINDINGS: Brain: No evidence of acute infarction, hemorrhage, hydrocephalus, extra-axial collection or mass lesion/mass effect. Vascular: No hyperdense vessel or unexpected calcification. Skull: Normal. Negative for fracture or focal lesion. Sinuses/Orbits: No acute finding.  Other: None. IMPRESSION: No acute intracranial pathology. Electronically Signed   By: Suzen Dials M.D.   On: 10/27/2023 02:30     Procedures   Medications Ordered in the ED  potassium chloride SA (KLOR-CON M) CR tablet 40 mEq (40 mEq Oral Given 10/27/23 0339)  ibuprofen  (ADVIL ) tablet 400 mg (400 mg Oral Given 10/27/23 0339)  acetaminophen  (TYLENOL ) tablet 650 mg (650 mg Oral Given 10/27/23 0339)  cyclobenzaprine (FLEXERIL) tablet 10 mg (10 mg Oral Given 10/27/23 0339)                HEART Score: 0                    Medical Decision Making Amount and/or Complexity of Data Reviewed Labs: ordered. Radiology: ordered.  Risk OTC drugs. Prescription drug management.   Left scapular pain which is most likely musculoskeletal.  No red flags to suggest more serious pathology.  Chest pain, left arm and leg numbness seem most likely to be secondary to the scapular pain.  Pain is quite atypical for ACS, no risk factors for pulmonary embolism.  Palpitations are subjective.  I have ordered an electrocardiogram.  I have reviewed her laboratory tests, and my interpretation is mild hypokalemia and mildly elevated random glucose level, normal CBC, normal initial troponin, not pregnant.  Hypokalemia could be contributing to some arrhythmias.  I have ordered a dose of oral potassium.  At this point, no objective neurologic deficits, no evidence of stroke.  I have ordered a magnesium  level and oral ibuprofen , acetaminophen , methocarbamol.  I have reviewed her electrocardiogram, my interpretation is normal ECG.  Magnesium  level is normal.  Repeat troponin is undetectable.  Heart score is 0, which puts her at low risk for major adverse cardiac events in the next 6 weeks.  She had excellent relief of symptoms with above-noted treatment.  I am discharging her with prescriptions for oral potassium, naproxen, cyclobenzaprine and recommending she follow-up with her primary care provider.     Final diagnoses:   Pain of left scapula  Nonspecific chest pain  Hypokalemia  Elevated random blood glucose level    ED Discharge Orders          Ordered    naproxen (NAPROSYN) 500 MG tablet  2 times daily        10/27/23 0611    cyclobenzaprine (FLEXERIL) 10 MG tablet  3 times daily PRN        10/27/23 0611    potassium chloride SA (KLOR-CON M) 20 MEQ tablet  2 times daily        10/27/23 9388               Raford Lenis, MD 10/27/23 [phone]

## 2023-10-27 NOTE — Discharge Instructions (Addendum)
 Your evaluation did not show any sign of anything serious.  I recommend that you apply ice to the sore area in your back.  Ice should be applied for 30 minutes at a time, 4 times a day.  Please take the naproxen and cyclobenzaprine that I have ordered for you.  If you need additional pain relief, add acetaminophen .

## 2023-11-17 ENCOUNTER — Telehealth: Payer: Self-pay | Admitting: Family Medicine

## 2023-11-17 NOTE — Telephone Encounter (Signed)
 Please call patient and schedule her for an annual exam with Dr Levora

## 2023-11-17 NOTE — Telephone Encounter (Signed)
 It appears her last visit was 08/2020 so she would need to re-establish?

## 2023-11-17 NOTE — Telephone Encounter (Signed)
 Please advise  Copied from CRM #[id number]. Topic: Appointments - Scheduling Inquiry for Clinic >> Nov 16, 2023  3:47 PM Brenda Molina wrote:   Reason for CRM: Patient would like to schedule an annual physical with Dr. Levora. No schedule generates for scheduling. Please contact patient to schedule and day or time is fine.  CB#[phone]

## 2023-11-17 NOTE — Telephone Encounter (Signed)
 Per Jacquline, she is just shy of 3 years. Okay to schedule for CPE

## 2023-11-18 ENCOUNTER — Telehealth (INDEPENDENT_AMBULATORY_CARE_PROVIDER_SITE_OTHER): Admitting: Family Medicine

## 2023-11-18 ENCOUNTER — Encounter: Payer: Self-pay | Admitting: Family Medicine

## 2023-11-18 DIAGNOSIS — F418 Other specified anxiety disorders: Secondary | ICD-10-CM

## 2023-11-18 DIAGNOSIS — E876 Hypokalemia: Secondary | ICD-10-CM | POA: Diagnosis not present

## 2023-11-18 MED ORDER — SERTRALINE HCL 50 MG PO TABS: 50.0000 mg | ORAL_TABLET | Freq: Every day | ORAL | 3 refills | Status: DC

## 2023-11-18 MED ORDER — BUSPIRONE HCL 7.5 MG PO TABS: 7.5000 mg | ORAL_TABLET | Freq: Two times a day (BID) | ORAL | 2 refills | Status: DC

## 2023-11-18 NOTE — Telephone Encounter (Signed)
 Pt scheduled 9/22 for CPE

## 2023-11-18 NOTE — Progress Notes (Signed)
 Virtual Visit via Video Note  I connected with Brenda Molina on 11/18/23 at 1:41 PM by a video enabled telemedicine application and verified that I am speaking with the correct person using two identifiers.  Patient location: home by self. My location: office [place].    I discussed the limitations, risks, security and privacy concerns of performing an evaluation and management service by telephone and the availability of in person appointments. I also discussed with the patient that there may be a patient responsible charge related to this service. The patient expressed understanding and agreed to proceed, consent obtained  Chief complaint:  Chief Complaint  Patient presents with   Anxiety    Pt notes some situational anxiety as well as general increase and changes in her mood. When she gets overwhelmed she has been feeling like her heart is racing, trouble sleeping. Mostly over the last two weeks, went to ED for cardiac sxs to ensure she was clear, PHQ-9 and GAD-7 completed     History of Present Illness: Brenda Molina is a 38 y.o. female  Anxiety Last visit with me in May 2022.  History of depression and anxiety at that time with PHQ of 12 and GAD-7 of 14.  Had been treated with Zoloft  100 mg daily, BuSpar  7.5 mg twice daily with prior counseling and hydroxyzine  as needed for sleep.  She had noted possible sedation with higher dosing of Zoloft  beyond 50 mg, so we decreased Zoloft  to 50 mg at that time, and continue on BuSpar  with counseling planned. She was seen in the ER July 2, normal magnesium  level at that time, heart score was 0.  Suspected left scapular pain was musculoskeletal.  Subjective palpitations, no concerns on ECG.  Mild hypokalemia (K3.2).  mildly elevated random glucose, normal CBC.  Oral potassium was ordered.  Today reports she has had some anxiety for awhile, but worse recently. Has not followed up with medical provider in past few years. Stopped meds  about 2 years ago. Did not want stay on meds if not needed.  Appt with therapist 2 days ago. Had been without therapy for awhile as well, then restarted earlier this year.  Denies SI/HI, A/V/T hallucinations.  Some of the same anxiety, increased stressors. Some financail stressors, may need to be looking for new housing for her and her dtr.   Tx: Ashw[place]anda - no relief.        11/18/2023    1:11 PM 09/04/2020    2:41 PM 06/21/2020    3:28 PM 02/22/2019    4:44 PM  GAD 7 : Generalized Anxiety Score  Nervous, Anxious, on Edge 3 2 1 2   Control/stop worrying 3 2 1 2   Worry too much - different things 3 2 1 2   Trouble relaxing 3 2 2 2   Restless 0 2 0 1  Easily annoyed or irritable 3 3 1 1   Afraid - awful might happen 3 1 1 2   Total GAD 7 Score 18 14 7 12   Anxiety Difficulty Extremely difficult          11/18/2023    1:06 PM 09/04/2020    2:39 PM 06/21/2020    3:27 PM 05/22/2020    3:11 PM 03/02/2019    5:04 PM  Depression screen PHQ 2/9  Decreased Interest 3 1 1  0 0  Down, Depressed, Hopeless 3 1 1  0 0  PHQ - 2 Score 6 2 2  0 0  Altered sleeping 3 2 1  Tired, decreased energy 3 3 2     Change in appetite 3 3 1     Feeling bad or failure about yourself  3 1 0    Trouble concentrating 2 1 1     Moving slowly or fidgety/restless 0 0 0    Suicidal thoughts 0 0 0    PHQ-9 Score 20 12 7     Difficult doing work/chores Not difficult at all         Patient Active Problem List   Diagnosis Date Noted   Pulsatile tinnitus of right ear 05/08/2020   Perennial and seasonal allergic rhinitis 04/18/2018   Cough, persistent 04/18/2018   History of wheezing 04/18/2018   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   Normal vaginal delivery 06/06/2015   RhD negative 06/05/2015   Preterm premature rupture of membranes 06/05/2015   Past Medical History:  Diagnosis Date   Allergy     Anxiety    Asthma    seasonal- allergy    GERD (gastroesophageal reflux disease)    some times   HELLP syndrome     post parteum   PCOS (polycystic ovarian syndrome)    Sleep apnea    Past Surgical History:  Procedure Laterality Date   IR ANGIO INTRA EXTRACRAN SEL COM CAROTID INNOMINATE BILAT MOD SED  04/02/2020   IR ANGIO INTRA EXTRACRAN SEL COM CAROTID INNOMINATE UNI R MOD SED  05/08/2020   IR ANGIO VERTEBRAL SEL VERTEBRAL BILAT MOD SED  04/02/2020   IR CT HEAD LTD  05/08/2020   IR RADIOLOGIST EVAL & MGMT  02/28/2020   IR RADIOLOGIST EVAL & MGMT  05/29/2020   IR TRANSCATH PLC STENT  INITIAL VEIN  INC ANGIOPLASTY  05/08/2020   IR VENO/JUGULAR RIGHT  05/08/2020   RADIOLOGY WITH ANESTHESIA N/A 05/01/2020   Procedure: STENTING;  Surgeon: Dolphus Carrion, MD;  Location: [facility] OR;  Service: Radiology;  Laterality: N/A;   RADIOLOGY WITH ANESTHESIA N/A 05/08/2020   Procedure: STENT PLACEMENT;  Surgeon: Dolphus Carrion, MD;  Location: [facility] OR;  Service: Radiology;  Laterality: N/A;   WISDOM TOOTH EXTRACTION     No Known Allergies Prior to Admission medications   Medication Sig Start Date End Date Taking? Authorizing Provider  acetaZOLAMIDE  (DIAMOX ) 500 MG capsule Take 2 pills twice per day 10/07/20  Yes Athar, Saima, MD  ascorbic acid (VITAMIN C) 500 MG tablet Take 500 mg by mouth daily.   Yes [provider]  aspirin  325 MG tablet Take 325 mg by mouth daily.   Yes [provider]  b complex vitamins capsule Take 1 capsule by mouth daily.   Yes [provider]  cetirizine  (ZYRTEC ) 10 MG tablet Take 10 mg by mouth daily as needed for allergies.   Yes [provider]  cyclobenzaprine  (FLEXERIL ) 10 MG tablet Take 1 tablet (10 mg total) by mouth 3 (three) times daily as needed for muscle spasms. 10/27/23  Yes Raford Lenis, MD  fluticasone  (FLONASE ) 50 MCG/ACT nasal spray Place 2 sprays into both nostrils daily. 05/22/20  Yes Levora Reyes SAUNDERS, MD  levonorgestrel  (MIRENA , 52 MG,) 20 MCG/24HR IUD 1 each by Intrauterine route once.    Yes [provider]  naproxen  (NAPROSYN ) 500 MG  tablet Take 1 tablet (500 mg total) by mouth 2 (two) times daily. 10/27/23  Yes Raford Lenis, MD  Polyethylene Glycol 400 (BLINK TEARS OP) Place 1 drop into both eyes daily as needed (dry eyes).   Yes [provider]  potassium chloride  SA (KLOR-CON  M) 20 MEQ tablet Take  1 tablet (20 mEq total) by mouth 2 (two) times daily. 10/27/23  Yes Raford Lenis, MD   Social History   Socioeconomic History   Marital status: Single    Spouse name: Not on file   Number of children: 1   Years of education: Not on file   Highest education level: Not on file  Occupational History   Not on file  Tobacco Use   Smoking status: Never   Smokeless tobacco: Never  Vaping Use   Vaping status: Never Used  Substance and Sexual Activity   Alcohol use: Not Currently    Comment: occ   Drug use: No   Sexual activity: Yes    Partners: Male    Birth control/protection: Condom  Other Topics Concern   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 Has no children. 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   Social Drivers of Health   Financial Resource Strain: Not on file  Food Insecurity: Not on file  Transportation Needs: Not on file  Physical Activity: Not on file  Stress: Not on file  Social Connections: Unknown (09/09/2021)   [facility]   Social Network    Social Network: Not on file  Intimate Partner Violence: Unknown (08/01/2021)   Received from [facility]   HITS    Physically Hurt: Not on file    Insult or Talk Down To: Not on file    Threaten Physical Harm: Not on file    Scream or Curse: Not on file    Observations/Objective: There were no vitals filed for this visit. Appropriate responses, does not appear to be responding to internal stimuli.  No respiratory distress.  Nontoxic appearance.  No SI/HI.  Assessment and Plan: Depression with anxiety - Plan: sertraline  (ZOLOFT ) 50 MG  tablet, busPIRone  (BUSPAR ) 7.5 MG tablet  - Worsening control depression and anxiety off her usual medications for the past few years.  Increased stressors in life right now as well complicating matters.  She has restarted with therapy which I think will be helpful but we decided to restart her previous medications as well, with BuSpar  7.5 mg twice daily, okay to start daily dose initially, and sertraline  50 mg daily.  Recheck in the next 1 month, sooner if needed.  Handout given on managing anxiety and depression.  Hypokalemia - Plan: Basic metabolic panel with GFR  - Noted in the ER, repeat labs ordered with adjustment of plan accordingly.  Follow Up Instructions:    I discussed the assessment and treatment plan with the patient. The patient was provided an opportunity to ask questions and all were answered. The patient agreed with the plan and demonstrated an understanding of the instructions.   The patient was advised to call back or seek an in-person evaluation if the symptoms worsen or if the condition fails to improve as anticipated.   Reyes JONELLE Pines, MD

## 2023-11-18 NOTE — Patient Instructions (Signed)
 Good talking with you again today.  I am sorry to hear about the extra stressors in life right now and the worsening of the depression and anxiety symptoms.  I do think it would be reasonable to restart the medications you took previously, with sertraline  50 mg once per day, and the BuSpar  up to twice per day.  Okay to start that once per day initially if you would like.  See information below on managing anxiety and depression but keep follow-up with your therapist as well to help guide you through these stressors.  I will follow-up with you in the next month and we can see how things are going.  Potassium was slightly low in the ER, please have repeat lab work at the [facility] location below.  I will let you know if any concerns.  Hang in there!  [facility] or xray: Walk in 8:30-4:30 during weekdays, no appointment needed [address]  [place], [place] [address]  Managing Depression, Adult Depression is a mental health condition that affects your thoughts, feelings, and actions. Being diagnosed with depression can bring you relief if you did not know why you have felt or behaved a certain way. It could also leave you feeling overwhelmed. Finding ways to manage your symptoms can help you feel more positive about your future. How to manage lifestyle changes Being depressed is difficult. Depression can increase the level of everyday stress. Stress can make depression symptoms worse. You may believe your symptoms cannot be managed or will never improve. However, there are many things you can try to help manage your symptoms. There is hope. Managing stress  Stress is your body's reaction to life changes and events, both good and bad. Stress can add to your feelings of depression. Learning to manage your stress can help lessen your feelings of depression. Try some of the following approaches to reducing your stress (stress reduction techniques): Listen to music that you enjoy and that inspires  you. Try using a meditation app or take a meditation class. Develop a practice that helps you connect with your spiritual self. Walk in nature, pray, or go to a place of worship. Practice deep breathing. To do this, inhale slowly through your nose. Pause at the top of your inhale for a few seconds and then exhale slowly, letting yourself relax. Repeat this three or four times. Practice yoga to help relax and work your muscles. Choose a stress reduction technique that works for you. These techniques take time and practice to develop. Set aside 5-15 minutes a day to do them. Therapists can offer training in these techniques. Do these things to help manage stress: Keep a journal. Know your limits. Set healthy boundaries for yourself and others, such as saying no when you think something is too much. Pay attention to how you react to certain situations. You may not be able to control everything, but you can change your reaction. Add humor to your life by watching funny movies or shows. Make time for activities that you enjoy and that relax you. Spend less time using electronics, especially at night before bed. The light from screens can make your brain think it is time to get up rather than go to bed.  Medicines Medicines, such as antidepressants, are often a part of treatment for depression. Talk with your pharmacist or health care provider about all the medicines, supplements, and herbal products that you take, their possible side effects, and what medicines and other products are  safe to take together. Make sure to report any side effects you may have to your health care provider. Relationships Your health care provider may suggest family therapy, couples therapy, or individual therapy as part of your treatment. How to recognize changes Everyone responds differently to treatment for depression. As you recover from depression, you may start to: Have more interest in doing activities. Feel more  hopeful. Have more energy. Eat a more regular amount of food. Have better mental focus. It is important to recognize if your depression is not getting better or is getting worse. The symptoms you had in the beginning may return, such as: Feeling tired. Eating too much or too little. Sleeping too much or too little. Feeling restless, agitated, or hopeless. Trouble focusing or making decisions. Having unexplained aches and pains. Feeling irritable, angry, or aggressive. If you or your family members notice these symptoms coming back, let your health care provider know right away. Follow these instructions at home: Activity Try to get some form of exercise each day, such as walking. Try yoga, mindfulness, or other stress reduction techniques. Participate in group activities if you are able. Lifestyle Get enough sleep. Cut down on or stop using caffeine, tobacco, alcohol, and any other harmful substances. Eat a healthy diet that includes plenty of vegetables, fruits, whole grains, low-fat dairy products, and lean protein. Limit foods that are high in solid fats, added sugar, or salt (sodium). General instructions Take over-the-counter and prescription medicines only as told by your health care provider. Keep all follow-up visits. It is important for your health care provider to check on your mood, behavior, and medicines. Your health care provider may need to make changes to your treatment. Where to find support Talking to others  Friends and family members can be sources of support and guidance. Talk to trusted friends or family members about your condition. Explain your symptoms and let them know that you are working with a health care provider to treat your depression. Tell friends and family how they can help. Finances Find mental health providers that fit with your financial situation. Talk with your health care provider if you are worried about access to food, housing, or  medicine. Call your insurance company to learn about your co-pays and prescription plan. Where to find more information You can find support in your area from: Anxiety and Depression Association of America (ADAA): adaa.org Mental Health America: mentalhealthamerica.net The First American on Mental Illness: nami.org Contact a health care provider if: You stop taking your antidepressant medicines, and you have any of these symptoms: Nausea. Headache. Light-headedness. Chills and body aches. Not being able to sleep (insomnia). You or your friends and family think your depression is getting worse. Get help right away if: You have thoughts of hurting yourself or others. Get help right away if you feel like you may hurt yourself or others, or have thoughts about taking your own life. Go to your nearest emergency room or: Call 911. Call the National Suicide Prevention Lifeline at 678-253-5297 or 988. This is open 24 hours a day. Text the Crisis Text Line at (916) 117-9800. This information is not intended to replace advice given to you by your health care provider. Make sure you discuss any questions you have with your health care provider. Document Revised: 08/19/2021 Document Reviewed: 08/19/2021 Elsevier Patient Education  2024 [employer]  Managing Anxiety, Adult After being diagnosed with anxiety, you may be relieved to know why you have felt or behaved a certain way. You  may also feel overwhelmed about the treatment ahead and what it will mean for your life. With care and support, you can manage your anxiety. How to manage lifestyle changes Understanding the difference between stress and anxiety Although stress can play a role in anxiety, it is not the same as anxiety. Stress is your body's reaction to life changes and events, both good and bad. Stress is often caused by something external, such as a deadline, test, or competition. It normally goes away after the event has ended and will last  just a few hours. But, stress can be ongoing and can lead to more than just stress. Anxiety is caused by something internal, such as imagining a terrible outcome or worrying that something will go wrong that will greatly upset you. Anxiety often does not go away even after the event is over, and it can become a long-term (chronic) worry. Lowering stress and anxiety Talk with your health care provider or a counselor to learn more about lowering anxiety and stress. They may suggest tension-reduction techniques, such as: Music. Spend time creating or listening to music that you enjoy and that inspires you. Mindfulness-based meditation. Practice being aware of your normal breaths while not trying to control your breathing. It can be done while sitting or walking. Centering prayer. Focus on a word, phrase, or sacred image that means something to you and brings you peace. Deep breathing. Expand your stomach and inhale slowly through your nose. Hold your breath for 3-5 seconds. Then breathe out slowly, letting your stomach muscles relax. Self-talk. Learn to notice and spot thought patterns that lead to anxiety reactions. Change those patterns to thoughts that feel peaceful. Muscle relaxation. Take time to tense muscles and then relax them. Choose a tension-reduction technique that fits your lifestyle and personality. These techniques take time and practice. Set aside 5-15 minutes a day to do them. Specialized therapists can offer counseling and training in these techniques. The training to help with anxiety may be covered by some insurance plans. Other things you can do to manage stress and anxiety include: Keeping a stress diary. This can help you learn what triggers your reaction and then learn ways to manage your response. Thinking about how you react to certain situations. You may not be able to control everything, but you can control your response. Making time for activities that help you relax and not  feeling guilty about spending your time in this way. Doing visual imagery. This involves imagining or creating mental pictures to help you relax. Practicing yoga. Through yoga poses, you can lower tension and relax.  Medicines Medicines for anxiety include: Antidepressant medicines. These are usually prescribed for long-term daily control. Anti-anxiety medicines. These may be added in severe cases, especially when panic attacks occur. When used together, medicines, psychotherapy, and tension-reduction techniques may be the most effective treatment. Relationships Relationships can play a big part in helping you recover. Spend more time connecting with trusted friends and family members. Think about going to couples counseling if you have a partner, taking family education classes, or going to family therapy. Therapy can help you and others better understand your anxiety. How to recognize changes in your anxiety Everyone responds differently to treatment for anxiety. Recovery from anxiety happens when symptoms lessen and stop interfering with your daily life at home or work. This may mean that you will start to: Have better concentration and focus. Worry will interfere less in your daily thinking. Sleep better. Be less irritable. Have more  energy. Have improved memory. Try to recognize when your condition is getting worse. Contact your provider if your symptoms interfere with home or work and you feel like your condition is not improving. Follow these instructions at home: Activity Exercise. Adults should: Exercise for at least 150 minutes each week. The exercise should increase your heart rate and make you sweat (moderate-intensity exercise). Do strengthening exercises at least twice a week. Get the right amount and quality of sleep. Most adults need 7-9 hours of sleep each night. Lifestyle  Eat a healthy diet that includes plenty of vegetables, fruits, whole grains, low-fat dairy products,  and lean protein. Do not eat a lot of foods that are high in fats, added sugars, or salt (sodium). Make choices that simplify your life. Do not use any products that contain nicotine or tobacco. These products include cigarettes, chewing tobacco, and vaping devices, such as e-cigarettes. If you need help quitting, ask your provider. Avoid caffeine, alcohol, and certain over-the-counter cold medicines. These may make you feel worse. Ask your pharmacist which medicines to avoid. General instructions Take over-the-counter and prescription medicines only as told by your provider. Keep all follow-up visits. This is to make sure you are managing your anxiety well or if you need more support. Where to find support You can get help and support from: Self-help groups. Online and [employer]. A trusted spiritual leader. Couples counseling. Family education classes. Family therapy. Where to find more information You may find that joining a support group helps you deal with your anxiety. The following sources can help you find counselors or support groups near you: Mental Health America: mentalhealthamerica.net Anxiety and Depression Association of [place] (ADAA): adaa.org The First American on Mental Illness (NAMI): nami.org Contact a health care provider if: You have a hard time staying focused or finishing tasks. You spend many hours a day feeling worried about everyday life. You are very tired because you cannot stop worrying. You start to have headaches or often feel tense. You have chronic nausea or diarrhea. Get help right away if: Your heart feels like it is racing. You have shortness of breath. You have thoughts of hurting yourself or others. Get help right away if you feel like you may hurt yourself or others, or have thoughts about taking your own life. Go to your nearest emergency room or: Call 911. Call the National Suicide Prevention Lifeline at 325 215 8099 or 988.  This is open 24 hours a day. Text the Crisis Text Line at 256-138-2364. This information is not intended to replace advice given to you by your health care provider. Make sure you discuss any questions you have with your health care provider. Document Revised: 01/20/2022 Document Reviewed: 08/04/2020 Elsevier Patient Education  2024 [employer].

## 2023-12-10 ENCOUNTER — Other Ambulatory Visit: Payer: Self-pay | Admitting: Family Medicine

## 2023-12-10 DIAGNOSIS — F418 Other specified anxiety disorders: Secondary | ICD-10-CM

## 2023-12-10 NOTE — Telephone Encounter (Signed)
 Virtual visit on July 24, continued on buspirone , sertraline , will order 74-month refills.

## 2024-01-17 ENCOUNTER — Ambulatory Visit (INDEPENDENT_AMBULATORY_CARE_PROVIDER_SITE_OTHER): Admitting: Family Medicine

## 2024-01-17 VITALS — BP 118/78 | HR 74 | Resp 16 | Ht 62.0 in | Wt 203.0 lb

## 2024-01-17 DIAGNOSIS — F418 Other specified anxiety disorders: Secondary | ICD-10-CM | POA: Diagnosis not present

## 2024-01-17 DIAGNOSIS — G932 Benign intracranial hypertension: Secondary | ICD-10-CM

## 2024-01-17 DIAGNOSIS — R002 Palpitations: Secondary | ICD-10-CM

## 2024-01-17 DIAGNOSIS — G4733 Obstructive sleep apnea (adult) (pediatric): Secondary | ICD-10-CM

## 2024-01-17 DIAGNOSIS — Z23 Encounter for immunization: Secondary | ICD-10-CM

## 2024-01-17 DIAGNOSIS — Z0000 Encounter for general adult medical examination without abnormal findings: Secondary | ICD-10-CM | POA: Diagnosis not present

## 2024-01-17 DIAGNOSIS — E876 Hypokalemia: Secondary | ICD-10-CM | POA: Diagnosis not present

## 2024-01-17 DIAGNOSIS — Z13220 Encounter for screening for lipoid disorders: Secondary | ICD-10-CM

## 2024-01-17 DIAGNOSIS — R7303 Prediabetes: Secondary | ICD-10-CM

## 2024-01-17 LAB — LIPID PANEL
Cholesterol: 165 mg/dL (ref 0–200)
HDL: 52.3 mg/dL (ref >39.00)
LDL Cholesterol: 100 mg/dL — ABNORMAL HIGH (ref 0–99)
NonHDL: 112.96
Total CHOL/HDL Ratio: 3
Triglycerides: 64 mg/dL (ref 0.0–149.0)
VLDL: 12.8 mg/dL (ref 0.0–40.0)

## 2024-01-17 LAB — CBC
HCT: 41.1 % (ref 36.0–46.0)
Hemoglobin: 13.2 g/dL (ref 12.0–15.0)
MCHC: 32.1 g/dL (ref 30.0–36.0)
MCV: 76.8 fl — ABNORMAL LOW (ref 78.0–100.0)
Platelets: 218 K/uL (ref 150.0–400.0)
RBC: 5.35 Mil/uL — ABNORMAL HIGH (ref 3.87–5.11)
RDW: 14.9 % (ref 11.5–15.5)
WBC: 10.3 K/uL (ref 4.0–10.5)

## 2024-01-17 LAB — COMPREHENSIVE METABOLIC PANEL WITH GFR
ALT: 25 U/L (ref 0–35)
AST: 23 U/L (ref 0–37)
Albumin: 4.3 g/dL (ref 3.5–5.2)
Alkaline Phosphatase: 65 U/L (ref 39–117)
BUN: 13 mg/dL (ref 6–23)
CO2: 28 meq/L (ref 19–32)
Calcium: 9 mg/dL (ref 8.4–10.5)
Chloride: 102 meq/L (ref 96–112)
Creatinine, Ser: 0.85 mg/dL (ref 0.40–1.20)
GFR: 86.77 mL/min (ref >60.00)
Glucose, Bld: 91 mg/dL (ref 70–99)
Potassium: 3.7 meq/L (ref 3.5–5.1)
Sodium: 137 meq/L (ref 135–145)
Total Bilirubin: 0.8 mg/dL (ref 0.2–1.2)
Total Protein: 6.9 g/dL (ref 6.0–8.3)

## 2024-01-17 LAB — HEMOGLOBIN A1C: Hgb A1c MFr Bld: 5.3 % (ref 4.6–6.5)

## 2024-01-17 LAB — TSH: TSH: 1.04 u[IU]/mL (ref 0.35–5.50)

## 2024-01-17 NOTE — Progress Notes (Unsigned)
 Subjective:  Patient ID: Brenda Molina, female    DOB: 07-17-1985  Age: 38 y.o. MRN: [id number]  CC:  Chief Complaint  Patient presents with   Annual Exam    HPI Brenda Molina presents for Annual Exam  PCP, [name], [name], appt 8/4. Mirena  IUD for contraception.no plans on pregnancy.  Neuro, [name], history of benign intracranial hypertension. Prior treated with Diamox . Stent placment in R transverse and sigmoid sinuses. Prior plavix , ASA. Baby aspirin  daily recommended. Not taking. Some bruising prior - not sure if from plavix  prior. Last neuro visit in 02/2021. 4 month follow up recommended.  Osa on cpap - ony intermittent use - not nightly - uncomfortable with mask. No recent neuro visit - mild OSA. She stopped taking diamox  altogether - made her feel nauseous. No new HA. Episodic HA only. No severe HA, rare blurry vision - has discussed with eye specialist. Ophthalmology, [name] - yearly visits.   Depression with anxiety Last discussed at virtual visit in July.  Had been off meds with worsening anxiety symptoms, started back on previous medication regimen with sertraline  50 mg daily, buspirone  7.5 mg twice daily at that time.  Planned continued follow-up with therapist as well. Only taking buspirone  at night, sertraline  at night.  Feels like anxiety is better.  Appt with therapist last week - monthly visits.   Hypokalemia Mild hypokalemia of 3.2 on ER visit in July, treated with oral repletion.  Has not had rechecked.  Labs were ordered just not performed. Rare palpitations - once per week - at sleep.   Lab Results  Component Value Date   NA 139 10/27/2023   K 3.2 (L) 10/27/2023   CL 108 10/27/2023   CO2 23 10/27/2023        01/17/2024    1:09 PM 11/18/2023    1:06 PM 09/04/2020    2:39 PM 06/21/2020    3:27 PM 05/22/2020    3:11 PM  Depression screen PHQ 2/9  Decreased Interest 1 3 1 1  0  Down, Depressed, Hopeless  3 1 1  0  PHQ - 2 Score 1 6 2 2  0   Altered sleeping  3 2 1    Tired, decreased energy 3 3 3 2    Change in appetite 3 3 3 1    Feeling bad or failure about yourself  1 3 1  0   Trouble concentrating 1 2 1 1    Moving slowly or fidgety/restless 0 0 0 0   Suicidal thoughts 0 0 0 0   PHQ-9 Score  20 12 7    Difficult doing work/chores Somewhat difficult Not difficult at all         01/17/2024    1:09 PM 11/18/2023    1:11 PM 09/04/2020    2:41 PM 06/21/2020    3:28 PM  GAD 7 : Generalized Anxiety Score  Nervous, Anxious, on Edge 2 3 2 1   Control/stop worrying 2 3 2 1   Worry too much - different things 2 3 2 1   Trouble relaxing 2 3 2 2   Restless 2 0 2 0  Easily annoyed or irritable 2 3 3 1   Afraid - awful might happen 2 3 1 1   Total GAD 7 Score 14 18 14 7   Anxiety Difficulty Somewhat difficult Extremely difficult        Health Maintenance  Topic Date Due   Hepatitis B Vaccines 19-59 Average Risk (1 of 3 - 19+ 3-dose series) Never done  HPV VACCINES (1 - 3-dose SCDM series) Never done   Cervical Cancer Screening (HPV/Pap Cotest)  01/18/2019   Influenza Vaccine  11/26/2023   COVID-19 Vaccine (3 - 2025-26 season) 12/27/2023   DTaP/Tdap/Td (3 - Td or Tdap) 05/01/2025   Hepatitis C Screening  Completed   HIV Screening  Completed   Pneumococcal Vaccine  Aged Out   Meningococcal B Vaccine  Aged Out  Pap testing with GYN as above.  No FH of Cancer.   Immunization History  Administered Date(s) Administered   Influenza, Seasonal, Injecte, Preservative Fre 01/24/2015   Influenza,inj,Quad PF,6+ Mos 01/17/2014, 12/29/2017, 05/09/2020   PFIZER(Purple Top)SARS-COV-2 Vaccination 07/25/2019, 08/16/2019   PPD Test 06/25/2011   Tdap 01/17/2014, 05/02/2015  Flu vaccine - today.    No results found.optho in January.   Dental: every 6 months  Alcohol: occasional, rare.   Tobacco: none  Exercise/obesity:  With history of prediabetes.  No current exercise.  Goals discussed with start low/go slow principle.  Body mass  index is 37.13 kg/m. Lab Results  Component Value Date   HGBA1C 5.8 (H) 06/21/2020      History Patient Active Problem List   Diagnosis Date Noted   Pulsatile tinnitus of right ear 05/08/2020   Perennial and seasonal allergic rhinitis 04/18/2018   Cough, persistent 04/18/2018   History of wheezing 04/18/2018   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   Normal vaginal delivery 06/06/2015   RhD negative 06/05/2015   Preterm premature rupture of membranes 06/05/2015   Past Medical History:  Diagnosis Date   Allergy     Anxiety    Asthma    seasonal- allergy    Depression    GERD (gastroesophageal reflux disease)    some times   HELLP syndrome    post parteum   PCOS (polycystic ovarian syndrome)    Sleep apnea    Past Surgical History:  Procedure Laterality Date   BRAIN SURGERY     EYE SURGERY     IR ANGIO INTRA EXTRACRAN SEL COM CAROTID INNOMINATE BILAT MOD SED  04/02/2020   IR ANGIO INTRA EXTRACRAN SEL COM CAROTID INNOMINATE UNI R MOD SED  05/08/2020   IR ANGIO VERTEBRAL SEL VERTEBRAL BILAT MOD SED  04/02/2020   IR CT HEAD LTD  05/08/2020   IR RADIOLOGIST EVAL & MGMT  02/28/2020   IR RADIOLOGIST EVAL & MGMT  05/29/2020   IR TRANSCATH PLC STENT  INITIAL VEIN  INC ANGIOPLASTY  05/08/2020   IR VENO/JUGULAR RIGHT  05/08/2020   RADIOLOGY WITH ANESTHESIA N/A 05/01/2020   Procedure: STENTING;  Surgeon: Dolphus Carrion, MD;  Location: [facility] OR;  Service: Radiology;  Laterality: N/A;   RADIOLOGY WITH ANESTHESIA N/A 05/08/2020   Procedure: STENT PLACEMENT;  Surgeon: Dolphus Carrion, MD;  Location: [facility] OR;  Service: Radiology;  Laterality: N/A;   WISDOM TOOTH EXTRACTION     No Known Allergies Prior to Admission medications   Medication Sig Start Date End Date Taking? Authorizing Provider  busPIRone  (BUSPAR ) 7.5 MG tablet TAKE 1 TABLET BY MOUTH 2 TIMES DAILY. 12/10/23  Yes Levora Reyes SAUNDERS, MD  levonorgestrel  (MIRENA , 52 MG,) 20 MCG/24HR IUD 1 each by Intrauterine route once.     Yes [provider]  sertraline  (ZOLOFT ) 50 MG tablet TAKE 1 TABLET BY MOUTH EVERY DAY 12/10/23  Yes Levora Reyes SAUNDERS, MD  acetaZOLAMIDE  (DIAMOX ) 500 MG capsule Take 2 pills twice per day Patient not taking: Reported on 01/17/2024 10/07/20   Athar, Saima, MD  ascorbic acid (VITAMIN C) 500 MG  tablet Take 500 mg by mouth daily. Patient not taking: Reported on 01/17/2024    [provider]  aspirin  325 MG tablet Take 325 mg by mouth daily. Patient not taking: Reported on 01/17/2024    [provider]  b complex vitamins capsule Take 1 capsule by mouth daily. Patient not taking: Reported on 01/17/2024    [provider]  cetirizine  (ZYRTEC ) 10 MG tablet Take 10 mg by mouth daily as needed for allergies. Patient not taking: Reported on 01/17/2024    [provider]  cyclobenzaprine  (FLEXERIL ) 10 MG tablet Take 1 tablet (10 mg total) by mouth 3 (three) times daily as needed for muscle spasms. Patient not taking: Reported on 01/17/2024 10/27/23   Raford Lenis, MD  fluticasone  (FLONASE ) 50 MCG/ACT nasal spray Place 2 sprays into both nostrils daily. Patient not taking: Reported on 01/17/2024 05/22/20   Levora Reyes SAUNDERS, MD  naproxen  (NAPROSYN ) 500 MG tablet Take 1 tablet (500 mg total) by mouth 2 (two) times daily. Patient not taking: Reported on 01/17/2024 10/27/23   Raford Lenis, MD  Polyethylene Glycol 400 (BLINK TEARS OP) Place 1 drop into both eyes daily as needed (dry eyes). Patient not taking: Reported on 01/17/2024    [provider]  potassium chloride  SA (KLOR-CON  M) 20 MEQ tablet Take 1 tablet (20 mEq total) by mouth 2 (two) times daily. Patient not taking: Reported on 01/17/2024 10/27/23   Raford Lenis, MD   Social History   Socioeconomic History   Marital status: Single    Spouse name: Not on file   Number of children: 1   Years of education: Not on file   Highest education level: Some college, no degree  Occupational History   Not on file   Tobacco Use   Smoking status: Never   Smokeless tobacco: Never   Tobacco comments:    Dont smoke  Vaping Use   Vaping status: Never Used  Substance and Sexual Activity   Alcohol use: Not Currently    Comment: Socially   Drug use: No   Sexual activity: Yes    Partners: Male    Birth control/protection: Condom, I.U.D.  Other Topics Concern   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 Has no children. 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   Social Drivers of Health   Financial Resource Strain: High Risk (01/17/2024)   Overall Financial Resource Strain (CARDIA)    Difficulty of Paying Living Expenses: Hard  Food Insecurity: No Food Insecurity (01/17/2024)   Hunger Vital Sign    Worried About Running Out of Food in the Last Year: Never true    Ran Out of Food in the Last Year: Never true  Transportation Needs: No Transportation Needs (01/17/2024)   PRAPARE - Administrator, Civil Service (Medical): No    Lack of Transportation (Non-Medical): No  Physical Activity: Insufficiently Active (01/17/2024)   Exercise Vital Sign    Days of Exercise per Week: 2 days    Minutes of Exercise per Session: 30 min  Stress: No Stress Concern Present (01/17/2024)   [employer] of Occupational Health - Occupational Stress Questionnaire    Feeling of Stress: Only a little  Social Connections: Socially Isolated (01/17/2024)   Social Connection and Isolation Panel    Frequency of Communication with Friends and Family: Once a week    Frequency of Social  Gatherings with Friends and Family: Never    Attends Religious Services: 1 to 4 times per year    Active Member of [employer] or Organizations: No    Attends Engineer, structural: Not on file    Marital Status: Never married  Intimate Partner Violence: Unknown (08/01/2021)   Received from [facility]   HITS    Physically Hurt: Not on file     Insult or Talk Down To: Not on file    Threaten Physical Harm: Not on file    Scream or Curse: Not on file    Review of Systems 13 point review of systems per patient health survey noted.  Negative other than as indicated above or in HPI.    Objective:   Vitals:   01/17/24 1259  BP: 118/78  Pulse: 74  Resp: 16  SpO2: 98%  Weight: 203 lb (92.1 kg)  Height: 5' 2 (1.575 m)   {Vitals History (Optional):23777}  Physical Exam Vitals reviewed.  Constitutional:      Appearance: She is well-developed.  HENT:     Head: Normocephalic and atraumatic.     Right Ear: External ear normal.     Left Ear: External ear normal.  Eyes:     Conjunctiva/sclera: Conjunctivae normal.     Pupils: Pupils are equal, round, and reactive to light.  Neck:     Thyroid : No thyromegaly.  Cardiovascular:     Rate and Rhythm: Normal rate and regular rhythm.     Heart sounds: Normal heart sounds. No murmur heard. Pulmonary:     Effort: Pulmonary effort is normal. No respiratory distress.     Breath sounds: Normal breath sounds. No wheezing.  Abdominal:     General: Bowel sounds are normal.     Palpations: Abdomen is soft.     Tenderness: There is no abdominal tenderness.  Musculoskeletal:        General: No tenderness. Normal range of motion.     Cervical back: Normal range of motion and neck supple.  Lymphadenopathy:     Cervical: No cervical adenopathy.  Skin:    General: Skin is warm and dry.     Findings: No rash.  Neurological:     Mental Status: She is alert and oriented to person, place, and time.  Psychiatric:        Behavior: Behavior normal.        Thought Content: Thought content normal.    EKG, sinus rhythm, rate 74, PR 160, QTc 430.  No acute ST or T wave changes appreciated.  Inverted T wave in lead III, up in II, aVF. No apparent significant changes compared to 10/27/2023 EKG.  Assessment & Plan:  CADE OLBERDING is a 38 y.o. female . Annual physical exam  - -anticipatory  guidance as below in AVS, screening labs above. Health maintenance items as above in HPI discussed/recommended as applicable.   Depression with anxiety  - Improved, continue same med regimen at this time with option of twice daily dosing of BuSpar .  Continue follow-up with therapist.  Hypokalemia - Plan: Comprehensive metabolic panel with GFR  - Repeat labs, adjust plan accordingly.  Palpitations - Plan: CBC, Comprehensive metabolic panel with GFR, TSH, EKG 12-Lead  - Infrequent, nocturnal symptoms, recommended initially restarting CPAP to see if that alleviates symptoms.  Check labs as above.  If recurrence of symptoms, recommended Zio patch monitoring and/or cardiology follow-up.  Idiopathic intracranial hypertension  - Off Diamox  as above, has not followed up  with neuro recently.  Advised her to call their office to follow-up and to discuss need for Diamox  as well as low-dose aspirin  that had been recommended previously.  OSA (obstructive sleep apnea)  - Restart CPAP recommended.  If intolerant, could consider other options or discussion with sleep specialist.  Not sure if dental device would be an option since she does have mild OSA  Needs flu shot - Plan: Flu vaccine trivalent PF, 6mos and older(Flulaval,Afluria,Fluarix,Fluzone)  Prediabetes - Plan: Hemoglobin A1c  - Check A1c and adjust plan accordingly  Screening for hyperlipidemia - Plan: Lipid panel   - No new meds at this time, check A1c and adjust plan accordingly  No orders of the defined types were placed in this encounter.  Patient Instructions  I do recommend calling his neurology for a follow-up as they had wanted to see you in approximately 4 months back in November 2022.  Let me know if they need a new referral but that should not be necessary.  I would definitely discuss with them the Diamox  and whether or not you should be back on that medication as well as any concerns regarding side effects of that medication.  You  may also need to restart a baby aspirin  once per day if that was a previous recommendation based on your prior surgery.  I would also like you to talk to your neurologist about the sleep apnea and difficulty using that device to see if there are alternate options.      Signed,   Reyes Pines, MD [facility], [facility] 01/17/24 1:24 PM

## 2024-01-17 NOTE — Patient Instructions (Addendum)
 I do recommend calling his neurology for a follow-up as they had wanted to see you in approximately 4 months back in November 2022.  Let me know if they need a new referral but that should not be necessary.  I would definitely discuss with them the Diamox  and whether or not you should be back on that medication as well as any concerns regarding side effects of that medication.  You may also need to restart a baby aspirin  once per day if that was a previous recommendation based on your prior surgery.  I would also like you to talk to your neurologist about the sleep apnea and difficulty using that device to see if there are alternate options. I will check labs today.   If increased anxiety, I do recommend returning to twice daily dosing of buspirone .   For heart palpitations I will check some labs today, but as we discussed, untreated sleep apnea sometimes can contribute to heart arrhythmias, potentially palpitations.  Try restarting your CPAP treatment, and if palpitations do not resolve, please follow-up with me to discuss further.  I am also checking a potassium level that was low previously to make sure that is not contributing.   Return to the clinic or go to the nearest emergency room if any of your symptoms worsen or new symptoms occur.  Thanks for coming in today and take care!  Preventive Care 40-22 Years Old, Female Preventive care refers to lifestyle choices and visits with your health care provider that can promote health and wellness. Preventive care visits are also called wellness exams. What can I expect for my preventive care visit? Counseling During your preventive care visit, your health care provider may ask about your: Medical history, including: Past medical problems. Family medical history. Pregnancy history. Current health, including: Menstrual cycle. Method of birth control. Emotional well-being. Home life and relationship well-being. Sexual activity and sexual  health. Lifestyle, including: Alcohol, nicotine or tobacco, and drug use. Access to firearms. Diet, exercise, and sleep habits. Work and work Astronomer. Sunscreen use. Safety issues such as seatbelt and bike helmet use. Physical exam Your health care provider may check your: Height and weight. These may be used to calculate your BMI (body mass index). BMI is a measurement that tells if you are at a healthy weight. Waist circumference. This measures the distance around your waistline. This measurement also tells if you are at a healthy weight and may help predict your risk of certain diseases, such as type 2 diabetes and high blood pressure. Heart rate and blood pressure. Body temperature. Skin for abnormal spots. What immunizations do I need?  Vaccines are usually given at various ages, according to a schedule. Your health care provider will recommend vaccines for you based on your age, medical history, and lifestyle or other factors, such as travel or where you work. What tests do I need? Screening Your health care provider may recommend screening tests for certain conditions. This may include: Pelvic exam and Pap test. Lipid and cholesterol levels. Diabetes screening. This is done by checking your blood sugar (glucose) after you have not eaten for a while (fasting). Hepatitis B test. Hepatitis C test. HIV (human immunodeficiency virus) test. STI (sexually transmitted infection) testing, if you are at risk. BRCA-related cancer screening. This may be done if you have a family history of breast, ovarian, tubal, or peritoneal cancers. Talk with your health care provider about your test results, treatment options, and if necessary, the need for more tests. Follow  these instructions at home: Eating and drinking  Eat a healthy diet that includes fresh fruits and vegetables, whole grains, lean protein, and low-fat dairy products. Take vitamin and mineral supplements as recommended by  your health care provider. Do not drink alcohol if: Your health care provider tells you not to drink. You are pregnant, may be pregnant, or are planning to become pregnant. If you drink alcohol: Limit how much you have to 0-1 drink a day. Know how much alcohol is in your drink. In the [place] one drink equals one 12 oz bottle of beer (355 mL), one 5 oz glass of wine (148 mL), or one 1 oz glass of hard liquor (44 mL). Lifestyle Brush your teeth every morning and night with fluoride toothpaste. Floss one time each day. Exercise for at least 30 minutes 5 or more days each week. Do not use any products that contain nicotine or tobacco. These products include cigarettes, chewing tobacco, and vaping devices, such as e-cigarettes. If you need help quitting, ask your health care provider. Do not use drugs. If you are sexually active, practice safe sex. Use a condom or other form of protection to prevent STIs. If you do not wish to become pregnant, use a form of birth control. If you plan to become pregnant, see your health care provider for a prepregnancy visit. Find healthy ways to manage stress, such as: Meditation, yoga, or listening to music. Journaling. Talking to a trusted person. Spending time with friends and family. Minimize exposure to UV radiation to reduce your risk of skin cancer. Safety Always wear your seat belt while driving or riding in a vehicle. Do not drive: If you have been drinking alcohol. Do not ride with someone who has been drinking. If you have been using any mind-altering substances or drugs. While texting. When you are tired or distracted. Wear a helmet and other protective equipment during sports activities. If you have firearms in your house, make sure you follow all gun safety procedures. Seek help if you have been physically or sexually abused. What's next? Go to your health care provider once a year for an annual wellness visit. Ask your health care provider  how often you should have your eyes and teeth checked. Stay up to date on all vaccines. This information is not intended to replace advice given to you by your health care provider. Make sure you discuss any questions you have with your health care provider. Document Revised: 10/09/2020 Document Reviewed: 10/09/2020 Elsevier Patient Education  2024 Elsevier Inc.  Palpitations Palpitations are feelings that your heartbeat is irregular or is faster than normal. It may feel like your heart is fluttering or skipping a beat. Palpitations may be caused by many things, including smoking, caffeine, alcohol, stress, and certain medicines or drugs. Most causes of palpitations are not serious.  However, some palpitations can be a sign of a serious problem. Further tests and a thorough medical history will be done to find the cause of your palpitations. Your provider may order tests such as an ECG, labs, an echocardiogram, or an ambulatory continuous ECG monitor. Follow these instructions at home: Pay attention to any changes in your symptoms. Let your health care provider know about them. Take these actions to help manage your symptoms: Eating and drinking Follow instructions from your health care provider about eating or drinking restrictions. You may need to avoid foods and drinks that may cause palpitations. These may include: Caffeinated coffee, tea, soft drinks, and energy drinks. Chocolate.  Alcohol. Diet pills. Lifestyle     Take steps to reduce your stress and anxiety. Things that can help you relax include: Yoga. Mind-body activities, such as deep breathing, meditation, or using words and images to create positive thoughts (guided imagery). Physical activity, such as swimming, jogging, or walking. Tell your health care provider if your palpitations increase with activity. If you have chest pain or shortness of breath with activity, do not continue the activity until you are seen by your health  care provider. Biofeedback. This is a method that helps you learn to use your mind to control things in your body, such as your heartbeat. Get plenty of rest and sleep. Keep a regular bed time. Do not use drugs, including cocaine or ecstasy. Do not use marijuana. Do not use any products that contain nicotine or tobacco. These products include cigarettes, chewing tobacco, and vaping devices, such as e-cigarettes. If you need help quitting, ask your health care provider. General instructions Take over-the-counter and prescription medicines only as told by your health care provider. Keep all follow-up visits. This is important. These may include visits for further testing if palpitations do not go away or get worse. Contact a health care provider if: You continue to have a fast or irregular heartbeat for a long period of time. You notice that your palpitations occur more often. Get help right away if: You have chest pain or shortness of breath. You have a severe headache. You feel dizzy or you faint. These symptoms may represent a serious problem that is an emergency. Do not wait to see if the symptoms will go away. Get medical help right away. Call your local emergency services (911 in the [place] Do not drive yourself to the hospital. Summary Palpitations are feelings that your heartbeat is irregular or is faster than normal. It may feel like your heart is fluttering or skipping a beat. Palpitations may be caused by many things, including smoking, caffeine, alcohol, stress, certain medicines, and drugs. Further tests and a thorough medical history may be done to find the cause of your palpitations. Get help right away if you faint or have chest pain, shortness of breath, severe headache, or dizziness. This information is not intended to replace advice given to you by your health care provider. Make sure you discuss any questions you have with your health care provider. Document Revised:  09/04/2020 Document Reviewed: 09/04/2020 Elsevier Patient Education  2024 [employer].

## 2024-01-21 ENCOUNTER — Ambulatory Visit: Payer: Self-pay | Admitting: Family Medicine

## 2024-04-17 ENCOUNTER — Ambulatory Visit: Admitting: Family Medicine

## 2024-05-04 ENCOUNTER — Ambulatory Visit: Admitting: Family Medicine

## 2024-05-17 ENCOUNTER — Ambulatory Visit: Admitting: Family Medicine

## 2024-05-17 VITALS — BP 118/76 | HR 96 | Temp 98.4°F | Resp 16 | Ht 62.0 in | Wt 235.8 lb

## 2024-05-17 DIAGNOSIS — F418 Other specified anxiety disorders: Secondary | ICD-10-CM | POA: Diagnosis not present

## 2024-05-17 DIAGNOSIS — Z23 Encounter for immunization: Secondary | ICD-10-CM | POA: Diagnosis not present

## 2024-05-17 DIAGNOSIS — G4733 Obstructive sleep apnea (adult) (pediatric): Secondary | ICD-10-CM | POA: Diagnosis not present

## 2024-05-17 DIAGNOSIS — R0981 Nasal congestion: Secondary | ICD-10-CM | POA: Diagnosis not present

## 2024-05-17 MED ORDER — BUSPIRONE HCL 10 MG PO TABS: 10.0000 mg | ORAL_TABLET | Freq: Two times a day (BID) | ORAL | 2 refills | Status: AC

## 2024-05-17 NOTE — Progress Notes (Signed)
 "  Subjective:  Patient ID: Brenda Molina, female    DOB: 1986-01-16  Age: 39 y.o. MRN: [id number]  CC:  Chief Complaint  Patient presents with   Follow-up    3 month follow up. Patient reports she is doing fine.     HPI [name] presents for   Obstructive sleep apnea Intermittent use last visit.  Consistent use recommended or follow-up with sleep specialist.  Also with history of idiopathic intracranial hypertension and prior use of Diamox , follow-up recommended with her neurologist to decide if that need to be restarted along with aspirin  from previous recommendations.  We also discussed palpitations last visit and follow-up recommended if this persisted in spite of using CPAP.  On cpap more - 3 times per week - better than before.  On asa, not diamox , no further HA or blurry vision.  Some sinus drainage at times past 2 weeks.  no fever. Saline ns at times, on zyrtec . Flonase  in past - not recent.  No further heart palpitations recenlty.   Depression with anxiety Last discussed in September.  Taking buspirone  7.5 mg at night, sertraline  50 mg also at night at that time and felt like anxiety was better, monthly visits with therapist.  Continued on same with option of twice daily dosing of BuSpar .  Still meeting with therapist - going good.  On buspar  BID, sertraline  once per day. Feels better but still some anxiety, overthinking. Has discussed with therapist - working on techniques.  Had a promotion. Would like to adjust meds - buspar  again.      05/17/2024    1:39 PM 01/17/2024    1:09 PM 11/18/2023    1:06 PM 09/04/2020    2:39 PM 06/21/2020    3:27 PM  Depression screen PHQ 2/9  Decreased Interest 1 1 3 1 1   Down, Depressed, Hopeless 1  3 1 1   PHQ - 2 Score 2 1 6 2 2   Altered sleeping 1  3 2 1   Tired, decreased energy 2 3 3 3 2   Change in appetite 2 3 3 3 1   Feeling bad or failure about yourself  1 1 3 1  0  Trouble concentrating 1 1 2 1 1   Moving slowly or  fidgety/restless 0 0 0 0 0  Suicidal thoughts 0 0 0 0 0  PHQ-9 Score 9  20  12  7    Difficult doing work/chores Somewhat difficult Somewhat difficult Not difficult at all       Data saved with a previous flowsheet row definition      05/17/2024    1:40 PM 01/17/2024    1:09 PM 11/18/2023    1:11 PM 09/04/2020    2:41 PM  GAD 7 : Generalized Anxiety Score  Nervous, Anxious, on Edge 1 2  3  2    Control/stop worrying 2 2  3  2    Worry too much - different things 3 2  3  2    Trouble relaxing 2 2  3  2    Restless 1 2  0  2   Easily annoyed or irritable 1 2  3  3    Afraid - awful might happen 1 2  3  1    Total GAD 7 Score 11 14 18 14   Anxiety Difficulty Somewhat difficult Somewhat difficult Extremely difficult      Data saved with a previous flowsheet row definition      History Patient Active Problem List   Diagnosis Date Noted  Pulsatile tinnitus of right ear 05/08/2020   Perennial and seasonal allergic rhinitis 04/18/2018   Cough, persistent 04/18/2018   History of wheezing 04/18/2018   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   Normal vaginal delivery 06/06/2015   RhD negative 06/05/2015   Preterm premature rupture of membranes 06/05/2015   Past Medical History:  Diagnosis Date   Allergy     Anxiety    Asthma    seasonal- allergy    Depression    GERD (gastroesophageal reflux disease)    some times   HELLP syndrome    post parteum   PCOS (polycystic ovarian syndrome)    Sleep apnea    Past Surgical History:  Procedure Laterality Date   BRAIN SURGERY     EYE SURGERY     IR ANGIO INTRA EXTRACRAN SEL COM CAROTID INNOMINATE BILAT MOD SED  04/02/2020   IR ANGIO INTRA EXTRACRAN SEL COM CAROTID INNOMINATE UNI R MOD SED  05/08/2020   IR ANGIO VERTEBRAL SEL VERTEBRAL BILAT MOD SED  04/02/2020   IR CT HEAD LTD  05/08/2020   IR RADIOLOGIST EVAL & MGMT  02/28/2020   IR RADIOLOGIST EVAL & MGMT  05/29/2020   IR TRANSCATH PLC STENT  INITIAL VEIN  INC ANGIOPLASTY  05/08/2020   IR  VENO/JUGULAR RIGHT  05/08/2020   RADIOLOGY WITH ANESTHESIA N/A 05/01/2020   Procedure: STENTING;  Surgeon: Dolphus Carrion, MD;  Location: [facility] OR;  Service: Radiology;  Laterality: N/A;   RADIOLOGY WITH ANESTHESIA N/A 05/08/2020   Procedure: STENT PLACEMENT;  Surgeon: Dolphus Carrion, MD;  Location: [facility] OR;  Service: Radiology;  Laterality: N/A;   WISDOM TOOTH EXTRACTION     Allergies[1] Prior to Admission medications  Medication Sig Start Date End Date Taking? Authorizing Provider  ascorbic acid (VITAMIN C) 500 MG tablet Take 500 mg by mouth daily.   Yes [provider]  aspirin  325 MG tablet Take 325 mg by mouth daily.   Yes [provider]  b complex vitamins capsule Take 1 capsule by mouth daily.   Yes [provider]  busPIRone  (BUSPAR ) 7.5 MG tablet TAKE 1 TABLET BY MOUTH 2 TIMES DAILY. 12/10/23  Yes Levora Reyes SAUNDERS, MD  cetirizine  (ZYRTEC ) 10 MG tablet Take 10 mg by mouth daily as needed for allergies.   Yes [provider]  levonorgestrel  (MIRENA , 52 MG,) 20 MCG/24HR IUD 1 each by Intrauterine route once.    Yes [provider]  Polyethylene Glycol 400 (BLINK TEARS OP) Place 1 drop into both eyes daily as needed (dry eyes).   Yes [provider]  sertraline  (ZOLOFT ) 50 MG tablet TAKE 1 TABLET BY MOUTH EVERY DAY 12/10/23  Yes Levora Reyes SAUNDERS, MD  acetaZOLAMIDE  (DIAMOX ) 500 MG capsule Take 2 pills twice per day Patient not taking: Reported on 01/17/2024 10/07/20   Athar, Saima, MD  cyclobenzaprine  (FLEXERIL ) 10 MG tablet Take 1 tablet (10 mg total) by mouth 3 (three) times daily as needed for muscle spasms. Patient not taking: Reported on 01/17/2024 10/27/23   Raford Lenis, MD  fluticasone  (FLONASE ) 50 MCG/ACT nasal spray Place 2 sprays into both nostrils daily. Patient not taking: Reported on 05/17/2024 05/22/20   Levora Reyes SAUNDERS, MD  naproxen  (NAPROSYN ) 500 MG tablet Take 1 tablet (500 mg total) by mouth 2 (two) times  daily. Patient not taking: Reported on 01/17/2024 10/27/23   Raford Lenis, MD  potassium chloride  SA (KLOR-CON  M) 20 MEQ tablet Take 1 tablet (20 mEq total) by mouth 2 (two) times daily. Patient not  taking: Reported on 01/17/2024 10/27/23   Raford Lenis, MD   Social History   Socioeconomic History   Marital status: Single    Spouse name: Not on file   Number of children: 1   Years of education: Not on file   Highest education level: Some college, no degree  Occupational History   Not on file  Tobacco Use   Smoking status: Never   Smokeless tobacco: Never   Tobacco comments:    Dont smoke  Vaping Use   Vaping status: Never Used  Substance and Sexual Activity   Alcohol use: Not Currently    Comment: Socially   Drug use: No   Sexual activity: Yes    Partners: Male    Birth control/protection: Condom, I.U.D.  Other Topics Concern   Not on file  Social History Narrative   Patient works for [employer] as a occupational psychologist. She is single. Has no children. 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   Social Drivers of Health   Tobacco Use: Low Risk (05/17/2024)   Patient History    Smoking Tobacco Use: Never    Smokeless Tobacco Use: Never    Passive Exposure: Not on file  Financial Resource Strain: Medium Risk (05/17/2024)   Overall Financial Resource Strain (CARDIA)    Difficulty of Paying Living Expenses: Somewhat hard  Food Insecurity: No Food Insecurity (05/17/2024)   Epic    Worried About Radiation Protection Practitioner of Food in the Last Year: Never true    Ran Out of Food in the Last Year: Never true  Transportation Needs: No Transportation Needs (05/17/2024)   Epic    Lack of Transportation (Medical): No    Lack of Transportation (Non-Medical): No  Physical Activity: Insufficiently Active (05/17/2024)   Exercise Vital Sign    Days of Exercise per Week: 1 day    Minutes of Exercise per Session: 30 min  Stress: Stress Concern Present (05/17/2024)   [employer] of  Occupational Health - Occupational Stress Questionnaire    Feeling of Stress: Rather much  Social Connections: Unknown (05/17/2024)   Social Connection and Isolation Panel    Frequency of Communication with Friends and Family: Twice a week    Frequency of Social Gatherings with Friends and Family: Not on file    Attends Religious Services: 1 to 4 times per year    Active Member of [employer] or Organizations: Yes    Attends Banker Meetings: Never    Marital Status: Never married  Intimate Partner Violence: Unknown (08/01/2021)   Received from [facility]   HITS    Physically Hurt: Not on file    Insult or Talk Down To: Not on file    Threaten Physical Harm: Not on file    Scream or Curse: Not on file  Depression (PHQ2-9): Medium Risk (05/17/2024)   Depression (PHQ2-9)    PHQ-2 Score: 9  Alcohol Screen: Low Risk (01/17/2024)   Alcohol Screen    Last Alcohol Screening Score (AUDIT): 1  Housing: Low Risk (05/17/2024)   Epic    Unable to Pay for Housing in the Last Year: No    Number of Times Moved in the Last Year: 0    Homeless in the Last Year: No  Utilities: Not on file  Health Literacy: Not on file    Review of Systems Per HPI.   Objective:   Vitals:   05/17/24 1306  BP: 118/76  Pulse: 96  Resp:  16  Temp: 98.4 F (36.9 C)  TempSrc: Temporal  SpO2: 96%  Weight: 235 lb 12.8 oz (107 kg)  Height: 5' 2 (1.575 m)     Physical Exam Vitals reviewed.  Constitutional:      Appearance: Normal appearance. She is well-developed.  HENT:     Head: Normocephalic and atraumatic.     Nose: Congestion (Minimal.) present. No rhinorrhea.     Mouth/Throat:     Mouth: Mucous membranes are moist.     Pharynx: Posterior oropharyngeal erythema present. No oropharyngeal exudate.  Eyes:     Conjunctiva/sclera: Conjunctivae normal.     Pupils: Pupils are equal, round, and reactive to light.  Neck:     Vascular: No carotid bruit.  Cardiovascular:     Rate and Rhythm:  Normal rate and regular rhythm.     Heart sounds: Normal heart sounds.  Pulmonary:     Effort: No respiratory distress.     Breath sounds: Normal breath sounds. No wheezing or rhonchi.  Abdominal:     Palpations: Abdomen is soft. There is no pulsatile mass.     Tenderness: There is no abdominal tenderness.  Musculoskeletal:     Right lower leg: No edema.     Left lower leg: No edema.  Skin:    General: Skin is warm and dry.  Neurological:     Mental Status: She is alert and oriented to person, place, and time.  Psychiatric:        Mood and Affect: Mood normal.        Behavior: Behavior normal.        Assessment & Plan:  MECHILLE VARGHESE is a 39 y.o. female . OSA (obstructive sleep apnea)  - Improved adherence but still room to improve further with nightly use recommended.  Palpitations have improved.  We did discuss that some intermittent fatigue, and potentially similar mood symptoms may improve with consistent use of CPAP.  I did recommend that she follow-up with neurology as we discussed last visit with history of new pathic intracranial hypertension and can discuss the aspirin  and Diamox  further with neuro.  Off Diamox  due to nausea.  Denies new headache or, blurry vision or other new symptoms at this time  Need for influenza vaccination - Plan: Flu vaccine trivalent PF, 6mos and older(Flulaval,Afluria,Fluarix,Fluzone)  Nasal congestion  - Allergic versus recent viral illness, no fever, reassuring exam.  Saline nasal spray over-the-counter is fine,  can restart Flonase  to see if that is helpful with RTC precautions given.  Depression with anxiety - Plan: busPIRone  (BUSPAR ) 10 MG tablet  - Some improvement but still with some persistent worry, anxiety.  Has noticed some improvement with twice daily dosing of BuSpar , would like to try higher dose.  Will increase to 10 mg twice daily, continue same dose sertraline , watch for any new side effects and recheck in 3 months.  Meds  ordered this encounter  Medications   busPIRone  (BUSPAR ) 10 MG tablet    Sig: Take 1 tablet (10 mg total) by mouth 2 (two) times daily.    Dispense:  60 tablet    Refill:  2   Patient Instructions  Thank you for coming in today.  Since the BuSpar  has been helpful I think it would be reasonable to slightly increase the dose to 10 mg twice per day.  If any new side effects on that dose let me know.  No change on other medications.  Try to use CPAP nightly as that  may improve any fatigue, quality of sleep and potentially may help with managing anxiety/depressive symptoms as well.  Please call neurology for follow-up.  They can decide if additional testing needed or recommendations regarding the Diamox .  Glad to hear the headaches and heart palpitations have resolved.  Try the Flonase  nasal spray for the congestion.  Could be allergic component or possible recent virus that should improve with some time.  Keep up the good work with the saline nasal spray.  If not improving or any worsening of symptoms to be seen.  If there are any concerns on your bloodwork, I will let you know. Take care!     Signed,   Reyes Pines, MD [facility], [facility] 05/17/24 2:02 PM      [1] No Known Allergies  "

## 2024-05-17 NOTE — Patient Instructions (Addendum)
 Thank you for coming in today.  Since the BuSpar  has been helpful I think it would be reasonable to slightly increase the dose to 10 mg twice per day.  If any new side effects on that dose let me know.  No change on other medications.  Try to use CPAP nightly as that may improve any fatigue, quality of sleep and potentially may help with managing anxiety/depressive symptoms as well.  Please call neurology for follow-up.  They can decide if additional testing needed or recommendations regarding the Diamox .  Glad to hear the headaches and heart palpitations have resolved.  Try the Flonase  nasal spray for the congestion.  Could be allergic component or possible recent virus that should improve with some time.  Keep up the good work with the saline nasal spray.  If not improving or any worsening of symptoms to be seen.  If there are any concerns on your bloodwork, I will let you know. Take care!

## 2024-05-25 ENCOUNTER — Telehealth: Payer: Self-pay

## 2024-05-25 MED ORDER — FLUTICASONE PROPIONATE 50 MCG/ACT NA SUSP: 2.0000 | Freq: Every day | NASAL | 6 refills | Status: AC

## 2024-05-25 NOTE — Telephone Encounter (Signed)
 Copied from CRM #[id number]. Topic: Clinical - Medication Refill >> May 25, 2024 10:59 AM Drema MATSU wrote: Medication: fluticasone  (FLONASE ) 50 MCG/ACT nasal spray sinus is draining and cough up clear substance  Has the patient contacted their pharmacy? No (Agent: If no, request that the patient contact the pharmacy for the refill. If patient does not wish to contact the pharmacy document the reason why and proceed with request.) (Agent: If yes, when and what did the pharmacy advise?)  This is the patient's preferred pharmacy:  [id number] - [place], [place] - [address] AT [facility] [address] [place] [address] Phone: [phone] Fax: [phone]  Is this the correct pharmacy for this prescription? Yes If no, delete pharmacy and type the correct one.   Has the prescription been filled recently? No  Is the patient out of the medication? Yes  Has the patient been seen for an appointment in the last year OR does the patient have an upcoming appointment? Yes  Can we respond through MyChart? Yes  Agent: Please be advised that Rx refills may take up to 3 business days. We ask that you follow-up with your pharmacy.

## 2024-08-23 ENCOUNTER — Ambulatory Visit: Admitting: Family Medicine

## 2025-01-17 ENCOUNTER — Encounter: Admitting: Family Medicine
